# Patient Record
Sex: Male | Born: 1940
Health system: Southern US, Community
[De-identification: ages and names within clinical notes are randomized; demographics above are authoritative.]

## PROBLEM LIST (undated history)

## (undated) DIAGNOSIS — M5136 Other intervertebral disc degeneration, lumbar region: Secondary | ICD-10-CM

## (undated) DIAGNOSIS — F419 Anxiety disorder, unspecified: Secondary | ICD-10-CM

## (undated) DIAGNOSIS — E039 Hypothyroidism, unspecified: Secondary | ICD-10-CM

## (undated) DIAGNOSIS — I10 Essential (primary) hypertension: Secondary | ICD-10-CM

## (undated) DIAGNOSIS — K219 Gastro-esophageal reflux disease without esophagitis: Secondary | ICD-10-CM

## (undated) DIAGNOSIS — T4145XA Adverse effect of unspecified anesthetic, initial encounter: Secondary | ICD-10-CM

## (undated) DIAGNOSIS — F329 Major depressive disorder, single episode, unspecified: Secondary | ICD-10-CM

## (undated) DIAGNOSIS — R319 Hematuria, unspecified: Secondary | ICD-10-CM

## (undated) DIAGNOSIS — E785 Hyperlipidemia, unspecified: Secondary | ICD-10-CM

## (undated) DIAGNOSIS — R200 Anesthesia of skin: Secondary | ICD-10-CM

## (undated) DIAGNOSIS — F32A Depression, unspecified: Secondary | ICD-10-CM

## (undated) DIAGNOSIS — I8390 Asymptomatic varicose veins of unspecified lower extremity: Secondary | ICD-10-CM

## (undated) DIAGNOSIS — Z9109 Other allergy status, other than to drugs and biological substances: Secondary | ICD-10-CM

## (undated) DIAGNOSIS — Z8546 Personal history of malignant neoplasm of prostate: Secondary | ICD-10-CM

## (undated) DIAGNOSIS — M1990 Unspecified osteoarthritis, unspecified site: Secondary | ICD-10-CM

## (undated) DIAGNOSIS — Z85828 Personal history of other malignant neoplasm of skin: Secondary | ICD-10-CM

## (undated) DIAGNOSIS — H269 Unspecified cataract: Secondary | ICD-10-CM

## (undated) DIAGNOSIS — T8859XA Other complications of anesthesia, initial encounter: Secondary | ICD-10-CM

## (undated) DIAGNOSIS — R3915 Urgency of urination: Secondary | ICD-10-CM

## (undated) DIAGNOSIS — Z8551 Personal history of malignant neoplasm of bladder: Secondary | ICD-10-CM

## (undated) DIAGNOSIS — R351 Nocturia: Secondary | ICD-10-CM

## (undated) DIAGNOSIS — M419 Scoliosis, unspecified: Secondary | ICD-10-CM

## (undated) DIAGNOSIS — Z8719 Personal history of other diseases of the digestive system: Secondary | ICD-10-CM

## (undated) DIAGNOSIS — IMO0002 Reserved for concepts with insufficient information to code with codable children: Secondary | ICD-10-CM

## (undated) DIAGNOSIS — C61 Malignant neoplasm of prostate: Secondary | ICD-10-CM

## (undated) DIAGNOSIS — M51369 Other intervertebral disc degeneration, lumbar region without mention of lumbar back pain or lower extremity pain: Secondary | ICD-10-CM

## (undated) DIAGNOSIS — C4491 Basal cell carcinoma of skin, unspecified: Secondary | ICD-10-CM

## (undated) HISTORY — PX: EYE SURGERY: SHX253

## (undated) HISTORY — PX: HERNIA REPAIR: SHX51

## (undated) HISTORY — DX: Hypothyroidism, unspecified: E03.9

## (undated) HISTORY — PX: CATARACT EXTRACTION: SUR2

## (undated) HISTORY — PX: TONSILLECTOMY: SUR1361

## (undated) HISTORY — DX: Other allergy status, other than to drugs and biological substances: Z91.09

## (undated) HISTORY — PX: MASS BIOPSY: SHX5445

---
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
## 1979-06-16 HISTORY — PX: FOOT SURGERY: SHX648

## 1998-08-11 ENCOUNTER — Ambulatory Visit (HOSPITAL_COMMUNITY): Admission: RE | Admit: 1998-08-11 | Discharge: 1998-08-11 | Payer: Self-pay | Admitting: Internal Medicine

## 1998-08-11 ENCOUNTER — Encounter: Payer: Self-pay | Admitting: Internal Medicine

## 2002-10-15 DIAGNOSIS — C4491 Basal cell carcinoma of skin, unspecified: Secondary | ICD-10-CM

## 2002-10-15 HISTORY — PX: SKIN CANCER DESTRUCTION: SHX778

## 2002-10-15 HISTORY — DX: Basal cell carcinoma of skin, unspecified: C44.91

## 2002-10-15 HISTORY — PX: ESOPHAGOGASTRODUODENOSCOPY (EGD) WITH ESOPHAGEAL DILATION: SHX5812

## 2003-08-06 ENCOUNTER — Ambulatory Visit (HOSPITAL_COMMUNITY): Admission: RE | Admit: 2003-08-06 | Discharge: 2003-08-06 | Payer: Self-pay | Admitting: Internal Medicine

## 2003-08-06 ENCOUNTER — Encounter: Payer: Self-pay | Admitting: Internal Medicine

## 2005-03-05 ENCOUNTER — Ambulatory Visit: Payer: Self-pay | Admitting: Internal Medicine

## 2006-10-15 DIAGNOSIS — IMO0002 Reserved for concepts with insufficient information to code with codable children: Secondary | ICD-10-CM

## 2006-10-15 HISTORY — DX: Reserved for concepts with insufficient information to code with codable children: IMO0002

## 2007-02-25 ENCOUNTER — Ambulatory Visit: Payer: Self-pay | Admitting: Internal Medicine

## 2007-04-10 ENCOUNTER — Encounter: Payer: Self-pay | Admitting: Internal Medicine

## 2007-04-10 ENCOUNTER — Ambulatory Visit: Payer: Self-pay | Admitting: Internal Medicine

## 2008-08-31 ENCOUNTER — Ambulatory Visit: Admission: RE | Admit: 2008-08-31 | Discharge: 2008-10-12 | Payer: Self-pay | Admitting: Radiation Oncology

## 2008-10-15 HISTORY — PX: PROSTATECTOMY: SHX69

## 2008-10-25 ENCOUNTER — Inpatient Hospital Stay (HOSPITAL_COMMUNITY): Admission: RE | Admit: 2008-10-25 | Discharge: 2008-10-26 | Payer: Self-pay | Admitting: Urology

## 2008-10-25 ENCOUNTER — Encounter (INDEPENDENT_AMBULATORY_CARE_PROVIDER_SITE_OTHER): Payer: Self-pay | Admitting: Urology

## 2009-08-16 ENCOUNTER — Ambulatory Visit: Payer: Self-pay | Admitting: Vascular Surgery

## 2009-10-15 HISTORY — PX: VEIN SURGERY: SHX48

## 2009-10-19 ENCOUNTER — Ambulatory Visit: Payer: Self-pay | Admitting: Vascular Surgery

## 2009-12-16 ENCOUNTER — Ambulatory Visit: Payer: Self-pay | Admitting: Vascular Surgery

## 2010-01-04 ENCOUNTER — Ambulatory Visit: Payer: Self-pay | Admitting: Vascular Surgery

## 2010-01-11 ENCOUNTER — Ambulatory Visit: Payer: Self-pay | Admitting: Vascular Surgery

## 2010-07-24 ENCOUNTER — Ambulatory Visit: Payer: Self-pay | Admitting: Vascular Surgery

## 2011-01-29 LAB — BASIC METABOLIC PANEL
BUN: 10 mg/dL (ref 6–23)
CO2: 30 mEq/L (ref 19–32)
Calcium: 9.1 mg/dL (ref 8.4–10.5)
Chloride: 96 mEq/L (ref 96–112)
Creatinine, Ser: 0.98 mg/dL (ref 0.4–1.5)
GFR calc Af Amer: >60 mL/min (ref >60)
GFR calc non Af Amer: >60 mL/min (ref >60)
Glucose, Bld: 66 mg/dL — ABNORMAL LOW (ref 70–99)
Potassium: 4 mEq/L (ref 3.5–5.1)
Sodium: 134 mEq/L — ABNORMAL LOW (ref 135–145)

## 2011-01-29 LAB — HEMOGLOBIN AND HEMATOCRIT, BLOOD
HCT: 35.8 % — ABNORMAL LOW (ref 39.0–52.0)
HCT: 40.1 % (ref 39.0–52.0)
Hemoglobin: 12.2 g/dL — ABNORMAL LOW (ref 13.0–17.0)
Hemoglobin: 13.8 g/dL (ref 13.0–17.0)

## 2011-02-27 NOTE — Consult Note (Signed)
NEW PATIENT CONSULTATION   Ronald Lowery, Ronald Lowery  DOB:  March 16, 1941                                       10/19/2009  ZOXWR#:[id number]   The patient presents today for evaluation of venous hypertension.  He is  a very pleasant, active, healthy 70 year old gentleman with  progressively severe discomfort from his knees distally bilaterally.  He  reports this occurs towards the end of the day especially with prolonged  standing and prolonged sitting.  He has had a long history of extensive  varicose veins, more so on his left leg than his right leg and reports  that he does have some discomfort over this as well.  He had an episode  of superficial thrombophlebitis over the medial aspect of his knee  approximately 6 weeks ago and this is resolving.  He reports that the  pain is somewhat relieved with elevation and is worse with prolonged  standing.  It is knees both down.   PAST MEDICAL HISTORY:  Negative for any major medical difficulties,  specifically no diabetes, hypertension or cardiac disease.   FAMILY HISTORY:  Positive for varicosities in his mother who had prior  surgical treatment.  She is 68 years old.   SOCIAL HISTORY:  He is married with two children.  He is a retired  Airline pilot.  He does not smoke cigarettes and does not drink alcohol.   REVIEW OF SYSTEMS:  He does have a change in weight or loss of appetite.  His weight is 170 pounds.  He has 6 feet 3 inches tall.  No cardiac or  pulmonary dysfunction.  GI is positive for reflux, hiatal hernia.  GU  did have robotic prostate resection a year ago.  Vascular is no report  of pain from his legs distally.  Neurologic negative.  Ortho is positive  for arthritic joint pain.  Psychiatric is positive for depression.  HEENT is negative.  Heme is negative.   PHYSICAL EXAM:  A well-developed thin white male appearing stated age.  His blood pressure is 124/79, pulse 59, respirations 18. His radial and  dorsalis  pedis pulse are 2+ bilaterally.  HEENT is atraumatic,  normocephalic.  Extraocular movements are intact.  He does have no  evidence of skin changes over the lower extremities, specifically no  rash.  He does have marked varicose veins bilaterally, more so on the  left leg from the knee distally.  He does not have any evidence of  venous ulcers.   He underwent venous duplex in our office and this revealed reflux  throughout his saphenous vein bilaterally on images with handheld  Doppler.  He does have flow in his great saphenous vein into the large  varicosities at the level of his left knee.  Fortunately his deep system  has no evidence of reflux.  He has been wearing compression garments for  approximately 6 weeks.  He reports some degree of difficulty in wearing  these and placing these but he does report some improvement in his calf.  I discussed options with the patient.  I have recommended that he  continue with his compression garments.  We plan to see him again in 6  weeks for continued followup.  I did discuss the option of laser  ablation of his refluxing great saphenous vein and stab phlebectomy of  tributary varicosities for  relief of symptoms.  He understands.  We will  discuss this further with him in the future.  Plan to see him again in 6  weeks.     Larina Earthly, M.D.  Electronically Signed   TFE/MEDQ  D:  10/19/2009  T:  10/20/2009  Job:  [id number]   cc:   Geoffry Paradise, M.D.

## 2011-02-27 NOTE — Assessment & Plan Note (Signed)
OFFICE VISIT   Ronald Lowery, Ronald Lowery  DOB:  1940/10/31                                       01/11/2010  ZOXWR#:[id number]   The patient presents today for followup of his laser ablation of left  great saphenous vein with stab phlebectomy of multiple tributary  varicosities throughout his thigh, calf and ankle.  He did extremely  well with the procedure and has had minimal discomfort associated with  this.  He does have mild bruising as anticipated and all his phlebectomy  sites look quite good.  He underwent repeat venous duplex today and this  confirms closure of his saphenous vein from his knee to saphenofemoral  junction and no evidence of DVT.  I am quite pleased with his initial  result as is the patient and we plan to see him again on an as-needed  basis.     Larina Earthly, M.D.  Electronically Signed   TFE/MEDQ  D:  01/11/2010  T:  01/12/2010  Job:  [id number]   cc:   Geoffry Paradise, M.D.

## 2011-02-27 NOTE — Procedures (Signed)
DUPLEX DEEP VENOUS EXAM - LOWER EXTREMITY   INDICATION:  Follow up left greater saphenous vein ablation.   HISTORY:  Edema:  Yes.  Trauma/Surgery:  Left greater saphenous vein ablation.  Pain:  Yes.  PE:  No.  Previous DVT:  No.  Anticoagulants:  No.  Other:   DUPLEX EXAM:                CFV   SFV   PopV  PTV    GSV                R  L  R  L  R  L  R   L  R  L  Thrombosis    o  o     o     o      o     +  Spontaneous   +  +     +     +      +     o  Phasic        +  +     +     +      +     o  Augmentation  +  +     +     +      +     o  Compressible  +  +     +     +      +     o  Competent     +  +     +     +      +     o   Legend:  + - yes  o - no  p - partial  D - decreased   IMPRESSION:  There does not appear to be any deep vein thrombus noted in  the left leg.  The left greater saphenous vein appears ablated  proximally to the knee level.    _____________________________  Larina Earthly, M.D.   CB/MEDQ  D:  01/11/2010  T:  01/11/2010  Job:  [id number]

## 2011-02-27 NOTE — Assessment & Plan Note (Signed)
OFFICE VISIT   Ronald Lowery, Ronald Lowery  DOB:  1941/10/11                                       01/04/2010  XBMWU#:[id number]   The patient presents today for laser ablation of his left great  saphenous vein and stab phlebectomy of greater than 20 tributary  varicosities in his thigh, calf, and ankle.  He had no immediate  complication and will be seen again in 1 week with repeat venous duplex.     Larina Earthly, M.D.  Electronically Signed   TFE/MEDQ  D:  01/04/2010  T:  01/05/2010  Job:  [id number]

## 2011-02-27 NOTE — Assessment & Plan Note (Signed)
[facility]                         GASTROENTEROLOGY OFFICE NOTE   Ronald Lowery, Ronald Lowery                      MRN:          [id number]  DATE:02/25/2007                            DOB:          09/19/1941    REFERRING PHYSICIAN:  Geoffry Paradise, M.D.   REASON FOR CONSULTATION:  1. Epigastric pain.  2. Surveillance colonoscopy.  3. Dysphagia.  4. Symptomatic hemorrhoids.   HISTORY OF PRESENT ILLNESS:  This is a 70 year old white male with a  history of bipolar illness, hyperlipidemia, hypothyroidism,  osteoarthritis, anxiety/depression, gastroesophageal reflux disease.  He  also has a history of epigastric pain without organic basis.  He was  most recently seen in May 2006 for thrombosed hemorrhoid.  He presents  today regarding the above listed issues.  First, the patient reports a  several month history of problems with epigastric discomfort.  This is  similar to problems he has had in the past.  It seems to be exacerbated  by change in body position or bending.  Food does not affect the  discomfort.  No nausea or vomiting.  The patient takes Protonix b.i.d.  for reflux.  No classic symptoms on medications.  He does have a history  of distal esophageal stricture for which he has undergone dilation in  the past with relief of dysphagia.  Recently, he has noticed some  recurrent solid food dysphagia which he describes as mild.  He also has  a history of colon polyps and last underwent colonoscopy in May 2003.  One diminutive polyp was removed and found to be hyperplastic.  Follow  up in five years recommended.  Finally, the patient has had some  intermittent problems with hemorrhoids for which he takes Anusol.  Recently, he has increased his fiber intake and has found this to be  helpful.   PAST MEDICAL HISTORY:  As above.   ALLERGIES:  No known drug allergies.   CURRENT MEDICATIONS:  1. Synthroid 0.15 mg daily.  2. Rhinocort.  3. Protonix  40 mg b.i.d.  4. Ibuprofen 600 mg daily.  5. Gabapentin 300 mg at night.  6. Alprazolam 0.25 mg p.r.n.  7. Ambien 10 mg at night.  8. Levitra 10 mg p.r.n.  9. Apple vinegar.  10.Calcium.  11.Vitamin C.  12.Megavitamin.  13.Coenzyme Q-10.  14.Natural laxative.  15.Ocean saline spray.  16.Additional gabapentin p.r.n.  17.Sudafed p.r.n.  18.TheraSol.   FAMILY/SOCIAL HISTORY:  As outlined in prior consultation notes.   PHYSICAL EXAMINATION:  GENERAL:  Well-appearing male in no acute  distress.  He is alert and oriented.  VITAL SIGNS:  Blood pressure 118/76, heart rate 62, weight 180.2 pounds.  HEENT:  Sclerae are anicteric.  Conjunctivae is pink.  Oral mucosa is  intact.  No adenopathy.  LUNGS:  Clear.  HEART:  Regular.  ABDOMEN:  Soft with complaints of tenderness to palpation in the  epigastric region.  No mass felt.  Good bowel sounds heard.  No  organomegaly.  EXTREMITIES:  Without edema.  RECTAL:  Noninflammed external hemorrhoids.   IMPRESSION:  1. Gastroesophageal reflux disease.  Classic symptoms controlled with  Protonix.  Mild recurrent dysphagia, likely due to recurrent      stricture.  2. Recurrent epigastric pain, similar to pain in the past, suspect      functional.  3. History of colon polyps, due for follow up screening colonoscopy.  4. Intermittent symptomatic hemorrhoids.   RECOMMENDATIONS:  1. Schedule upper endoscopy with probable esophageal dilation.  Ashby Dawes      of the procedure as well as the risks, benefits and alternatives      have been reviewed.  He understood and agreed to proceed.  2. Continue proton pump inhibitor therapy.  Prescription with multiple      refills has been provided.  3. Continue fiber and Anusol for hemorrhoids.  4. Schedule colonoscopy with polypectomy as needed.  The nature of the      procedure as well as the risks, benefits and alternatives have been      reviewed.  He understood and agreed to proceed.  5. Ongoing  general medical care with Dr. Jacky Kindle.     Wilhemina Bonito. Marina Goodell, MD  Electronically Signed    JNP/MedQ  DD: 02/25/2007  DT: 02/26/2007  Job #: [id number]   cc:   Geoffry Paradise, M.D.

## 2011-02-27 NOTE — Discharge Summary (Signed)
NAMEBERGEN, MAGNER               ACCOUNT NO.:  [id number]   MEDICAL RECORD NO.:  [id number]          PATIENT TYPE:  INP   LOCATION:  1412                         [facility]ACILITY[facility]   PHYSICIAN:  Heloise Purpura, MD      DATE OF BIRTH:  08/26/1941   DATE OF ADMISSION:  10/25/2008  DATE OF DISCHARGE:  10/26/2008                               DISCHARGE SUMMARY   ADMISSION DIAGNOSIS:  Clinically localized adenocarcinoma of the  prostate, T1c-N0-M0.   DISCHARGE DIAGNOSIS:  Clinically localized adenocarcinoma of the  prostate, T1c-N0-M0.   PROCEDURES:  1. Robotic-assisted laparoscopic radical prostatectomy (bilateral      nerve sparing).  2. Bilateral pelvic lymphadenectomy.   HISTORY AND PHYSICAL:  For full details, please see admission history  and physical.  Briefly, [name] is a 70 year old gentleman who was  found to have clinically localized adenocarcinoma of the prostate.  After careful consideration regarding management options for treatment,  he elected to proceed with surgical therapy and  a robotic-assisted  laparoscopic radical prostatectomy.   HOSPITAL COURSE:  On October 25, 2008, he was taken to the operating  room and underwent a robotic-assisted laparoscopic radical prostatectomy  which he tolerated well and without complications.  Postoperatively, he  was able to be transferred to a regular hospital room, following  recovery from anesthesia.  He was able to begin ambulation that evening.  He remained hemodynamically stable.  His postoperative hematocrit was  35.8.  On the morning of postoperative day #1 his hematocrit was also  found to be stable at 36.1.  He maintained excellent urine output with a  minimal output from his pelvic drain; therefore, the pelvic drain was  removed.  He was placed on a clear liquid diet and continued to  ambulate.  He was re-evaluated on the afternoon of postoperative day #1.  His urine output, blood pressure, and pulse all have  remained stable.  He was also able to tolerate his clear liquid diet without nausea or  vomiting.  Therefore, he was felt to be stable for discharge and having  met all discharge criteria.   DISPOSITION:  Home.   DISCHARGE MEDICATIONS:  He was instructed to resume his regular home  medications.  In addition, he was provided a prescription for  Vicodin  to take as needed for pain and told to use Colace as a stool softener.  He was also given a prescription for Cipro to begin 1 day prior to  return visit for Foley catheter removal.  He was also instructed to hold  aspirin, herbal supplements and multivitamins for 10 days.   DISCHARGE INSTRUCTIONS:  He was instructed to be ambulatory but  specifically told to refrain from any heavy lifting, strenuous activity  or driving.  He was also instructed on routine Foley catheter care and  told to gradually advance his diet over the course of the next few days.   FOLLOWUP:  He will follow up in 1 week for Foley catheter removal and  skin staple removal.      Delia Chimes, NP      Konrad Dolores  Laverle Patter, MD  Electronically Signed    [name]  D:  10/26/2008  T:  10/26/2008  Job:  [id number]

## 2011-02-27 NOTE — Op Note (Signed)
Ronald Lowery, Ronald Lowery               ACCOUNT NO.:  [id number]   MEDICAL RECORD NO.:  [id number]          PATIENT TYPE:  INP   LOCATION:  1412                         FACILITY:  [facility]   PHYSICIAN:  Heloise Purpura, MD      DATE OF BIRTH:  June 25, 1941   DATE OF PROCEDURE:  10/25/2008  DATE OF DISCHARGE:                               OPERATIVE REPORT   PREOPERATIVE DIAGNOSIS:  Clinically localized adenocarcinoma of the  prostate.   POSTOPERATIVE DIAGNOSIS:  Clinically localized adenocarcinoma of the  prostate.   PROCEDURES:  1. Robotic assisted laparoscopic radical prostatectomy (bilateral      nerve sparing).  2. Bilateral laparoscopic pelvic lymphadenectomy.   SURGEON:  Dr. Heloise Purpura.   ASSISTANTS:  Delia Chimes, nurse practitioner (1st assistant) and Dr.  Duane Boston (2nd assistant).   ANESTHESIA:  General.   COMPLICATIONS:  None.   ESTIMATED BLOOD LOSS:  50 mL.   INTRAVENOUS FLUIDS:  1500 mL of Lactated Ringer's.   SPECIMENS:  1. Prostate and seminal vesicles.  2. Right pelvic lymph nodes.  3. Left pelvic lymph nodes.   DISPOSITION:  Specimens to pathology.   DRAINS:  1. 20-French coude catheter.  2. #19 Blake pelvic drain.   INDICATIONS:  [name] is a 70 year old gentleman with clinically  localized adenocarcinoma of the prostate.  After an in-depth discussion  regarding the management options for treatment, he elected to proceed  with surgical therapy and the above procedures.  The potential risks,  complications, and alternative options were discussed in detail and  informed consent was obtained.   DESCRIPTION OF PROCEDURE:  The patient was taken to the operating room  and a general anesthetic was administered.  He was given preoperative  antibiotics, placed in the dorsal lithotomy position, and prepped and  draped in the usual sterile fashion.  Next, a preoperative timeout was  performed.  A Foley catheter was then inserted into the bladder and a  site was selected just inferior to the umbilicus for placement of the  camera port.  This was placed using a standard open Hassan technique  which allowed entry into the peritoneal cavity under direct vision and  without difficulty.  A 12 mm port was then placed and a pneumoperitoneum  established.  The 0 degree lens was used to inspect the abdomen and  there was no evidence for any intra-abdominal injuries or other  abnormalities.  The remaining ports were then placed.  Bilateral 8 mm  robotic ports were placed 10 cm lateral to and just inferior to the  camera port site.  An additional 8 mm robotic port was placed in the far  left lateral abdominal wall.  A 5 mm port was placed between the camera  port and the right robotic port.  An additional 12 mm port was placed in  the far right lateral abdominal wall for laparoscopic assistance.  All  ports were placed under direct vision and without difficulty.  The  surgical cart was then docked.  With the aid of the cautery scissors,  the bladder was reflected posteriorly allowing entry into the  space of  Retzius and identification of the endopelvic fascia and prostate.  The  endopelvic fascia was then incised from the apex back to the base of the  prostate bilaterally and the underlying levator muscle fibers were swept  laterally off the prostate, thereby isolating the dorsal venous complex.  The dorsal vein was then stapled and divided with a 45 mm flex ETS  stapler.  The bladder neck was identified with the aid of Foley catheter  manipulation and was divided anteriorly, thereby exposing the Foley  catheter.  The catheter balloon was deflated and the catheter was  brought in into the operative field and used to retract the prostate  anteriorly.  The posterior bladder neck was then divided and dissection  proceeded between the bladder and prostate until the vasa deferentia and  seminal vesicles were identified.  The vasa deferentia were  isolated,  divided and lifted anteriorly.  The seminal vesicles were then dissected  down to their tips with care to control the seminal vesicle arterial  blood supply.  The seminal vesicles were then lifted anteriorly and the  space between Denonvilliers fascia and the anterior rectum was bluntly  developed, thereby isolating the vascular pedicles of the prostate.  The  lateral prostatic fascia was incised sharply bilaterally allowing the  neurovascular bundles to be released.  The vascular pedicles of the  prostate were then ligated with Hem-o-lok clips above the level of the  neurovascular bundles and the neurovascular bundles were separated off  the apex of the prostate and urethra.  The urethra was sharply divided  allowing the specimen to be disarticulated.  The pelvis was then  copiously irrigated and hemostasis was ensured.  Attention then turned  to the right pelvic sidewall.  The fibrofatty tissue between the  external iliac vein, confluence of the iliac vessels, hypogastric  artery, and Cooper's ligament was dissected free off the pelvic sidewall  with care to preserve the obturator nerve and obturator vessels.  Hem-o-  lok clips were used for lymphostasis and hemostasis.  The specimen was  then passed off for permanent pathologic analysis.  An identical  procedure was then performed on the contralateral side.  Attention then  turned to the urethral anastomosis.  A 2-0 Vicryl slip-knot was placed  between Denonvilliers fascia, the posterior bladder neck and the  posterior urethra to reapproximate these structures.  A double-armed 3-0  Monocryl suture was then used to perform a 360 degree running tension-  free anastomosis between the bladder neck and urethra.  A new 20-French  coude catheter was inserted into the bladder and irrigated.  The  anastomosis appeared to be watertight and there were no blood clots  within the bladder.  A #19 Blake drain was then brought through the  left  robotic port and appropriately positioned in the pelvis.  It was secured  to skin with a nylon suture.  The surgical cart was then undocked.  The  right lateral 12 mm port site was closed with a 0 Vicryl suture placed  with the aid of the suture passer device.  All other ports were removed  under direct vision.  The prostate specimen was removed intact within  the Endopouch retrieval bag via the periumbilical port site.  This  fascial opening was then closed with a running 0 Vicryl suture.  All  port sites were then injected with quarter-percent Marcaine and  reapproximated at the skin level with staples.  Sterile dressings were  applied.  He  appeared to tolerate the procedure well and without  complications.  He was able to be extubated and transferred to the  recovery unit in satisfactory condition.      Heloise Purpura, MD  Electronically Signed     LB/MEDQ  D:  10/25/2008  T:  10/25/2008  Job:  [id number]

## 2011-02-27 NOTE — Procedures (Signed)
LOWER EXTREMITY VENOUS REFLUX EXAM   INDICATION:  Hard varicose veins.   EXAM:  Using color-flow imaging and pulse Doppler spectral analysis, the  bilateral common femoral, superficial femoral, popliteal, posterior  tibial, greater and lesser saphenous veins are evaluated.  There is no  evidence suggesting deep venous insufficiency in the bilateral lower  extremities.   The left saphenofemoral junction is not competent with Reflux of  >517milliseconds. The bilateral GSV's are not competent with Reflux of  >520milliseconds with the caliber as described below.   The right proximal short saphenous vein demonstrates incompetency.   GSV Diameter (used if found to be incompetent only)                                            Right    Left  Proximal Greater Saphenous Vein           cm       cm  Proximal-to-mid-thigh                     0.39 cm  0.44 cm  Mid thigh                                 0.40 cm  0.46 cm  Mid-distal thigh                          cm       cm  Distal thigh                              0.25 cm  0.51 cm  Knee                                      cm       0.34 cm   LESSER SAPHENOUS VEIN DIAMETERS:   PROXIMAL RIGHT:  0.29   MID:  0.28     IMPRESSION:  1. Bilateral greater saphenous vein Reflux with >517milliseconds is      identified with the caliber ranging from 0.25 cm to 0.51 cm knee to      groin.  2. The bilateral greater saphenous veins are not aneurysmal.  3. The bilateral greater saphenous veins are not tortuous.  4. The deep venous system is competent.  5. The right lesser saphenous vein is incompetent with Reflux of      >588milliseconds.   ___________________________________________  Larina Earthly, M.D.   CB/MEDQ  D:  10/19/2009  T:  10/19/2009  Job:  [id number]

## 2011-02-27 NOTE — Assessment & Plan Note (Signed)
OFFICE VISIT   AUBURN, HERT  DOB:  [birth year]                                       12/16/2009  ZOXWR#:[id number]   The patient presents today for continued followup of his bilateral  venous hypertension.  He reports that he has continued to have  difficulty and pain despite the use of compression garments.  He has  worn these for over 3 months and does elevate his legs when possible and  also takes ibuprofen for discomfort.  He states that he is unable to  walk for exercise, had to stop playing tennis due to leg pain.  Prolonged sitting especially when driving a car is difficult due to leg  pain.  He travels frequently, caring for his elderly mother in  [place].  He also states he is unable to do yard work and house work  due to severe leg pain with prolonged standing.  He was initially fitted  for compression garments on August 16, 2009.   PHYSICAL EXAMINATION:  Unchanged.  He has marked surface varicosities  bilaterally from his knees distally.   He did undergo re-imaging with ultrasound of his saphenous veins, and  these do show severe reflux in his saphenous veins with enlargement  bilaterally, and these are leading into his varicosities.  I have  recommended staged bilateral laser ablation of his great saphenous veins  bilaterally and stab phlebectomy at the same setting bilaterally.  I  explained the procedure, including potential risks of nonclosure of the  vein and also possible rare complication of deep venous thrombosis.  He  understands and wishes to proceed with the procedure as soon as  possible.     Larina Earthly, M.D.  Electronically Signed   TFE/MEDQ  D:  12/16/2009  T:  12/19/2009  Job:  [id number]   cc:   Geoffry Paradise, M.D.

## 2011-10-25 ENCOUNTER — Other Ambulatory Visit: Payer: Self-pay | Admitting: Dermatology

## 2011-10-25 DIAGNOSIS — L570 Actinic keratosis: Secondary | ICD-10-CM | POA: Diagnosis not present

## 2011-10-25 DIAGNOSIS — C44320 Squamous cell carcinoma of skin of unspecified parts of face: Secondary | ICD-10-CM | POA: Diagnosis not present

## 2011-10-25 DIAGNOSIS — L259 Unspecified contact dermatitis, unspecified cause: Secondary | ICD-10-CM | POA: Diagnosis not present

## 2011-10-25 DIAGNOSIS — D0439 Carcinoma in situ of skin of other parts of face: Secondary | ICD-10-CM | POA: Diagnosis not present

## 2011-11-02 DIAGNOSIS — C61 Malignant neoplasm of prostate: Secondary | ICD-10-CM | POA: Diagnosis not present

## 2011-11-20 DIAGNOSIS — C61 Malignant neoplasm of prostate: Secondary | ICD-10-CM | POA: Diagnosis not present

## 2011-11-20 DIAGNOSIS — N529 Male erectile dysfunction, unspecified: Secondary | ICD-10-CM | POA: Diagnosis not present

## 2011-11-20 DIAGNOSIS — R350 Frequency of micturition: Secondary | ICD-10-CM | POA: Diagnosis not present

## 2012-01-29 DIAGNOSIS — F329 Major depressive disorder, single episode, unspecified: Secondary | ICD-10-CM | POA: Diagnosis not present

## 2012-01-29 DIAGNOSIS — J309 Allergic rhinitis, unspecified: Secondary | ICD-10-CM | POA: Diagnosis not present

## 2012-01-29 DIAGNOSIS — E039 Hypothyroidism, unspecified: Secondary | ICD-10-CM | POA: Diagnosis not present

## 2012-01-29 DIAGNOSIS — E785 Hyperlipidemia, unspecified: Secondary | ICD-10-CM | POA: Diagnosis not present

## 2012-01-30 DIAGNOSIS — S0500XA Injury of conjunctiva and corneal abrasion without foreign body, unspecified eye, initial encounter: Secondary | ICD-10-CM | POA: Diagnosis not present

## 2012-02-20 DIAGNOSIS — H2510 Age-related nuclear cataract, unspecified eye: Secondary | ICD-10-CM | POA: Diagnosis not present

## 2012-05-01 DIAGNOSIS — Z85828 Personal history of other malignant neoplasm of skin: Secondary | ICD-10-CM | POA: Diagnosis not present

## 2012-05-01 DIAGNOSIS — D239 Other benign neoplasm of skin, unspecified: Secondary | ICD-10-CM | POA: Diagnosis not present

## 2012-05-01 DIAGNOSIS — L821 Other seborrheic keratosis: Secondary | ICD-10-CM | POA: Diagnosis not present

## 2012-05-30 DIAGNOSIS — C61 Malignant neoplasm of prostate: Secondary | ICD-10-CM | POA: Diagnosis not present

## 2012-06-06 DIAGNOSIS — C61 Malignant neoplasm of prostate: Secondary | ICD-10-CM | POA: Diagnosis not present

## 2012-06-06 DIAGNOSIS — N529 Male erectile dysfunction, unspecified: Secondary | ICD-10-CM | POA: Diagnosis not present

## 2012-07-14 DIAGNOSIS — E785 Hyperlipidemia, unspecified: Secondary | ICD-10-CM | POA: Diagnosis not present

## 2012-07-14 DIAGNOSIS — Z79899 Other long term (current) drug therapy: Secondary | ICD-10-CM | POA: Diagnosis not present

## 2012-07-14 DIAGNOSIS — E039 Hypothyroidism, unspecified: Secondary | ICD-10-CM | POA: Diagnosis not present

## 2012-07-14 DIAGNOSIS — Z125 Encounter for screening for malignant neoplasm of prostate: Secondary | ICD-10-CM | POA: Diagnosis not present

## 2012-07-21 ENCOUNTER — Encounter: Payer: Self-pay | Admitting: Internal Medicine

## 2012-07-21 DIAGNOSIS — Z0000 Encounter for general adult medical examination without abnormal findings: Secondary | ICD-10-CM | POA: Diagnosis not present

## 2012-07-21 DIAGNOSIS — E039 Hypothyroidism, unspecified: Secondary | ICD-10-CM | POA: Diagnosis not present

## 2012-07-21 DIAGNOSIS — Z23 Encounter for immunization: Secondary | ICD-10-CM | POA: Diagnosis not present

## 2012-07-21 DIAGNOSIS — Z125 Encounter for screening for malignant neoplasm of prostate: Secondary | ICD-10-CM | POA: Diagnosis not present

## 2012-07-21 DIAGNOSIS — E785 Hyperlipidemia, unspecified: Secondary | ICD-10-CM | POA: Diagnosis not present

## 2012-07-21 DIAGNOSIS — F329 Major depressive disorder, single episode, unspecified: Secondary | ICD-10-CM | POA: Diagnosis not present

## 2012-07-22 DIAGNOSIS — Z1212 Encounter for screening for malignant neoplasm of rectum: Secondary | ICD-10-CM | POA: Diagnosis not present

## 2012-09-01 ENCOUNTER — Ambulatory Visit (AMBULATORY_SURGERY_CENTER): Payer: Medicare Other

## 2012-09-01 VITALS — Ht 75.5 in | Wt 168.0 lb

## 2012-09-01 DIAGNOSIS — Z1211 Encounter for screening for malignant neoplasm of colon: Secondary | ICD-10-CM

## 2012-09-01 MED ORDER — MOVIPREP 100 G PO SOLR: 1.0000 | Freq: Once | ORAL | Status: DC

## 2012-09-09 ENCOUNTER — Telehealth: Payer: Self-pay | Admitting: Internal Medicine

## 2012-09-09 DIAGNOSIS — IMO0002 Reserved for concepts with insufficient information to code with codable children: Secondary | ICD-10-CM | POA: Diagnosis not present

## 2012-09-09 NOTE — Telephone Encounter (Signed)
Talked with pt:  Pt will call PCP ( Dr Jacky Kindle) today to see if he can be seen in office today.  If he cannot be seen today; pt will cancel colonoscopy until after office visit with Dr. Jacky Kindle.

## 2012-09-10 NOTE — Telephone Encounter (Signed)
Talked with pt today.  He saw Dr. Wylene Simmer in office yesterday and was cleared to have colnoscopy.  Pt says that he"was having muscle spasm and did not have a hernia."

## 2012-09-15 ENCOUNTER — Encounter: Payer: Self-pay | Admitting: Internal Medicine

## 2012-09-15 ENCOUNTER — Ambulatory Visit (AMBULATORY_SURGERY_CENTER): Payer: Medicare Other | Admitting: Internal Medicine

## 2012-09-15 VITALS — BP 125/65 | HR 48 | Temp 98.0°F | Resp 18 | Ht 75.5 in | Wt 168.0 lb

## 2012-09-15 DIAGNOSIS — D126 Benign neoplasm of colon, unspecified: Secondary | ICD-10-CM

## 2012-09-15 DIAGNOSIS — Z1211 Encounter for screening for malignant neoplasm of colon: Secondary | ICD-10-CM | POA: Diagnosis not present

## 2012-09-15 DIAGNOSIS — Z86010 Personal history of colonic polyps: Secondary | ICD-10-CM

## 2012-09-15 DIAGNOSIS — E079 Disorder of thyroid, unspecified: Secondary | ICD-10-CM | POA: Diagnosis not present

## 2012-09-15 MED ORDER — SODIUM CHLORIDE 0.9 % IV SOLN: 500.0000 mL | INTRAVENOUS | Status: DC

## 2012-09-15 NOTE — Progress Notes (Signed)
Abdominal pressure applied by tech to help advance scope to cecum per Md request. ewm

## 2012-09-15 NOTE — Patient Instructions (Addendum)

## 2012-09-15 NOTE — Progress Notes (Signed)
Patient did not have preoperative order for IV antibiotic SSI prophylaxis. (G8918)  Patient did not experience any of the following events: a burn prior to discharge; a fall within the facility; wrong site/side/patient/procedure/implant event; or a hospital transfer or hospital admission upon discharge from the facility. (G8907)  

## 2012-09-15 NOTE — Progress Notes (Deleted)
YOU HAD AN ENDOSCOPIC PROCEDURE TODAY AT [facility]: Refer to the procedure report that was given to you for any specific questions about what was found during the examination.  If the procedure report does not answer your questions, please call your gastroenterologist to clarify.  If you requested that your care partner not be given the details of your procedure findings, then the procedure report has been included in a sealed envelope for you to review at your convenience later.  YOU SHOULD EXPECT: Some feelings of bloating in the abdomen. Passage of more gas than usual.  Walking can help get rid of the air that was put into your GI tract during the procedure and reduce the bloating. If you had a lower endoscopy (such as a colonoscopy or flexible sigmoidoscopy) you may notice spotting of blood in your stool or on the toilet paper. If you underwent a bowel prep for your procedure, then you may not have a normal bowel movement for a few days.  DIET: Your first meal following the procedure should be a light meal and then it is ok to progress to your normal diet.  A half-sandwich or bowl of soup is an example of a good first meal.  Heavy or fried foods are harder to digest and may make you feel nauseous or bloated.  Likewise meals heavy in dairy and vegetables can cause extra gas to form and this can also increase the bloating.  Drink plenty of fluids but you should avoid alcoholic beverages for 24 hours.  ACTIVITY: Your care partner should take you home directly after the procedure.  You should plan to take it easy, moving slowly for the rest of the day.  You can resume normal activity the day after the procedure however you should NOT DRIVE or use heavy machinery for 24 hours (because of the sedation medicines used during the test).    SYMPTOMS TO REPORT IMMEDIATELY: A gastroenterologist can be reached at any hour.  During normal business hours, 8:30 AM to 5:00 PM Monday through Friday,  call [phone].  After hours and on weekends, please call the GI answering service at [phone] who will take a message and have the physician on call contact you.   Following lower endoscopy (colonoscopy or flexible sigmoidoscopy):  Excessive amounts of blood in the stool  Significant tenderness or worsening of abdominal pains  Swelling of the abdomen that is new, acute  Fever of 100F or higher   FOLLOW UP: If any biopsies were taken you will be contacted by phone or by letter within the next 1-3 weeks.  Call your gastroenterologist if you have not heard about the biopsies in 3 weeks.  Our staff will call the home number listed on your records the next business day following your procedure to check on you and address any questions or concerns that you may have at that time regarding the information given to you following your procedure. This is a courtesy call and so if there is no answer at the home number and we have not heard from you through the emergency physician on call, we will assume that you have returned to your regular daily activities without incident.  SIGNATURES/CONFIDENTIALITY: You and/or your care partner have signed paperwork which will be entered into your electronic medical record.  These signatures attest to the fact that that the information above on your After Visit Summary has been reviewed and is understood.  Full responsibility of the confidentiality of  this discharge information lies with you and/or your care-partner.   Resume your normal medications  Read handout on polyps  Follow up colonoscopy in 3 or 5 years

## 2012-09-15 NOTE — Op Note (Signed)
[facility] [address] [place], [address]   COLONOSCOPY PROCEDURE REPORT  PATIENT: Ronald Lowery, Ronald Lowery  MR#: [id number] BIRTHDATE: 01-Mar-1941 , 71  yrs. old GENDER: Male ENDOSCOPIST: Roxy Cedar, MD REFERRED XB:[id number] Program Recall PROCEDURE DATE:  09/15/2012 PROCEDURE:   Colonoscopy with snare polypectomy x 5 ASA CLASS:   Class II INDICATIONS:Patient's personal history of adenomatous colon polyps. Right sided serrated polyps 2003, 2008 MEDICATIONS: MAC sedation, administered by CRNA and propofol (Diprivan) 400mg  IV  DESCRIPTION OF PROCEDURE:   After the risks benefits and alternatives of the procedure were thoroughly explained, informed consent was obtained.  A digital rectal exam revealed no abnormalities of the rectum.   The LB PCF-[id number]  endoscope was introduced through the anus and advanced to the cecum, which was identified by both the appendix and ileocecal valve. No adverse events experienced.   The quality of the prep was good, using MoviPrep  The instrument was then slowly withdrawn as the colon was fully examined.      COLON FINDINGS: Five sessile polyps ranging between 5-93mm in size were found at the cecum, in the ascending colon (3), and transverse colon.  A polypectomy was performed with a cold snare.  The resection was complete and the polyp tissue was completely retrieved.   The colon mucosa was otherwise normal.  Retroflexed views revealed internal hemorrhoids. The time to cecum=5 minutes 55 seconds.  Withdrawal time=18 minutes 40 seconds.  The scope was withdrawn and the procedure completed. COMPLICATIONS: There were no complications.  ENDOSCOPIC IMPRESSION: 1.   Five sessile polyps ranging between 5-39mm in size were found at the cecum, in the ascending colon, and transverse colon; polypectomy was performed with a cold snare 2.   The colon mucosa was otherwise normal  RECOMMENDATIONS: 1. Follow up colonoscopy in  3 or 5 years, pending path results   eSigned:  Roxy Cedar, MD 09/15/2012 11:45 AM   cc: Geoffry Paradise, MD and The Patient   PATIENT NAME:  Ronald Lowery, Ronald Lowery MR#: [id number]

## 2012-09-16 ENCOUNTER — Telehealth: Payer: Self-pay | Admitting: *Deleted

## 2012-09-16 NOTE — Telephone Encounter (Signed)
  Follow up Call-  Call back number 09/15/2012  Post procedure Call Back phone  # [phone]  Permission to leave phone message Yes     Patient questions:  Do you have a fever, pain , or abdominal swelling? no Pain Score  0 *  Have you tolerated food without any problems? yes  Have you been able to return to your normal activities? yes  Do you have any questions about your discharge instructions: Diet   no Medications  no Follow up visit  no  Do you have questions or concerns about your Care? no  Actions: * If pain score is 4 or above: No action needed, pain <4.

## 2012-09-18 ENCOUNTER — Encounter: Payer: Self-pay | Admitting: Internal Medicine

## 2012-12-04 DIAGNOSIS — B351 Tinea unguium: Secondary | ICD-10-CM | POA: Diagnosis not present

## 2012-12-22 DIAGNOSIS — C61 Malignant neoplasm of prostate: Secondary | ICD-10-CM | POA: Diagnosis not present

## 2012-12-26 DIAGNOSIS — N529 Male erectile dysfunction, unspecified: Secondary | ICD-10-CM | POA: Diagnosis not present

## 2012-12-26 DIAGNOSIS — C61 Malignant neoplasm of prostate: Secondary | ICD-10-CM | POA: Diagnosis not present

## 2013-04-30 ENCOUNTER — Other Ambulatory Visit: Payer: Self-pay | Admitting: Dermatology

## 2013-04-30 DIAGNOSIS — Z85828 Personal history of other malignant neoplasm of skin: Secondary | ICD-10-CM | POA: Diagnosis not present

## 2013-04-30 DIAGNOSIS — L570 Actinic keratosis: Secondary | ICD-10-CM | POA: Diagnosis not present

## 2013-04-30 DIAGNOSIS — L821 Other seborrheic keratosis: Secondary | ICD-10-CM | POA: Diagnosis not present

## 2013-04-30 DIAGNOSIS — D485 Neoplasm of uncertain behavior of skin: Secondary | ICD-10-CM | POA: Diagnosis not present

## 2013-05-12 DIAGNOSIS — Z85828 Personal history of other malignant neoplasm of skin: Secondary | ICD-10-CM | POA: Diagnosis not present

## 2013-05-12 DIAGNOSIS — L608 Other nail disorders: Secondary | ICD-10-CM | POA: Diagnosis not present

## 2013-07-23 DIAGNOSIS — E785 Hyperlipidemia, unspecified: Secondary | ICD-10-CM | POA: Diagnosis not present

## 2013-07-23 DIAGNOSIS — E039 Hypothyroidism, unspecified: Secondary | ICD-10-CM | POA: Diagnosis not present

## 2013-07-23 DIAGNOSIS — Z125 Encounter for screening for malignant neoplasm of prostate: Secondary | ICD-10-CM | POA: Diagnosis not present

## 2013-07-23 DIAGNOSIS — Z0000 Encounter for general adult medical examination without abnormal findings: Secondary | ICD-10-CM | POA: Diagnosis not present

## 2013-07-31 DIAGNOSIS — Z23 Encounter for immunization: Secondary | ICD-10-CM | POA: Diagnosis not present

## 2013-07-31 DIAGNOSIS — Z1331 Encounter for screening for depression: Secondary | ICD-10-CM | POA: Diagnosis not present

## 2013-07-31 DIAGNOSIS — C61 Malignant neoplasm of prostate: Secondary | ICD-10-CM | POA: Diagnosis not present

## 2013-07-31 DIAGNOSIS — Z125 Encounter for screening for malignant neoplasm of prostate: Secondary | ICD-10-CM | POA: Diagnosis not present

## 2013-07-31 DIAGNOSIS — K219 Gastro-esophageal reflux disease without esophagitis: Secondary | ICD-10-CM | POA: Diagnosis not present

## 2013-07-31 DIAGNOSIS — Z0000 Encounter for general adult medical examination without abnormal findings: Secondary | ICD-10-CM | POA: Diagnosis not present

## 2013-07-31 DIAGNOSIS — E039 Hypothyroidism, unspecified: Secondary | ICD-10-CM | POA: Diagnosis not present

## 2013-07-31 DIAGNOSIS — J309 Allergic rhinitis, unspecified: Secondary | ICD-10-CM | POA: Diagnosis not present

## 2013-07-31 DIAGNOSIS — I872 Venous insufficiency (chronic) (peripheral): Secondary | ICD-10-CM | POA: Diagnosis not present

## 2013-07-31 DIAGNOSIS — E785 Hyperlipidemia, unspecified: Secondary | ICD-10-CM | POA: Diagnosis not present

## 2013-08-03 DIAGNOSIS — Z1212 Encounter for screening for malignant neoplasm of rectum: Secondary | ICD-10-CM | POA: Diagnosis not present

## 2013-12-07 DIAGNOSIS — B351 Tinea unguium: Secondary | ICD-10-CM | POA: Diagnosis not present

## 2013-12-07 DIAGNOSIS — Z85828 Personal history of other malignant neoplasm of skin: Secondary | ICD-10-CM | POA: Diagnosis not present

## 2013-12-25 DIAGNOSIS — C61 Malignant neoplasm of prostate: Secondary | ICD-10-CM | POA: Diagnosis not present

## 2014-01-29 DIAGNOSIS — C61 Malignant neoplasm of prostate: Secondary | ICD-10-CM | POA: Diagnosis not present

## 2014-01-29 DIAGNOSIS — N529 Male erectile dysfunction, unspecified: Secondary | ICD-10-CM | POA: Diagnosis not present

## 2014-03-03 DIAGNOSIS — N509 Disorder of male genital organs, unspecified: Secondary | ICD-10-CM | POA: Diagnosis not present

## 2014-04-29 DIAGNOSIS — B351 Tinea unguium: Secondary | ICD-10-CM | POA: Diagnosis not present

## 2014-04-29 DIAGNOSIS — W57XXXA Bitten or stung by nonvenomous insect and other nonvenomous arthropods, initial encounter: Secondary | ICD-10-CM | POA: Diagnosis not present

## 2014-04-29 DIAGNOSIS — T148 Other injury of unspecified body region: Secondary | ICD-10-CM | POA: Diagnosis not present

## 2014-04-29 DIAGNOSIS — D239 Other benign neoplasm of skin, unspecified: Secondary | ICD-10-CM | POA: Diagnosis not present

## 2014-04-29 DIAGNOSIS — L821 Other seborrheic keratosis: Secondary | ICD-10-CM | POA: Diagnosis not present

## 2014-04-29 DIAGNOSIS — Z85828 Personal history of other malignant neoplasm of skin: Secondary | ICD-10-CM | POA: Diagnosis not present

## 2014-05-19 ENCOUNTER — Encounter: Payer: Self-pay | Admitting: Internal Medicine

## 2014-07-26 DIAGNOSIS — Z79899 Other long term (current) drug therapy: Secondary | ICD-10-CM | POA: Diagnosis not present

## 2014-07-26 DIAGNOSIS — E039 Hypothyroidism, unspecified: Secondary | ICD-10-CM | POA: Diagnosis not present

## 2014-07-26 DIAGNOSIS — Z125 Encounter for screening for malignant neoplasm of prostate: Secondary | ICD-10-CM | POA: Diagnosis not present

## 2014-07-26 DIAGNOSIS — E785 Hyperlipidemia, unspecified: Secondary | ICD-10-CM | POA: Diagnosis not present

## 2014-08-02 DIAGNOSIS — Z1212 Encounter for screening for malignant neoplasm of rectum: Secondary | ICD-10-CM | POA: Diagnosis not present

## 2014-08-02 DIAGNOSIS — Z23 Encounter for immunization: Secondary | ICD-10-CM | POA: Diagnosis not present

## 2014-08-02 DIAGNOSIS — Z0000 Encounter for general adult medical examination without abnormal findings: Secondary | ICD-10-CM | POA: Diagnosis not present

## 2014-08-02 DIAGNOSIS — F329 Major depressive disorder, single episode, unspecified: Secondary | ICD-10-CM | POA: Diagnosis not present

## 2014-08-02 DIAGNOSIS — I872 Venous insufficiency (chronic) (peripheral): Secondary | ICD-10-CM | POA: Diagnosis not present

## 2014-08-02 DIAGNOSIS — K219 Gastro-esophageal reflux disease without esophagitis: Secondary | ICD-10-CM | POA: Diagnosis not present

## 2014-08-02 DIAGNOSIS — Z6822 Body mass index (BMI) 22.0-22.9, adult: Secondary | ICD-10-CM | POA: Diagnosis not present

## 2014-08-02 DIAGNOSIS — C61 Malignant neoplasm of prostate: Secondary | ICD-10-CM | POA: Diagnosis not present

## 2014-08-02 DIAGNOSIS — Z008 Encounter for other general examination: Secondary | ICD-10-CM | POA: Diagnosis not present

## 2014-08-02 DIAGNOSIS — M545 Low back pain: Secondary | ICD-10-CM | POA: Diagnosis not present

## 2014-08-02 DIAGNOSIS — Z1389 Encounter for screening for other disorder: Secondary | ICD-10-CM | POA: Diagnosis not present

## 2014-08-11 DIAGNOSIS — M545 Low back pain: Secondary | ICD-10-CM | POA: Diagnosis not present

## 2014-08-11 DIAGNOSIS — M79606 Pain in leg, unspecified: Secondary | ICD-10-CM | POA: Diagnosis not present

## 2014-08-11 DIAGNOSIS — M4134 Thoracogenic scoliosis, thoracic region: Secondary | ICD-10-CM | POA: Diagnosis not present

## 2014-08-13 DIAGNOSIS — H52203 Unspecified astigmatism, bilateral: Secondary | ICD-10-CM | POA: Diagnosis not present

## 2014-08-13 DIAGNOSIS — H43813 Vitreous degeneration, bilateral: Secondary | ICD-10-CM | POA: Diagnosis not present

## 2014-08-13 DIAGNOSIS — H2513 Age-related nuclear cataract, bilateral: Secondary | ICD-10-CM | POA: Diagnosis not present

## 2014-08-13 DIAGNOSIS — D2311 Other benign neoplasm of skin of right eyelid, including canthus: Secondary | ICD-10-CM | POA: Diagnosis not present

## 2014-08-18 DIAGNOSIS — M545 Low back pain: Secondary | ICD-10-CM | POA: Diagnosis not present

## 2014-08-18 DIAGNOSIS — M79606 Pain in leg, unspecified: Secondary | ICD-10-CM | POA: Diagnosis not present

## 2014-08-18 DIAGNOSIS — M4134 Thoracogenic scoliosis, thoracic region: Secondary | ICD-10-CM | POA: Diagnosis not present

## 2014-08-20 DIAGNOSIS — M4134 Thoracogenic scoliosis, thoracic region: Secondary | ICD-10-CM | POA: Diagnosis not present

## 2014-08-20 DIAGNOSIS — M545 Low back pain: Secondary | ICD-10-CM | POA: Diagnosis not present

## 2014-08-20 DIAGNOSIS — M79606 Pain in leg, unspecified: Secondary | ICD-10-CM | POA: Diagnosis not present

## 2014-08-25 DIAGNOSIS — M79606 Pain in leg, unspecified: Secondary | ICD-10-CM | POA: Diagnosis not present

## 2014-08-25 DIAGNOSIS — M4134 Thoracogenic scoliosis, thoracic region: Secondary | ICD-10-CM | POA: Diagnosis not present

## 2014-08-25 DIAGNOSIS — M545 Low back pain: Secondary | ICD-10-CM | POA: Diagnosis not present

## 2014-09-02 DIAGNOSIS — M79606 Pain in leg, unspecified: Secondary | ICD-10-CM | POA: Diagnosis not present

## 2014-09-02 DIAGNOSIS — M4134 Thoracogenic scoliosis, thoracic region: Secondary | ICD-10-CM | POA: Diagnosis not present

## 2014-09-02 DIAGNOSIS — M545 Low back pain: Secondary | ICD-10-CM | POA: Diagnosis not present

## 2014-10-06 ENCOUNTER — Other Ambulatory Visit (INDEPENDENT_AMBULATORY_CARE_PROVIDER_SITE_OTHER): Payer: Self-pay | Admitting: Surgery

## 2014-10-06 DIAGNOSIS — K4020 Bilateral inguinal hernia, without obstruction or gangrene, not specified as recurrent: Secondary | ICD-10-CM | POA: Diagnosis not present

## 2014-10-06 NOTE — H&P (Signed)
Ronald Lowery 10/06/2014 3:25 PM Location: [facility] Patient #: [id number] DOB: [birth year] Married / Language: Cleophus Molt / Race: White Male  History of Present Illness Ronald Hector MD; 10/06/2014 5:56 PM) Patient words: RIH.  The patient is a 73 year old male who presents with an inguinal hernia. Patient sent to me by his urologist, Dr. Dutch Lowery, for concerns of symptomatic inguinal hernias. Active prostate cancer survivor. Underwent robotic prostatectomy about 5 years ago. No radiation. Rarely has urinary incontinence. No major urinary problems. Had appendectomy as a teenager. He comes today by himself. He has a legal pad with a very detailed history he relates to me. He's had some intermittent episodes of sharp right groin pain. On time while doing physical therapy. Another time after swallowing. Twice last month. Concerned him. Felt a right groin bump one time that he had a massage back in. He thinks he's been told that he has hernias. He seems to have a right groin bulge now. He's had occasional pains on the left side as well. His groins and testicles always been sensitive since the prostate surgery, but never severe. However because of more frequent symptoms this year, wish to see if surgery needed for probable hernias. He tells me Dr. Alinda Lowery was concerned for that. Patient swims 30 minutes a day. Does struggle with back problems so cannot walk as far as he used to. Usually has a few bowel movements a day. No history urinating tract infections.   Other Problems Ronald Lowery, CMA; 10/06/2014 3:25 PM) Anxiety Disorder Arthritis Back Pain Cancer Depression Gastroesophageal Reflux Disease Hemorrhoids Hypercholesterolemia Inguinal Hernia Thyroid Disease Vascular Disease  Past Surgical History Ronald Lowery, [place]; 10/06/2014 3:25 PM) Appendectomy Colon Polyp Removal - Colonoscopy Foot Surgery Bilateral. Prostate Surgery -  Removal Tonsillectomy  Diagnostic Studies History Ronald Lowery, [place]; 10/06/2014 3:25 PM) Colonoscopy 5-10 years ago  Allergies Ronald Lowery, [place]; 10/06/2014 3:26 PM) No Known Drug Allergies12/23/2015  Medication History Ronald Lowery, CMA; 10/06/2014 3:35 PM) Synthroid (137MCG Tablet, Oral) Active. Omeprazole Magnesium (20MG  Tablet DR, Oral) Active. Gabapentin (100MG  Capsule, Oral) Active. Zolpidem Tartrate (10MG  Tablet, Oral) Active. Meloxicam (15MG  Tablet, Oral) Active. Acetaminophen (650MG  Tablet, Oral) Active. Loratadine (10MG  Tablet, Oral) Active. Suphedrin 12 Hour (120MG  Tablet ER 12HR, Oral) Active. Vitamin C ER (500MG  Tablet ER, Oral) Active. Ocean Nasal Mist (0.65% Solution, Nasal) Active. [place] Nasal Mist Allergy/Sinus (2.65% Solution, Nasal) Active. Probiotic Product (Oral) Active.  Social History Ronald Lowery, [place]; 10/06/2014 3:25 PM) Caffeine use Carbonated beverages, Coffee, Tea. No alcohol use No drug use Tobacco use Never smoker.  Family History Ronald Lowery, [place]; 10/06/2014 3:25 PM) Arthritis Mother. Breast Cancer Mother, Sister. Depression Mother. Melanoma Father. Migraine Headache Mother.  Review of Systems Ronald Lowery CMA; 10/06/2014 3:25 PM) General Not Present- Appetite Loss, Chills, Fatigue, Fever, Night Sweats, Weight Gain and Weight Loss. Skin Not Present- Change in Wart/Mole, Dryness, Hives, Jaundice, New Lesions, Non-Healing Wounds, Rash and Ulcer. HEENT Present- Hearing Loss and Ringing in the Ears. Not Present- Earache, Hoarseness, Nose Bleed, Oral Ulcers, Seasonal Allergies, Sinus Pain, Sore Throat, Visual Disturbances, Wears glasses/contact lenses and Yellow Eyes. Respiratory Not Present- Bloody sputum, Chronic Cough, Difficulty Breathing, Snoring and Wheezing. Breast Not Present- Breast Mass, Breast Pain, Nipple Discharge and Skin Changes. Cardiovascular Present- Leg Cramps and Swelling of  Extremities. Not Present- Chest Pain, Difficulty Breathing Lying Down, Palpitations, Rapid Heart Rate and Shortness of Breath. Gastrointestinal Not Present- Abdominal Pain, Bloating, Bloody Stool, Change in Bowel Habits, Chronic diarrhea, Constipation, Difficulty  Swallowing, Excessive gas, Gets full quickly at meals, Hemorrhoids, Indigestion, Nausea, Rectal Pain and Vomiting. Male Genitourinary Present- Frequency, Impotence and Urgency. Not Present- Blood in Urine, Change in Urinary Stream, Nocturia, Painful Urination and Urine Leakage. Musculoskeletal Present- Back Pain, Joint Pain, Muscle Pain and Muscle Weakness. Not Present- Joint Stiffness and Swelling of Extremities. Neurological Present- Decreased Memory, Tingling, Tremor, Trouble walking and Weakness. Not Present- Fainting, Headaches, Numbness and Seizures. Psychiatric Present- Anxiety, Bipolar and Depression. Not Present- Change in Sleep Pattern, Fearful and Frequent crying. Endocrine Present- Cold Intolerance, Heat Intolerance and Hot flashes. Not Present- Excessive Hunger, Hair Changes and New Diabetes. Hematology Not Present- Easy Bruising, Excessive bleeding, Gland problems, HIV and Persistent Infections.   Vitals Ronald Lowery CMA; 10/06/2014 3:35 PM) 10/06/2014 3:35 PM Weight: 176.5 lb Height: 75in Body Surface Area: 2.06 m Body Mass Index: 22.06 kg/m Temp.: 97.20F  Pulse: 62 (Regular)  BP: 140/82 (Sitting, Left Arm, Standard)    Physical Exam Ronald Hector MD; 10/06/2014 4:08 PM) General Mental Status-Alert. General Appearance-Not in acute distress, Not Sickly. Orientation-Oriented X3. Hydration-Well hydrated. Voice-Normal.  Integumentary Global Assessment Upon inspection and palpation of skin surfaces of the - Axillae: non-tender, no inflammation or ulceration, no drainage. and Distribution of scalp and body hair is normal. General Characteristics Temperature - normal warmth is  noted. Note: Sweats rather easily especially on head and trunk   Head and Neck Head-normocephalic, atraumatic with no lesions or palpable masses. Face Global Assessment - atraumatic, no absence of expression. Neck Global Assessment - no abnormal movements, no bruit auscultated on the right, no bruit auscultated on the left, no decreased range of motion, non-tender. Trachea-midline. Thyroid Gland Characteristics - non-tender.  Eye Eyeball - Left-Extraocular movements intact, No Nystagmus. Eyeball - Right-Extraocular movements intact, No Nystagmus. Cornea - Left-No Hazy. Cornea - Right-No Hazy. Sclera/Conjunctiva - Left-No scleral icterus, No Discharge. Sclera/Conjunctiva - Right-No scleral icterus, No Discharge. Pupil - Left-Direct reaction to light normal. Pupil - Right-Direct reaction to light normal.  ENMT Ears Pinna - Left - no drainage observed, no generalized tenderness observed. Right - no drainage observed, no generalized tenderness observed. Nose and Sinuses External Inspection of the Nose - no destructive lesion observed. Inspection of the nares - Left - quiet respiration. Right - quiet respiration. Mouth and Throat Lips - Upper Lip - no fissures observed, no pallor noted. Lower Lip - no fissures observed, no pallor noted. Nasopharynx - no discharge present. Oral Cavity/Oropharynx - Tongue - no dryness observed. Oral Mucosa - no cyanosis observed. Hypopharynx - no evidence of airway distress observed.  Chest and Lung Exam Inspection Movements - Normal and Symmetrical. Accessory muscles - No use of accessory muscles in breathing. Palpation Palpation of the chest reveals - Non-tender. Auscultation Breath sounds - Normal and Clear.  Cardiovascular Auscultation Rhythm - Regular. Murmurs & Other Heart Sounds - Auscultation of the heart reveals - No Murmurs and No Systolic Clicks.  Abdomen Inspection Inspection of the abdomen reveals - No Visible  peristalsis and No Abnormal pulsations. Umbilicus - No Bleeding, No Urine drainage. Palpation/Percussion Palpation and Percussion of the abdomen reveal - Soft, Non Tender, No Rebound tenderness, No Rigidity (guarding) and No Cutaneous hyperesthesia.  Male Genitourinary Sexual Maturity Tanner 5 - Adult hair pattern and Adult penile size and shape. Note: Normal external male genitalia. Circumcised. Very sensitive external rings with mild right groin bulging suspicious for right inguinal hernia. Sensitive on left side suspicious for small left internal hernias well. Femoral canal sensitive but no definite  hernias there.   Peripheral Vascular Upper Extremity Inspection - Left - No Cyanotic nailbeds, Not Ischemic. Right - No Cyanotic nailbeds, Not Ischemic.  Neurologic Neurologic evaluation reveals -normal attention span and ability to concentrate, able to name objects and repeat phrases. Appropriate fund of knowledge , normal sensation and normal coordination. Mental Status Affect - not angry, not paranoid. Cranial Nerves-Normal Bilaterally. Gait-Normal.  Neuropsychiatric Mental status exam performed with findings of-able to articulate well with normal speech/language, rate, volume and coherence, thought content normal with ability to perform basic computations and apply abstract reasoning and no evidence of hallucinations, delusions, obsessions or homicidal/suicidal ideation.  Musculoskeletal Global Assessment Spine, Ribs and Pelvis - no instability, subluxation or laxity. Right Upper Extremity - no instability, subluxation or laxity. Note: Some kyphosis and scoliosis of thoracic spine.   Lymphatic Head & Neck  General Head & Neck Lymphatics: Bilateral - Description - No Localized lymphadenopathy. Axillary  General Axillary Region: Bilateral - Description - No Localized lymphadenopathy. Femoral & Inguinal  Generalized Femoral & Inguinal Lymphatics: Left - Description -  No Localized lymphadenopathy. Right - Description - No Localized lymphadenopathy.    Assessment & Plan Ronald Hector MD; 10/06/2014 5:57 PM) BILATERAL INGUINAL HERNIA WITHOUT OBSTRUCTION OR GANGRENE, RECURRENCE NOT SPECIFIED (550.92  K40.20) Impression: I suspect he has right greater than left inguinal hernias. Very sensitive. 1 obvious episode of near incarceration that he was able to reduce. He is interested in proceeding with surgery. He wishes to avoid an emergency situation. Good exercise tolerance.  Reasonable to try laparoscopically but given history of prior robotic prostatectomy higher conversion rate to open possible. He did not have radiation he has had no recurrence in the past 5 years since his robotic resection. I gave him information on surgery. He wishes to hopefully do it in February after his wife recovers from her shoulder surgery in January. Current Plans  Schedule for Surgery  The anatomy & physiology of the abdominal wall and pelvic floor was discussed. The pathophysiology of hernias in the inguinal and pelvic region was discussed. Natural history risks such as progressive enlargement, pain, incarceration & strangulation was discussed. Contributors to complications such as smoking, obesity, diabetes, prior surgery, etc were discussed.  I feel the risks of no intervention will lead to serious problems that outweigh the operative risks; therefore, I recommended surgery to reduce and repair the hernia. I explained laparoscopic techniques with possible need for an open approach. I noted usual use of mesh to patch and/or buttress hernia repair  Risks such as bleeding, infection, abscess, need for further treatment, heart attack, death, and other risks were discussed. I noted a good likelihood this will help address the problem. Goals of post-operative recovery were discussed as well. Possibility that this will not correct all symptoms was explained. I stressed the importance of  low-impact activity, aggressive pain control, avoiding constipation, & not pushing through pain to minimize risk of post-operative chronic pain or injury. Possibility of reherniation was discussed. We will work to minimize complications.  An educational handout further explaining the pathology & treatment options was given as well. Questions were answered. The patient expresses understanding & wishes to proceed with surgery. Pt Education - [facility] Pain Control (Tamyah Cutbirth) Pt Education - [facility] Good Bowel Health (Amanada Philbrick) Pt Education - [facility] Laparosopic Post Op HCI (Mccall Will) Pt Education - [facility] Pelvic Floor Exercises (Kegels) and Dysfunction HCI (Xiana Carns)   Signed by Ronald Hector, MD (10/06/2014 5:58 PM)

## 2014-12-06 ENCOUNTER — Inpatient Hospital Stay (HOSPITAL_COMMUNITY): Admission: RE | Admit: 2014-12-06 | Payer: Medicare Other | Source: Ambulatory Visit

## 2014-12-14 ENCOUNTER — Ambulatory Visit (HOSPITAL_COMMUNITY): Admission: RE | Admit: 2014-12-14 | Payer: Medicare Other | Source: Ambulatory Visit | Admitting: Surgery

## 2014-12-14 ENCOUNTER — Encounter (HOSPITAL_COMMUNITY): Admission: RE | Payer: Self-pay | Source: Ambulatory Visit

## 2014-12-14 SURGERY — REPAIR, HERNIA, INGUINAL, BILATERAL, LAPAROSCOPIC
Anesthesia: General | Laterality: Bilateral

## 2015-01-19 DIAGNOSIS — I872 Venous insufficiency (chronic) (peripheral): Secondary | ICD-10-CM | POA: Diagnosis not present

## 2015-01-19 DIAGNOSIS — Z1389 Encounter for screening for other disorder: Secondary | ICD-10-CM | POA: Diagnosis not present

## 2015-01-19 DIAGNOSIS — K219 Gastro-esophageal reflux disease without esophagitis: Secondary | ICD-10-CM | POA: Diagnosis not present

## 2015-01-19 DIAGNOSIS — Z6822 Body mass index (BMI) 22.0-22.9, adult: Secondary | ICD-10-CM | POA: Diagnosis not present

## 2015-01-19 DIAGNOSIS — M545 Low back pain: Secondary | ICD-10-CM | POA: Diagnosis not present

## 2015-01-19 DIAGNOSIS — F329 Major depressive disorder, single episode, unspecified: Secondary | ICD-10-CM | POA: Diagnosis not present

## 2015-01-20 ENCOUNTER — Other Ambulatory Visit: Payer: Self-pay | Admitting: Internal Medicine

## 2015-01-20 DIAGNOSIS — M545 Low back pain: Secondary | ICD-10-CM

## 2015-01-25 DIAGNOSIS — H93292 Other abnormal auditory perceptions, left ear: Secondary | ICD-10-CM | POA: Diagnosis not present

## 2015-01-25 DIAGNOSIS — H6122 Impacted cerumen, left ear: Secondary | ICD-10-CM | POA: Diagnosis not present

## 2015-01-26 DIAGNOSIS — C61 Malignant neoplasm of prostate: Secondary | ICD-10-CM | POA: Diagnosis not present

## 2015-01-31 ENCOUNTER — Ambulatory Visit
Admission: RE | Admit: 2015-01-31 | Discharge: 2015-01-31 | Disposition: A | Discharge status: Home / Self Care | Payer: Medicare Other | Source: Ambulatory Visit | Attending: Internal Medicine | Admitting: Internal Medicine

## 2015-01-31 DIAGNOSIS — Z87828 Personal history of other (healed) physical injury and trauma: Secondary | ICD-10-CM | POA: Diagnosis not present

## 2015-01-31 DIAGNOSIS — M4806 Spinal stenosis, lumbar region: Secondary | ICD-10-CM | POA: Diagnosis not present

## 2015-01-31 DIAGNOSIS — M545 Low back pain: Secondary | ICD-10-CM

## 2015-01-31 DIAGNOSIS — M47817 Spondylosis without myelopathy or radiculopathy, lumbosacral region: Secondary | ICD-10-CM | POA: Diagnosis not present

## 2015-01-31 DIAGNOSIS — M4186 Other forms of scoliosis, lumbar region: Secondary | ICD-10-CM | POA: Diagnosis not present

## 2015-02-02 DIAGNOSIS — C61 Malignant neoplasm of prostate: Secondary | ICD-10-CM | POA: Diagnosis not present

## 2015-02-02 DIAGNOSIS — N529 Male erectile dysfunction, unspecified: Secondary | ICD-10-CM | POA: Diagnosis not present

## 2015-02-04 ENCOUNTER — Other Ambulatory Visit: Payer: Self-pay | Admitting: Internal Medicine

## 2015-02-04 DIAGNOSIS — M545 Low back pain, unspecified: Secondary | ICD-10-CM

## 2015-02-10 ENCOUNTER — Ambulatory Visit
Admission: RE | Admit: 2015-02-10 | Discharge: 2015-02-10 | Disposition: A | Discharge status: Home / Self Care | Payer: Medicare Other | Source: Ambulatory Visit | Attending: Internal Medicine | Admitting: Internal Medicine

## 2015-02-10 DIAGNOSIS — M545 Low back pain, unspecified: Secondary | ICD-10-CM

## 2015-02-10 DIAGNOSIS — M5126 Other intervertebral disc displacement, lumbar region: Secondary | ICD-10-CM | POA: Diagnosis not present

## 2015-02-10 MED ORDER — IOHEXOL 180 MG/ML  SOLN
1.0000 mL | Freq: Once | INTRAMUSCULAR | Status: AC | PRN
  Administered 2015-02-10: 1 mL via EPIDURAL

## 2015-02-10 MED ORDER — METHYLPREDNISOLONE ACETATE 40 MG/ML INJ SUSP (RADIOLOG
120.0000 mg | Freq: Once | INTRAMUSCULAR | Status: AC
  Administered 2015-02-10: 120 mg via EPIDURAL

## 2015-02-10 NOTE — Discharge Instructions (Signed)

## 2015-02-15 DIAGNOSIS — K4120 Bilateral femoral hernia, without obstruction or gangrene, not specified as recurrent: Secondary | ICD-10-CM | POA: Diagnosis not present

## 2015-02-15 DIAGNOSIS — K4090 Unilateral inguinal hernia, without obstruction or gangrene, not specified as recurrent: Secondary | ICD-10-CM | POA: Diagnosis not present

## 2015-02-15 DIAGNOSIS — K4020 Bilateral inguinal hernia, without obstruction or gangrene, not specified as recurrent: Secondary | ICD-10-CM | POA: Diagnosis not present

## 2015-02-15 DIAGNOSIS — K4190 Unilateral femoral hernia, without obstruction or gangrene, not specified as recurrent: Secondary | ICD-10-CM | POA: Diagnosis not present

## 2015-03-01 DIAGNOSIS — H43813 Vitreous degeneration, bilateral: Secondary | ICD-10-CM | POA: Diagnosis not present

## 2015-03-01 DIAGNOSIS — H31002 Unspecified chorioretinal scars, left eye: Secondary | ICD-10-CM | POA: Diagnosis not present

## 2015-03-01 DIAGNOSIS — D2311 Other benign neoplasm of skin of right eyelid, including canthus: Secondary | ICD-10-CM | POA: Diagnosis not present

## 2015-03-01 DIAGNOSIS — H2513 Age-related nuclear cataract, bilateral: Secondary | ICD-10-CM | POA: Diagnosis not present

## 2015-04-01 DIAGNOSIS — R319 Hematuria, unspecified: Secondary | ICD-10-CM | POA: Diagnosis not present

## 2015-04-04 DIAGNOSIS — R310 Gross hematuria: Secondary | ICD-10-CM | POA: Diagnosis not present

## 2015-04-04 DIAGNOSIS — R312 Other microscopic hematuria: Secondary | ICD-10-CM | POA: Diagnosis not present

## 2015-04-04 DIAGNOSIS — Z8546 Personal history of malignant neoplasm of prostate: Secondary | ICD-10-CM | POA: Diagnosis not present

## 2015-04-08 ENCOUNTER — Other Ambulatory Visit: Payer: Self-pay | Admitting: Urology

## 2015-04-08 DIAGNOSIS — C678 Malignant neoplasm of overlapping sites of bladder: Secondary | ICD-10-CM | POA: Diagnosis not present

## 2015-04-08 DIAGNOSIS — R310 Gross hematuria: Secondary | ICD-10-CM | POA: Diagnosis not present

## 2015-04-08 DIAGNOSIS — C675 Malignant neoplasm of bladder neck: Secondary | ICD-10-CM | POA: Diagnosis not present

## 2015-04-29 DIAGNOSIS — Z6823 Body mass index (BMI) 23.0-23.9, adult: Secondary | ICD-10-CM | POA: Diagnosis not present

## 2015-04-29 DIAGNOSIS — I10 Essential (primary) hypertension: Secondary | ICD-10-CM | POA: Diagnosis not present

## 2015-04-29 DIAGNOSIS — I872 Venous insufficiency (chronic) (peripheral): Secondary | ICD-10-CM | POA: Diagnosis not present

## 2015-04-29 DIAGNOSIS — M7989 Other specified soft tissue disorders: Secondary | ICD-10-CM | POA: Diagnosis not present

## 2015-04-29 DIAGNOSIS — D494 Neoplasm of unspecified behavior of bladder: Secondary | ICD-10-CM | POA: Diagnosis not present

## 2015-05-02 DIAGNOSIS — Z85828 Personal history of other malignant neoplasm of skin: Secondary | ICD-10-CM | POA: Diagnosis not present

## 2015-05-02 DIAGNOSIS — D1801 Hemangioma of skin and subcutaneous tissue: Secondary | ICD-10-CM | POA: Diagnosis not present

## 2015-05-02 DIAGNOSIS — D225 Melanocytic nevi of trunk: Secondary | ICD-10-CM | POA: Diagnosis not present

## 2015-05-02 DIAGNOSIS — D220 Melanocytic nevi of lip: Secondary | ICD-10-CM | POA: Diagnosis not present

## 2015-05-02 DIAGNOSIS — L821 Other seborrheic keratosis: Secondary | ICD-10-CM | POA: Diagnosis not present

## 2015-05-02 NOTE — Patient Instructions (Addendum)
BREWER HITCHMAN  05/02/2015   Your procedure is scheduled on: Monday 05/09/15  Report to [facility]  Entrance take [facility]  elevators to 3rd floor to  [place] at 2:15 PM.  Call this number if you have problems the morning of surgery [phone]   Remember: ONLY 1 PERSON MAY GO WITH YOU TO SHORT STAY TO GET  READY MORNING OF [place].  Do not eat food :After Midnight. Clear liquids from midnight until 10:15am on 05/09/15 then nothing.     Take these medicines the morning of surgery with A SIP OF WATER: levothyroxine, claritin, prilosec, nasal spray if needed, eye drops if needed.                                You may not have any metal on your body including hair pins and              piercings  Do not wear jewelry, make-up, lotions, powders or perfumes, deodorant             Do not wear nail polish.  Do not shave  48 hours prior to surgery.              Men may shave face and neck.  Do not bring valuables to the hospital. [place].  Contacts, dentures or bridgework may not be worn into surgery.   Patients discharged the day of surgery will not be allowed to drive home.  Name and phone number of your driver: wife Steward Drone [phone]   _____________________________________________________________________           [facility] - Preparing for Surgery Before surgery, you can play an important role.  Because skin is not sterile, your skin needs to be as free of germs as possible.  You can reduce the number of germs on your skin by washing with CHG (chlorahexidine gluconate) soap before surgery.  CHG is an antiseptic cleaner which kills germs and bonds with the skin to continue killing germs even after washing. Please DO NOT use if you have an allergy to CHG or antibacterial soaps.  If your skin becomes reddened/irritated stop using the CHG and inform your nurse when you arrive at Short Stay. Do not shave  (including legs and underarms) for at least 48 hours prior to the first CHG shower.  You may shave your face/neck. Please follow these instructions carefully:  1.  Shower with CHG Soap the night before surgery and the  morning of Surgery.  2.  If you choose to wash your hair, wash your hair first as usual with your  normal  shampoo.  3.  After you shampoo, rinse your hair and body thoroughly to remove the  shampoo.                            4.  Use CHG as you would any other liquid soap.  You can apply chg directly  to the skin and wash                       Gently with a scrungie or clean washcloth.  5.  Apply the CHG Soap  to your body ONLY FROM THE NECK DOWN.   Do not use on face/ open                           Wound or open sores. Avoid contact with eyes, ears mouth and genitals (private parts).                       Wash face,  Genitals (private parts) with your normal soap.             6.  Wash thoroughly, paying special attention to the area where your surgery  will be performed.  7.  Thoroughly rinse your body with warm water from the neck down.  8.  DO NOT shower/wash with your normal soap after using and rinsing off  the CHG Soap.                9.  Pat yourself dry with a clean towel.            10.  Wear clean pajamas.            11.  Place clean sheets on your bed the night of your first shower and do not  sleep with pets. Day of Surgery : Do not apply any lotions/deodorants the morning of surgery.  Please wear clean clothes to the hospital/surgery center.  FAILURE TO FOLLOW THESE INSTRUCTIONS MAY RESULT IN THE CANCELLATION OF YOUR SURGERY PATIENT SIGNATURE_________________________________  NURSE SIGNATURE__________________________________  ________________________________________________________________________   CLEAR LIQUID DIET   Foods Allowed                                                                     Foods Excluded  Coffee and tea, regular and decaf                              liquids that you cannot  Plain Jell-O in any flavor                                             see through such as: Fruit ices (not with fruit pulp)                                     milk, soups, orange juice  Iced Popsicles                                    All solid food Carbonated beverages, regular and diet                                    Cranberry, grape and apple juices Sports drinks like Gatorade Lightly seasoned clear broth or consume(fat free) Sugar, honey syrup  Sample Menu Breakfast  Lunch                                     Supper Cranberry juice                    Beef broth                            Chicken broth Jell-O                                     Grape juice                           Apple juice Coffee or tea                        Jell-O                                      Popsicle                                                Coffee or tea                        Coffee or tea  _____________________________________________________________________

## 2015-05-02 NOTE — Progress Notes (Signed)
EKG 02/15/15 on chart, BUN and creatinine results 04/04/15 on chart

## 2015-05-03 ENCOUNTER — Ambulatory Visit (HOSPITAL_COMMUNITY)
Admission: RE | Admit: 2015-05-03 | Discharge: 2015-05-03 | Disposition: A | Discharge status: Home / Self Care | Payer: Medicare Other | Source: Ambulatory Visit | Attending: Anesthesiology | Admitting: Anesthesiology

## 2015-05-03 ENCOUNTER — Encounter (HOSPITAL_COMMUNITY)
Admission: RE | Admit: 2015-05-03 | Discharge: 2015-05-03 | Disposition: A | Discharge status: Home / Self Care | Payer: Medicare Other | Source: Ambulatory Visit | Attending: Urology | Admitting: Urology

## 2015-05-03 ENCOUNTER — Encounter (HOSPITAL_COMMUNITY): Payer: Self-pay

## 2015-05-03 DIAGNOSIS — D494 Neoplasm of unspecified behavior of bladder: Secondary | ICD-10-CM | POA: Diagnosis not present

## 2015-05-03 DIAGNOSIS — Z01818 Encounter for other preprocedural examination: Secondary | ICD-10-CM

## 2015-05-03 DIAGNOSIS — Z01812 Encounter for preprocedural laboratory examination: Secondary | ICD-10-CM | POA: Insufficient documentation

## 2015-05-03 DIAGNOSIS — J984 Other disorders of lung: Secondary | ICD-10-CM | POA: Diagnosis not present

## 2015-05-03 HISTORY — DX: Scoliosis, unspecified: M41.9

## 2015-05-03 HISTORY — DX: Basal cell carcinoma of skin, unspecified: C44.91

## 2015-05-03 HISTORY — DX: Urgency of urination: R39.15

## 2015-05-03 HISTORY — DX: Adverse effect of unspecified anesthetic, initial encounter: T41.45XA

## 2015-05-03 HISTORY — DX: Unspecified cataract: H26.9

## 2015-05-03 HISTORY — DX: Other intervertebral disc degeneration, lumbar region: M51.36

## 2015-05-03 HISTORY — DX: Other intervertebral disc degeneration, lumbar region without mention of lumbar back pain or lower extremity pain: M51.369

## 2015-05-03 HISTORY — DX: Unspecified osteoarthritis, unspecified site: M19.90

## 2015-05-03 HISTORY — DX: Gastro-esophageal reflux disease without esophagitis: K21.9

## 2015-05-03 HISTORY — DX: Essential (primary) hypertension: I10

## 2015-05-03 HISTORY — DX: Reserved for concepts with insufficient information to code with codable children: IMO0002

## 2015-05-03 HISTORY — DX: Other complications of anesthesia, initial encounter: T88.59XA

## 2015-05-03 LAB — BASIC METABOLIC PANEL
Anion gap: 8 (ref 5–15)
BUN: 11 mg/dL (ref 6–20)
CALCIUM: 9.2 mg/dL (ref 8.9–10.3)
CO2: 29 mmol/L (ref 22–32)
Chloride: 97 mmol/L — ABNORMAL LOW (ref 101–111)
Creatinine, Ser: 0.87 mg/dL (ref 0.61–1.24)
GFR calc non Af Amer: >60 mL/min (ref >60)
GLUCOSE: 92 mg/dL (ref 65–99)
Potassium: 4.6 mmol/L (ref 3.5–5.1)
Sodium: 134 mmol/L — ABNORMAL LOW (ref 135–145)

## 2015-05-03 LAB — CBC
HCT: 38.6 % — ABNORMAL LOW (ref 39.0–52.0)
HEMOGLOBIN: 12.8 g/dL — AB (ref 13.0–17.0)
MCH: 30.9 pg (ref 26.0–34.0)
MCHC: 33.2 g/dL (ref 30.0–36.0)
MCV: 93.2 fL (ref 78.0–100.0)
PLATELETS: 298 10*3/uL (ref 150–400)
RBC: 4.14 MIL/uL — ABNORMAL LOW (ref 4.22–5.81)
RDW: 12.1 % (ref 11.5–15.5)
WBC: 6.5 10*3/uL (ref 4.0–10.5)

## 2015-05-06 NOTE — H&P (Signed)
  History of Present Illness [name]' Ronald Lowery is a 74 year old with prostate cancer s/p a BNS RAL radical prostatectomy on October 25, 2008. His PSA has been undetectable since treatment.    TNM stage: pT2c N0 Mx   Gleason score: 3+4=7   Surgical margins: Negative  Pretreatment PSA: 8.3  Pretreatment SHIM: 22 (He did use Levitra 20 mg preoperatively)  He recently presented with hematuria and was found to have a bladder tumor.   Past Medical History Problems  1. History of Anxiety (F41.9) 2. History of Arthritis 3. History of Bipolar Disorder 4. History of depression (Z86.59) 5. History of heartburn (Z87.898) 6. History of hypercholesterolemia (Z86.39) 7. History of hypothyroidism (Z86.39) 8. History of Intermetatarsal Neuroma 9. History of Left inguinal hernia (K40.90) 10. Prostate cancer (C61)  Surgical History Problems  1. History of Appendectomy 2. History of Foot Surgery 3. History of Inguinal Hernia Repair 4. History of Prostatect Retropubic Radical W/ Nerve Sparing Laparoscopic 5. History of Tonsillectomy  Current Meds 1. Acetaminophen 500 MG Oral Capsule;  Therapy: (Recorded:20Jun2016) to Recorded 2. Cephalexin 500 MG Oral Capsule;  Therapy: (Recorded:20Jun2016) to Recorded 3. Gabapentin 100 MG Oral Capsule;  Therapy: [id number] to Recorded 4. Gabapentin 300 MG TABS;  Therapy: (Recorded:20Jun2016) to Recorded 5. Jublia 10 % External Solution;  Therapy: (Recorded:17Apr2015) to Recorded 6. Loratadine 10 MG Oral Tablet;  Therapy: (Recorded:10Sep2009) to Recorded 7. Meloxicam 15 MG Oral Tablet;  Therapy: [id number] to Recorded 8. Natural Fiber Laxative POWD;  Therapy: (Recorded:10Sep2009) to Recorded 9. Omeprazole 20 MG Oral Capsule Delayed Release;  Therapy: [id number] to Recorded 10. Saline Nasal Spray SOLN;   Therapy: (Recorded:10Sep2009) to Recorded 11. Synthroid 137 MCG Oral Tablet;   Therapy: 27Feb2013 to Recorded 12. TheraSol LIQD;   Therapy:  (Recorded:10Sep2009) to Recorded 13. Zolpidem Tartrate 10 MG Oral Tablet;   Therapy: (Recorded:10Sep2009) to Recorded  Allergies Medication  1. Cefdinir CAPS Non-Medication  2. Dust 3. Mold  Family History Problems  1. Family history of Malignant Melanoma Of The Skin : Father 2. Denied: Family history of Prostate Cancer  Social History Problems  1. Denied: History of Alcohol Use 2. Never A Smoker 3. Occupation: Retired 71. Denied: History of Tobacco Use  Review of Systems  Genitourinary: hematuria.  Constitutional: no fever.  Cardiovascular: no leg swelling.  Respiratory: no shortness of breath.   Physical Exam Constitutional: Well nourished and well developed . No acute distress.  ENT:. The ears and nose are normal in appearance.  Neck: The appearance of the neck is normal and no neck mass is present.  Pulmonary: No respiratory distress, normal respiratory rhythm and effort and clear bilateral breath sounds.  Cardiovascular: Heart rate and rhythm are normal . No peripheral edema.  Lymphatics: The femoral and inguinal nodes are not enlarged or tender.  Skin: Normal skin turgor, no visible rash and no visible skin lesions.  Neuro/Psych:. Mood and affect are appropriate.     Assessment Assessed  1. Gross hematuria (R31.0)    Discussion/Summary 1. Bladder tumor likely indicative of urothelial malignancy: He will undergo cystoscopy, transurethral resection of his bladder tumor, possible retrograde pyelography, possible need for ureteral stent on the left, and possible postoperative mitomycin C instillation. We reviewed the potential risks and complications associated with this procedure and he gives his informed consent to proceed.

## 2015-05-09 ENCOUNTER — Encounter (HOSPITAL_COMMUNITY)
Admission: RE | Disposition: A | Discharge status: Home / Self Care | Payer: Self-pay | Source: Ambulatory Visit | Attending: Urology

## 2015-05-09 ENCOUNTER — Ambulatory Visit (HOSPITAL_COMMUNITY)
Admission: RE | Admit: 2015-05-09 | Discharge: 2015-05-09 | Disposition: A | Discharge status: Home / Self Care | Payer: Medicare Other | Source: Ambulatory Visit | Attending: Urology | Admitting: Urology

## 2015-05-09 ENCOUNTER — Ambulatory Visit (HOSPITAL_COMMUNITY): Payer: Medicare Other | Admitting: Anesthesiology

## 2015-05-09 ENCOUNTER — Encounter (HOSPITAL_COMMUNITY): Payer: Self-pay | Admitting: Certified Registered"

## 2015-05-09 DIAGNOSIS — N3289 Other specified disorders of bladder: Secondary | ICD-10-CM | POA: Diagnosis not present

## 2015-05-09 DIAGNOSIS — Z791 Long term (current) use of non-steroidal anti-inflammatories (NSAID): Secondary | ICD-10-CM | POA: Diagnosis not present

## 2015-05-09 DIAGNOSIS — M1990 Unspecified osteoarthritis, unspecified site: Secondary | ICD-10-CM | POA: Insufficient documentation

## 2015-05-09 DIAGNOSIS — E039 Hypothyroidism, unspecified: Secondary | ICD-10-CM | POA: Insufficient documentation

## 2015-05-09 DIAGNOSIS — K219 Gastro-esophageal reflux disease without esophagitis: Secondary | ICD-10-CM | POA: Insufficient documentation

## 2015-05-09 DIAGNOSIS — I10 Essential (primary) hypertension: Secondary | ICD-10-CM | POA: Insufficient documentation

## 2015-05-09 DIAGNOSIS — R310 Gross hematuria: Secondary | ICD-10-CM | POA: Diagnosis present

## 2015-05-09 DIAGNOSIS — N3081 Other cystitis with hematuria: Secondary | ICD-10-CM | POA: Diagnosis not present

## 2015-05-09 DIAGNOSIS — D090 Carcinoma in situ of bladder: Secondary | ICD-10-CM | POA: Diagnosis not present

## 2015-05-09 DIAGNOSIS — N3080 Other cystitis without hematuria: Secondary | ICD-10-CM | POA: Diagnosis not present

## 2015-05-09 DIAGNOSIS — C678 Malignant neoplasm of overlapping sites of bladder: Secondary | ICD-10-CM | POA: Diagnosis not present

## 2015-05-09 DIAGNOSIS — E780 Pure hypercholesterolemia: Secondary | ICD-10-CM | POA: Insufficient documentation

## 2015-05-09 DIAGNOSIS — Z8546 Personal history of malignant neoplasm of prostate: Secondary | ICD-10-CM | POA: Insufficient documentation

## 2015-05-09 DIAGNOSIS — Z79899 Other long term (current) drug therapy: Secondary | ICD-10-CM | POA: Diagnosis not present

## 2015-05-09 HISTORY — PX: CYSTOSCOPY W/ RETROGRADES: SHX1426

## 2015-05-09 HISTORY — PX: TRANSURETHRAL RESECTION OF BLADDER TUMOR: SHX2575

## 2015-05-09 SURGERY — CYSTOSCOPY, WITH RETROGRADE PYELOGRAM
Anesthesia: General

## 2015-05-09 MED ORDER — FENTANYL CITRATE (PF) 100 MCG/2ML IJ SOLN
INTRAMUSCULAR | Status: AC
  Filled 2015-05-09: qty 2

## 2015-05-09 MED ORDER — NEOSTIGMINE METHYLSULFATE 10 MG/10ML IV SOLN
INTRAVENOUS | Status: AC
  Filled 2015-05-09: qty 1

## 2015-05-09 MED ORDER — LACTATED RINGERS IV SOLN: INTRAVENOUS | Status: DC

## 2015-05-09 MED ORDER — ONDANSETRON HCL 4 MG/2ML IJ SOLN
INTRAMUSCULAR | Status: DC | PRN
  Administered 2015-05-09: 4 mg via INTRAVENOUS

## 2015-05-09 MED ORDER — PROMETHAZINE HCL 25 MG/ML IJ SOLN: 6.2500 mg | INTRAMUSCULAR | Status: DC | PRN

## 2015-05-09 MED ORDER — CIPROFLOXACIN IN D5W 400 MG/200ML IV SOLN
INTRAVENOUS | Status: AC
  Filled 2015-05-09: qty 200

## 2015-05-09 MED ORDER — EPHEDRINE SULFATE 50 MG/ML IJ SOLN
INTRAMUSCULAR | Status: DC | PRN
  Administered 2015-05-09 (×2): 10 mg via INTRAVENOUS

## 2015-05-09 MED ORDER — OXYCODONE-ACETAMINOPHEN 5-325 MG PO TABS: 1.0000 | ORAL_TABLET | ORAL | Status: DC | PRN

## 2015-05-09 MED ORDER — ONDANSETRON HCL 4 MG/2ML IJ SOLN
INTRAMUSCULAR | Status: AC
  Filled 2015-05-09: qty 2

## 2015-05-09 MED ORDER — LACTATED RINGERS IV SOLN
INTRAVENOUS | Status: DC | PRN
  Administered 2015-05-09: 15:00:00 via INTRAVENOUS

## 2015-05-09 MED ORDER — CIPROFLOXACIN IN D5W 400 MG/200ML IV SOLN
400.0000 mg | INTRAVENOUS | Status: AC
  Administered 2015-05-09: 400 mg via INTRAVENOUS

## 2015-05-09 MED ORDER — GLYCOPYRROLATE 0.2 MG/ML IJ SOLN
INTRAMUSCULAR | Status: AC
  Filled 2015-05-09: qty 3

## 2015-05-09 MED ORDER — LIDOCAINE HCL (PF) 2 % IJ SOLN
INTRAMUSCULAR | Status: DC | PRN
  Administered 2015-05-09: 20 mg via INTRADERMAL

## 2015-05-09 MED ORDER — ROCURONIUM BROMIDE 100 MG/10ML IV SOLN
INTRAVENOUS | Status: DC | PRN
  Administered 2015-05-09: 10 mg via INTRAVENOUS
  Administered 2015-05-09: 40 mg via INTRAVENOUS

## 2015-05-09 MED ORDER — FENTANYL CITRATE (PF) 100 MCG/2ML IJ SOLN
INTRAMUSCULAR | Status: DC | PRN
  Administered 2015-05-09 (×2): 50 ug via INTRAVENOUS

## 2015-05-09 MED ORDER — PHENAZOPYRIDINE HCL 100 MG PO TABS: 100.0000 mg | ORAL_TABLET | Freq: Three times a day (TID) | ORAL | Status: DC | PRN

## 2015-05-09 MED ORDER — FENTANYL CITRATE (PF) 100 MCG/2ML IJ SOLN
25.0000 ug | INTRAMUSCULAR | Status: DC | PRN
  Administered 2015-05-09: 50 ug via INTRAVENOUS

## 2015-05-09 MED ORDER — NEOSTIGMINE METHYLSULFATE 10 MG/10ML IV SOLN
INTRAVENOUS | Status: DC | PRN
  Administered 2015-05-09: 4 mg via INTRAVENOUS

## 2015-05-09 MED ORDER — GLYCOPYRROLATE 0.2 MG/ML IJ SOLN
INTRAMUSCULAR | Status: DC | PRN
  Administered 2015-05-09: 0.6 mg via INTRAVENOUS

## 2015-05-09 MED ORDER — PROPOFOL 10 MG/ML IV BOLUS
INTRAVENOUS | Status: AC
  Filled 2015-05-09: qty 20

## 2015-05-09 MED ORDER — LIDOCAINE HCL 2 % EX GEL
CUTANEOUS | Status: DC | PRN
  Administered 2015-05-09: 1

## 2015-05-09 MED ORDER — LIDOCAINE HCL 2 % EX GEL
CUTANEOUS | Status: AC
  Filled 2015-05-09: qty 10

## 2015-05-09 MED ORDER — OXYBUTYNIN CHLORIDE 5 MG PO TABS: 5.0000 mg | ORAL_TABLET | Freq: Three times a day (TID) | ORAL | Status: DC | PRN

## 2015-05-09 MED ORDER — SODIUM CHLORIDE 0.9 % IR SOLN
Status: DC | PRN
  Administered 2015-05-09 (×3): 3000 mL
  Administered 2015-05-09: 6000 mL

## 2015-05-09 MED ORDER — DEXAMETHASONE SODIUM PHOSPHATE 10 MG/ML IJ SOLN
INTRAMUSCULAR | Status: DC | PRN
  Administered 2015-05-09: 10 mg via INTRAVENOUS

## 2015-05-09 MED ORDER — DEXAMETHASONE SODIUM PHOSPHATE 10 MG/ML IJ SOLN
INTRAMUSCULAR | Status: AC
  Filled 2015-05-09: qty 1

## 2015-05-09 MED ORDER — PROPOFOL 10 MG/ML IV BOLUS
INTRAVENOUS | Status: DC | PRN
  Administered 2015-05-09: 150 mg via INTRAVENOUS

## 2015-05-09 SURGICAL SUPPLY — 17 items
BAG URINE DRAINAGE (UROLOGICAL SUPPLIES) IMPLANT
BAG URO CATCHER STRL LF (DRAPE) ×4 IMPLANT
CATH INTERMIT  6FR 70CM (CATHETERS) ×4 IMPLANT
CLOTH BEACON ORANGE TIMEOUT ST (SAFETY) ×4 IMPLANT
ELECT REM PT RETURN 9FT ADLT (ELECTROSURGICAL)
ELECTRODE REM PT RTRN 9FT ADLT (ELECTROSURGICAL) IMPLANT
EVACUATOR MICROVAS BLADDER (UROLOGICAL SUPPLIES) IMPLANT
GLOVE BIOGEL M STRL SZ7.5 (GLOVE) ×4 IMPLANT
GOWN STRL REUS W/TWL LRG LVL3 (GOWN DISPOSABLE) ×8 IMPLANT
GUIDEWIRE STR DUAL SENSOR (WIRE) ×4 IMPLANT
KIT ASPIRATION TUBING (SET/KITS/TRAYS/PACK) IMPLANT
LOOP CUT BIPOLAR 24F LRG (ELECTROSURGICAL) ×4 IMPLANT
MANIFOLD NEPTUNE II (INSTRUMENTS) ×4 IMPLANT
PACK CYSTO (CUSTOM PROCEDURE TRAY) ×4 IMPLANT
SYRINGE IRR TOOMEY STRL 70CC (SYRINGE) ×4 IMPLANT
TUBING CONNECTING 10 (TUBING) ×3 IMPLANT
TUBING CONNECTING 10' (TUBING) ×1

## 2015-05-09 NOTE — Op Note (Signed)
Post-operative Diagnosis:  1. Gross hematuria 2. Bladder Tumor 3. Prostate Cancer  Procedure and Anesthesia:  Procedure(s) and Anesthesia Type:    * CYSTOSCOPY WITH POSSIBLE BILATERAL RETROGRADE PYELOGRAM - General    * TRANSURETHRAL RESECTION OF BLADDER TUMOR (TURBT 5.5 cm tumor) - General  1. Cystourethroscopy with Transurethral Resection of Bladder Tumor-- Large 2. Ureteral catheterization without instillation of contrast  3. Pelvic exam under anesthesia  Surgeon: Surgeon(s) and Role:    * Raynelle Bring, MD - Primary   Resident:  Star Age, MD  EBL: 10 mL  IVF: See anesthesia record  UOP: See anesthesia record  Drains: None  Specimens: 1. Bladder tumor biopsy 2. Deep biopsies from the blader trigone  Complications: None  Indications for Surgery: 74 y.o. male with history of prostate cancer status post radical prostatectomy who presented with gross hematuria and was noted to have a bladder tumor on local cystoscopy. He is being taken to the operating room for cystourethroscopy and transurethral resection of bladder tumor. Please see prior H&P for full details. Risks, benefits, and alternatives of the above procedure were discussed previously in detail and informed consents was signed and verified.  Findings:  1. Diffuse erythematous velvety raised urothelium over the trigone, circumferentially around the bladder neck and overlying the LEFT intramural ureter at the LEFT posterolateral wall. High Grade appearing. 2. Successful cannulation of the LEFT ureteral orifice without evidence of perforation or obstruction 3. Excellent hemostasis post-procedurally 4. All visualized areas of abnormal urothelium were resected or fulgurated  Procedure Details: The patient and consent was verified in the pre-op holding area and brought to the operating room where they were placed on the operating table. Pre-induction time out was called and general anesthesia was induced. SCDs were  placed and IV antibiotics were started.   We began by placing a 22.5 Fr cystoscope per urethra with ample lubrication and normal saline running. Pan cystoscopy was performed with the findings as documented above.  We then switched to our 26 [place] resectoscope. Initial placement was tight at the fossa navicularis so we performed urethra dilation with Leander Rams sounds up to 30 Fr. We then inserted the resectoscope with Beatrix Fetters working element and electrocautery loop with which we performed transurethral resection of the bladder tumor. Paying careful attention to avoid the ureteral orifices, we carefully resected down to a depth of muscularis propria. We achieved satisfactory hemostasis along the way using bipolar electrocautery. Once complete, we irrigated and evacuated the patient's bladder several times using bladder filling and evacuationand all resection chips were sent for Pathology labeled "bladder tumor for pathology". We then focused on the peri-trigonal areas and performed deep biopsies with the [place] rigid biopsy forceps.We then fulgurated theperiphery of our resection site circumferentially.Given the high grade appearance of the tumor we elected that the risks of Mitomycin C instillation outweighed the benefits. We then ensuredsatisfactory hemostasis with the bladder relatively empty. At this point, bladder was emptied and all instrumentation was removed.Draining urine was clear and without clot. A pelvic exam under anesthesia was performed.  There was firmness over the right lower abdomen consistent with his known abdominal wall seroma s/p recent hernia repair which rendered his exam somewhat inaccurate.  No left sided pelvic mass or rectal mass was appreciated.  His prostate was not palpable consistent with his surgical history. Viscous lidocaine was instilled per urethra. The patient was then awoken from anesthesia and taken to recovery area.  Post-operative Plan: 1. Discharge home per  PACU protocol once voids x1  2. Follow-up with Dr. Alinda Money pending pathology   Teaching Physician Attestation: Dr. Alinda Money was present and scrubbed for the entirety of the procedure  Pietro Cassis. Ottis Stain, MD Resident [facility] Department of Urologic Surgery/[facility]

## 2015-05-09 NOTE — Discharge Instructions (Signed)
1. You may see some blood in the urine and may have some burning with urination for 48-72 hours. You also may notice that you have to urinate more frequently or urgently after your procedure which is normal.  °2. You should call should you develop an inability urinate, fever > 101, persistent nausea and vomiting that prevents you from eating or drinking to stay hydrated.  °

## 2015-05-09 NOTE — Transfer of Care (Signed)
Immediate Anesthesia Transfer of Care Note  Patient: Ronald Lowery  Procedure(s) Performed: Procedure(s): CYSTOSCOPY WITH LEFT URETERAL CANNULATION (Bilateral) TRANSURETHRAL RESECTION OF BLADDER TUMOR (TURBT) (N/A)  Patient Location: PACU  Anesthesia Type:General  Level of Consciousness:  sedated, patient cooperative and responds to stimulation  Airway & Oxygen Therapy:Patient Spontanous Breathing and Patient connected to face mask oxgen  Post-op Assessment:  Report given to PACU RN and Post -op Vital signs reviewed and stable  Post vital signs:  Reviewed and stable  Last Vitals:  Filed Vitals:   05/09/15 1417  BP: 143/79  Pulse: 73  Temp: 36.6 C  Resp: 18    Complications: No apparent anesthesia complications

## 2015-05-09 NOTE — Anesthesia Procedure Notes (Signed)
Procedure Name: Intubation Date/Time: 05/09/2015 3:45 PM Performed by: Lajuana Carry E Pre-anesthesia Checklist: Patient identified, Emergency Drugs available, Suction available and Patient being monitored Patient Re-evaluated:Patient Re-evaluated prior to inductionOxygen Delivery Method: Circle System Utilized Preoxygenation: Pre-oxygenation with 100% oxygen Intubation Type: IV induction Ventilation: Mask ventilation without difficulty Laryngoscope Size: Mac and 3 Grade View: Grade II Tube type: Oral Tube size: 7.5 mm Number of attempts: 1 Airway Equipment and Method: Stylet Placement Confirmation: ETT inserted through vocal cords under direct vision,  positive ETCO2 and breath sounds checked- equal and bilateral Secured at: 21 cm Tube secured with: Tape Dental Injury: Teeth and Oropharynx as per pre-operative assessment

## 2015-05-09 NOTE — Anesthesia Postprocedure Evaluation (Signed)
  Anesthesia Post-op Note  Patient: Ronald Lowery  Procedure(s) Performed: Procedure(s) (LRB): CYSTOSCOPY WITH LEFT URETERAL CANNULATION (Bilateral) TRANSURETHRAL RESECTION OF BLADDER TUMOR (TURBT) (N/A)  Patient Location: PACU  Anesthesia Type: General  Level of Consciousness: awake and alert   Airway and Oxygen Therapy: Patient Spontanous Breathing  Post-op Pain: mild  Post-op Assessment: Post-op Vital signs reviewed, Patient's Cardiovascular Status Stable, Respiratory Function Stable, Patent Airway and No signs of Nausea or vomiting  Last Vitals:  Filed Vitals:   05/09/15 1739  BP: 140/69  Pulse: 64  Temp: 36.4 C  Resp: 15    Post-op Vital Signs: stable   Complications: No apparent anesthesia complications

## 2015-05-09 NOTE — Anesthesia Preprocedure Evaluation (Signed)
Anesthesia Evaluation  Patient identified by MRN, date of birth, ID band Patient awake    Reviewed: Allergy & Precautions, NPO status , Patient's Chart, lab work & pertinent test results  History of Anesthesia Complications (+) PROLONGED EMERGENCE and history of anesthetic complications  Airway Mallampati: II  TM Distance: >3 FB Neck ROM: Full    Dental no notable dental hx.    Pulmonary neg pulmonary ROS,  breath sounds clear to auscultation  Pulmonary exam normal       Cardiovascular Exercise Tolerance: Good hypertension, Pt. on medications Normal cardiovascular examRhythm:Regular Rate:Normal     Neuro/Psych negative neurological ROS  negative psych ROS   GI/Hepatic Neg liver ROS, GERD-  Medicated,  Endo/Other  Hypothyroidism   Renal/GU negative Renal ROS  negative genitourinary   Musculoskeletal  (+) Arthritis -,   Abdominal   Peds negative pediatric ROS (+)  Hematology negative hematology ROS (+)   Anesthesia Other Findings   Reproductive/Obstetrics negative OB ROS                             Anesthesia Physical Anesthesia Plan  ASA: II  Anesthesia Plan: General   Post-op Pain Management:    Induction: Intravenous  Airway Management Planned: Oral ETT  Additional Equipment:   Intra-op Plan:   Post-operative Plan: Extubation in OR  Informed Consent: I have reviewed the patients History and Physical, chart, labs and discussed the procedure including the risks, benefits and alternatives for the proposed anesthesia with the patient or authorized representative who has indicated his/her understanding and acceptance.   Dental advisory given  Plan Discussed with: CRNA  Anesthesia Plan Comments: (Dr. Alinda Money requests muscle relaxation for procedure. Plan OETT.)        Anesthesia Quick Evaluation

## 2015-05-10 ENCOUNTER — Encounter (HOSPITAL_COMMUNITY): Payer: Self-pay | Admitting: Urology

## 2015-05-16 ENCOUNTER — Telehealth: Payer: Self-pay | Admitting: Radiology

## 2015-05-16 NOTE — Telephone Encounter (Signed)
Pt wanted to schedule an injection. Called and explained he would need a referral from his physician and we would call him as soon as we got the referral and schedule him with Dr. Jobe Igo

## 2015-05-18 DIAGNOSIS — M7989 Other specified soft tissue disorders: Secondary | ICD-10-CM | POA: Diagnosis not present

## 2015-05-18 DIAGNOSIS — E039 Hypothyroidism, unspecified: Secondary | ICD-10-CM | POA: Diagnosis not present

## 2015-05-18 DIAGNOSIS — I872 Venous insufficiency (chronic) (peripheral): Secondary | ICD-10-CM | POA: Diagnosis not present

## 2015-05-18 DIAGNOSIS — Z6823 Body mass index (BMI) 23.0-23.9, adult: Secondary | ICD-10-CM | POA: Diagnosis not present

## 2015-05-18 DIAGNOSIS — D494 Neoplasm of unspecified behavior of bladder: Secondary | ICD-10-CM | POA: Diagnosis not present

## 2015-05-18 DIAGNOSIS — F329 Major depressive disorder, single episode, unspecified: Secondary | ICD-10-CM | POA: Diagnosis not present

## 2015-05-18 DIAGNOSIS — M545 Low back pain: Secondary | ICD-10-CM | POA: Diagnosis not present

## 2015-05-18 DIAGNOSIS — I10 Essential (primary) hypertension: Secondary | ICD-10-CM | POA: Diagnosis not present

## 2015-05-18 DIAGNOSIS — K219 Gastro-esophageal reflux disease without esophagitis: Secondary | ICD-10-CM | POA: Diagnosis not present

## 2015-05-18 DIAGNOSIS — C61 Malignant neoplasm of prostate: Secondary | ICD-10-CM | POA: Diagnosis not present

## 2015-05-18 DIAGNOSIS — E785 Hyperlipidemia, unspecified: Secondary | ICD-10-CM | POA: Diagnosis not present

## 2015-05-19 DIAGNOSIS — M419 Scoliosis, unspecified: Secondary | ICD-10-CM | POA: Diagnosis not present

## 2015-05-19 DIAGNOSIS — I872 Venous insufficiency (chronic) (peripheral): Secondary | ICD-10-CM | POA: Diagnosis not present

## 2015-05-19 DIAGNOSIS — M7989 Other specified soft tissue disorders: Secondary | ICD-10-CM | POA: Diagnosis not present

## 2015-05-19 DIAGNOSIS — M5126 Other intervertebral disc displacement, lumbar region: Secondary | ICD-10-CM | POA: Diagnosis not present

## 2015-05-19 DIAGNOSIS — M5137 Other intervertebral disc degeneration, lumbosacral region: Secondary | ICD-10-CM | POA: Diagnosis not present

## 2015-05-19 DIAGNOSIS — M47817 Spondylosis without myelopathy or radiculopathy, lumbosacral region: Secondary | ICD-10-CM | POA: Diagnosis not present

## 2015-05-19 DIAGNOSIS — M4806 Spinal stenosis, lumbar region: Secondary | ICD-10-CM | POA: Diagnosis not present

## 2015-05-19 DIAGNOSIS — M5136 Other intervertebral disc degeneration, lumbar region: Secondary | ICD-10-CM | POA: Diagnosis not present

## 2015-05-19 DIAGNOSIS — M5127 Other intervertebral disc displacement, lumbosacral region: Secondary | ICD-10-CM | POA: Diagnosis not present

## 2015-05-19 DIAGNOSIS — M47816 Spondylosis without myelopathy or radiculopathy, lumbar region: Secondary | ICD-10-CM | POA: Diagnosis not present

## 2015-05-31 DIAGNOSIS — R310 Gross hematuria: Secondary | ICD-10-CM | POA: Diagnosis not present

## 2015-05-31 DIAGNOSIS — D090 Carcinoma in situ of bladder: Secondary | ICD-10-CM | POA: Diagnosis not present

## 2015-06-01 DIAGNOSIS — M7989 Other specified soft tissue disorders: Secondary | ICD-10-CM | POA: Diagnosis not present

## 2015-06-01 DIAGNOSIS — Z6822 Body mass index (BMI) 22.0-22.9, adult: Secondary | ICD-10-CM | POA: Diagnosis not present

## 2015-06-01 DIAGNOSIS — I10 Essential (primary) hypertension: Secondary | ICD-10-CM | POA: Diagnosis not present

## 2015-06-10 DIAGNOSIS — M4125 Other idiopathic scoliosis, thoracolumbar region: Secondary | ICD-10-CM | POA: Diagnosis not present

## 2015-06-10 DIAGNOSIS — M4806 Spinal stenosis, lumbar region: Secondary | ICD-10-CM | POA: Diagnosis not present

## 2015-06-10 DIAGNOSIS — M1612 Unilateral primary osteoarthritis, left hip: Secondary | ICD-10-CM | POA: Diagnosis not present

## 2015-06-10 DIAGNOSIS — I10 Essential (primary) hypertension: Secondary | ICD-10-CM | POA: Diagnosis not present

## 2015-06-16 DIAGNOSIS — Z5111 Encounter for antineoplastic chemotherapy: Secondary | ICD-10-CM | POA: Diagnosis not present

## 2015-06-16 DIAGNOSIS — D090 Carcinoma in situ of bladder: Secondary | ICD-10-CM | POA: Diagnosis not present

## 2015-06-23 DIAGNOSIS — C675 Malignant neoplasm of bladder neck: Secondary | ICD-10-CM | POA: Diagnosis not present

## 2015-06-23 DIAGNOSIS — Z5111 Encounter for antineoplastic chemotherapy: Secondary | ICD-10-CM | POA: Diagnosis not present

## 2015-06-24 DIAGNOSIS — M25552 Pain in left hip: Secondary | ICD-10-CM | POA: Diagnosis not present

## 2015-06-24 DIAGNOSIS — M1612 Unilateral primary osteoarthritis, left hip: Secondary | ICD-10-CM | POA: Diagnosis not present

## 2015-06-30 DIAGNOSIS — Z5111 Encounter for antineoplastic chemotherapy: Secondary | ICD-10-CM | POA: Diagnosis not present

## 2015-06-30 DIAGNOSIS — C675 Malignant neoplasm of bladder neck: Secondary | ICD-10-CM | POA: Diagnosis not present

## 2015-07-07 DIAGNOSIS — Z5111 Encounter for antineoplastic chemotherapy: Secondary | ICD-10-CM | POA: Diagnosis not present

## 2015-07-07 DIAGNOSIS — D090 Carcinoma in situ of bladder: Secondary | ICD-10-CM | POA: Diagnosis not present

## 2015-07-14 DIAGNOSIS — C675 Malignant neoplasm of bladder neck: Secondary | ICD-10-CM | POA: Diagnosis not present

## 2015-07-14 DIAGNOSIS — Z5111 Encounter for antineoplastic chemotherapy: Secondary | ICD-10-CM | POA: Diagnosis not present

## 2015-07-19 DIAGNOSIS — M1990 Unspecified osteoarthritis, unspecified site: Secondary | ICD-10-CM | POA: Diagnosis not present

## 2015-07-21 DIAGNOSIS — D090 Carcinoma in situ of bladder: Secondary | ICD-10-CM | POA: Diagnosis not present

## 2015-07-21 DIAGNOSIS — C675 Malignant neoplasm of bladder neck: Secondary | ICD-10-CM | POA: Diagnosis not present

## 2015-07-26 DIAGNOSIS — M1612 Unilateral primary osteoarthritis, left hip: Secondary | ICD-10-CM | POA: Diagnosis not present

## 2015-07-28 DIAGNOSIS — M1612 Unilateral primary osteoarthritis, left hip: Secondary | ICD-10-CM | POA: Diagnosis not present

## 2015-08-02 DIAGNOSIS — M1612 Unilateral primary osteoarthritis, left hip: Secondary | ICD-10-CM | POA: Diagnosis not present

## 2015-08-03 DIAGNOSIS — Z23 Encounter for immunization: Secondary | ICD-10-CM | POA: Diagnosis not present

## 2015-08-04 NOTE — Patient Instructions (Addendum)
YOUR PROCEDURE IS SCHEDULED ON :  08/16/15  REPORT TO [facility] MAIN ENTRANCE FOLLOW SIGNS TO EAST ELEVATOR - GO TO 3rd FLOOR CHECK IN AT 3 EAST NURSES STATION (SHORT STAY) AT:  5:30 AM  CALL THIS NUMBER IF YOU HAVE PROBLEMS THE MORNING OF SURGERY [phone]  REMEMBER:ONLY 1 PER PERSON MAY GO TO SHORT STAY WITH YOU TO GET READY THE MORNING OF YOUR SURGERY  DO NOT EAT FOOD OR DRINK LIQUIDS AFTER MIDNIGHT  TAKE THESE MEDICINES THE MORNING OF SURGERY: OMEPRAZOLE / SYNTHROID / MAY TAKE OXYCODONE / MAY TAKE LORATADINE IF NEEDED  YOU MAY NOT HAVE ANY METAL ON YOUR BODY INCLUDING HAIR PINS AND PIERCING'S. DO NOT WEAR JEWELRY, MAKEUP, LOTIONS, POWDERS OR PERFUMES. DO NOT WEAR NAIL POLISH. DO NOT SHAVE 48 HRS PRIOR TO SURGERY. MEN MAY SHAVE FACE AND NECK.  DO NOT [place]. [facility] IS NOT RESPONSIBLE FOR VALUABLES.  CONTACTS, DENTURES OR PARTIALS MAY NOT BE WORN TO SURGERY. LEAVE SUITCASE IN CAR. CAN BE BROUGHT TO ROOM AFTER SURGERY.  PATIENTS DISCHARGED THE DAY OF SURGERY WILL NOT BE ALLOWED TO DRIVE HOME.  PLEASE READ OVER THE FOLLOWING INSTRUCTION SHEETS _________________________________________________________________________________                                          Ronald Lowery - PREPARING FOR SURGERY  Before surgery, you can play an important role.  Because skin is not sterile, your skin needs to be as free of germs as possible.  You can reduce the number of germs on your skin by washing with CHG (chlorahexidine gluconate) soap before surgery.  CHG is an antiseptic cleaner which kills germs and bonds with the skin to continue killing germs even after washing. Please DO NOT use if you have an allergy to CHG or antibacterial soaps.  If your skin becomes reddened/irritated stop using the CHG and inform your nurse when you arrive at Short Stay. Do not shave (including legs and underarms) for at least 48 hours prior to the first CHG shower.   You may shave your face. Please follow these instructions carefully:   1.  Shower with CHG Soap the night before surgery and the  morning of Surgery.   2.  If you choose to wash your hair, wash your hair first as usual with your  normal  Shampoo.   3.  After you shampoo, rinse your hair and body thoroughly to remove the  shampoo.                                         4.  Use CHG as you would any other liquid soap.  You can apply chg directly  to the skin and wash . Gently wash with scrungie or clean wascloth    5.  Apply the CHG Soap to your body ONLY FROM THE NECK DOWN.   Do not use on open                           Wound or open sores. Avoid contact with eyes, ears mouth and genitals (private parts).  Genitals (private parts) with your normal soap.              6.  Wash thoroughly, paying special attention to the area where your surgery  will be performed.   7.  Thoroughly rinse your body with warm water from the neck down.   8.  DO NOT shower/wash with your normal soap after using and rinsing off  the CHG Soap .                9.  Pat yourself dry with a clean towel.             10.  Wear clean night clothes to bed after shower             11.  Place clean sheets on your bed the night of your first shower and do not  sleep with pets.  Day of Surgery : Do not apply any lotions/deodorants the morning of surgery.  Please wear clean clothes to the hospital/surgery center.  FAILURE TO FOLLOW THESE INSTRUCTIONS MAY RESULT IN THE CANCELLATION OF YOUR SURGERY    PATIENT SIGNATURE_________________________________  ______________________________________________________________________     Ronald Lowery  An incentive spirometer is a tool that can help keep your lungs clear and active. This tool measures how well you are filling your lungs with each breath. Taking long deep breaths may help reverse or decrease the chance of developing breathing  (pulmonary) problems (especially infection) following:  A long period of time when you are unable to move or be active. BEFORE THE PROCEDURE   If the spirometer includes an indicator to show your best effort, your nurse or respiratory therapist will set it to a desired goal.  If possible, sit up straight or lean slightly forward. Try not to slouch.  Hold the incentive spirometer in an upright position. INSTRUCTIONS FOR USE   Sit on the edge of your bed if possible, or sit up as far as you can in bed or on a chair.  Hold the incentive spirometer in an upright position.  Breathe out normally.  Place the mouthpiece in your mouth and seal your lips tightly around it.  Breathe in slowly and as deeply as possible, raising the piston or the ball toward the top of the column.  Hold your breath for 3-5 seconds or for as long as possible. Allow the piston or ball to fall to the bottom of the column.  Remove the mouthpiece from your mouth and breathe out normally.  Rest for a few seconds and repeat Steps 1 through 7 at least 10 times every 1-2 hours when you are awake. Take your time and take a few normal breaths between deep breaths.  The spirometer may include an indicator to show your best effort. Use the indicator as a goal to work toward during each repetition.  After each set of 10 deep breaths, practice coughing to be sure your lungs are clear. If you have an incision (the cut made at the time of surgery), support your incision when coughing by placing a pillow or rolled up towels firmly against it. Once you are able to get out of bed, walk around indoors and cough well. You may stop using the incentive spirometer when instructed by your caregiver.  RISKS AND COMPLICATIONS  Take your time so you do not get dizzy or light-headed.  If you are in pain, you may need to take or ask for pain medication before doing incentive spirometry. It is  harder to take a deep breath if you are having  pain. AFTER USE  Rest and breathe slowly and easily.  It can be helpful to keep track of a log of your progress. Your caregiver can provide you with a simple table to help with this. If you are using the spirometer at home, follow these instructions: [place] IF:   You are having difficultly using the spirometer.  You have trouble using the spirometer as often as instructed.  Your pain medication is not giving enough relief while using the spirometer.  You develop fever of 100.5 F (38.1 C) or higher. SEEK IMMEDIATE MEDICAL CARE IF:   You cough up bloody sputum that had not been present before.  You develop fever of 102 F (38.9 C) or greater.  You develop worsening pain at or near the incision site. MAKE SURE YOU:   Understand these instructions.  Will watch your condition.  Will get help right away if you are not doing well or get worse. Document Released: 02/11/2007 Document Revised: 12/24/2011 Document Reviewed: 04/14/2007 ExitCare Patient Information 2014 ExitCare, [place].   ________________________________________________________________________  WHAT IS A BLOOD TRANSFUSION? Blood Transfusion Information  A transfusion is the replacement of blood or some of its parts. Blood is made up of multiple cells which provide different functions.  Red blood cells carry oxygen and are used for blood loss replacement.  White blood cells fight against infection.  Platelets control bleeding.  Plasma helps clot blood.  Other blood products are available for specialized needs, such as hemophilia or other clotting disorders. BEFORE THE TRANSFUSION  Who gives blood for transfusions?   Healthy volunteers who are fully evaluated to make sure their blood is safe. This is blood bank blood. Transfusion therapy is the safest it has ever been in the practice of medicine. Before blood is taken from a donor, a complete history is taken to make sure that person has no history  of diseases nor engages in risky social behavior (examples are intravenous drug use or sexual activity with multiple partners). The donor's travel history is screened to minimize risk of transmitting infections, such as malaria. The donated blood is tested for signs of infectious diseases, such as HIV and hepatitis. The blood is then tested to be sure it is compatible with you in order to minimize the chance of a transfusion reaction. If you or a relative donates blood, this is often done in anticipation of surgery and is not appropriate for emergency situations. It takes many days to process the donated blood. RISKS AND COMPLICATIONS Although transfusion therapy is very safe and saves many lives, the main dangers of transfusion include:   Getting an infectious disease.  Developing a transfusion reaction. This is an allergic reaction to something in the blood you were given. Every precaution is taken to prevent this. The decision to have a blood transfusion has been considered carefully by your caregiver before blood is given. Blood is not given unless the benefits outweigh the risks. AFTER THE TRANSFUSION  Right after receiving a blood transfusion, you will usually feel much better and more energetic. This is especially true if your red blood cells have gotten low (anemic). The transfusion raises the level of the red blood cells which carry oxygen, and this usually causes an energy increase.  The nurse administering the transfusion will monitor you carefully for complications. HOME CARE INSTRUCTIONS  No special instructions are needed after a transfusion. You may find your energy is better. Speak  with your caregiver about any limitations on activity for underlying diseases you may have. SEEK MEDICAL CARE IF:   Your condition is not improving after your transfusion.  You develop redness or irritation at the intravenous (IV) site. SEEK IMMEDIATE MEDICAL CARE IF:  Any of the following symptoms  occur over the next 12 hours:  Shaking chills.  You have a temperature by mouth above 102 F (38.9 C), not controlled by medicine.  Chest, back, or muscle pain.  People around you feel you are not acting correctly or are confused.  Shortness of breath or difficulty breathing.  Dizziness and fainting.  You get a rash or develop hives.  You have a decrease in urine output.  Your urine turns a dark color or changes to pink, red, or brown. Any of the following symptoms occur over the next 10 days:  You have a temperature by mouth above 102 F (38.9 C), not controlled by medicine.  Shortness of breath.  Weakness after normal activity.  The white part of the eye turns yellow (jaundice).  You have a decrease in the amount of urine or are urinating less often.  Your urine turns a dark color or changes to pink, red, or brown. Document Released: 09/28/2000 Document Revised: 12/24/2011 Document Reviewed: 05/17/2008 [facility] Patient Information 2014 [place], [place].  _______________________________________________________________________

## 2015-08-05 ENCOUNTER — Encounter (HOSPITAL_COMMUNITY): Payer: Self-pay

## 2015-08-05 ENCOUNTER — Encounter (HOSPITAL_COMMUNITY)
Admission: RE | Admit: 2015-08-05 | Discharge: 2015-08-05 | Disposition: A | Discharge status: Home / Self Care | Payer: Medicare Other | Source: Ambulatory Visit | Attending: Orthopedic Surgery | Admitting: Orthopedic Surgery

## 2015-08-05 DIAGNOSIS — Z7901 Long term (current) use of anticoagulants: Secondary | ICD-10-CM | POA: Diagnosis not present

## 2015-08-05 DIAGNOSIS — Z01818 Encounter for other preprocedural examination: Secondary | ICD-10-CM | POA: Insufficient documentation

## 2015-08-05 DIAGNOSIS — M1612 Unilateral primary osteoarthritis, left hip: Secondary | ICD-10-CM | POA: Insufficient documentation

## 2015-08-05 HISTORY — DX: Anxiety disorder, unspecified: F41.9

## 2015-08-05 HISTORY — DX: Nocturia: R35.1

## 2015-08-05 HISTORY — DX: Personal history of malignant neoplasm of prostate: Z85.46

## 2015-08-05 HISTORY — DX: Major depressive disorder, single episode, unspecified: F32.9

## 2015-08-05 HISTORY — DX: Personal history of malignant neoplasm of bladder: Z85.51

## 2015-08-05 HISTORY — DX: Depression, unspecified: F32.A

## 2015-08-05 HISTORY — DX: Personal history of other malignant neoplasm of skin: Z85.828

## 2015-08-05 LAB — BASIC METABOLIC PANEL
Anion gap: 4 — ABNORMAL LOW (ref 5–15)
BUN: 8 mg/dL (ref 6–20)
CHLORIDE: 98 mmol/L — AB (ref 101–111)
CO2: 31 mmol/L (ref 22–32)
CREATININE: 0.81 mg/dL (ref 0.61–1.24)
Calcium: 9.2 mg/dL (ref 8.9–10.3)
GFR calc non Af Amer: >60 mL/min (ref >60)
Glucose, Bld: 65 mg/dL (ref 65–99)
POTASSIUM: 4.5 mmol/L (ref 3.5–5.1)
SODIUM: 133 mmol/L — AB (ref 135–145)

## 2015-08-05 LAB — URINALYSIS, ROUTINE W REFLEX MICROSCOPIC
BILIRUBIN URINE: NEGATIVE
Glucose, UA: NEGATIVE mg/dL
KETONES UR: NEGATIVE mg/dL
NITRITE: NEGATIVE
PROTEIN: NEGATIVE mg/dL
Specific Gravity, Urine: 1.006 (ref 1.005–1.030)
UROBILINOGEN UA: 0.2 mg/dL (ref 0.0–1.0)
pH: 6.5 (ref 5.0–8.0)

## 2015-08-05 LAB — CBC
HEMATOCRIT: 38.1 % — AB (ref 39.0–52.0)
Hemoglobin: 12.6 g/dL — ABNORMAL LOW (ref 13.0–17.0)
MCH: 30.7 pg (ref 26.0–34.0)
MCHC: 33.1 g/dL (ref 30.0–36.0)
MCV: 92.9 fL (ref 78.0–100.0)
Platelets: 275 10*3/uL (ref 150–400)
RBC: 4.1 MIL/uL — AB (ref 4.22–5.81)
RDW: 12.8 % (ref 11.5–15.5)
WBC: 5.9 10*3/uL (ref 4.0–10.5)

## 2015-08-05 LAB — URINE MICROSCOPIC-ADD ON

## 2015-08-05 LAB — TYPE AND SCREEN
ABO/RH(D): O POS
ANTIBODY SCREEN: NEGATIVE

## 2015-08-05 LAB — SURGICAL PCR SCREEN
MRSA, PCR: NEGATIVE
Staphylococcus aureus: NEGATIVE

## 2015-08-05 LAB — PROTIME-INR
INR: 1.02 (ref 0.00–1.49)
Prothrombin Time: 13.6 seconds (ref 11.6–15.2)

## 2015-08-05 LAB — ABO/RH: ABO/RH(D): O POS

## 2015-08-05 LAB — APTT: aPTT: 31 seconds (ref 24–37)

## 2015-08-05 NOTE — Progress Notes (Signed)
Abnormal UA faxed to Dr.Olin 

## 2015-08-09 NOTE — Progress Notes (Signed)
Received fax from office - Rx Cipro 500 mg BID x 4 days  called to [facility]

## 2015-08-10 NOTE — H&P (Signed)
TOTAL HIP ADMISSION H&P  Patient is admitted for left total hip arthroplasty, anterior approach.  Subjective:  Chief Complaint:    Left hip primary OA / pain  HPI: Ronald Lowery, 74 y.o. male, has a history of pain and functional disability in the left hip(s) due to arthritis and patient has failed non-surgical conservative treatments for greater than 12 weeks to include NSAID's and/or analgesics, use of assistive devices and activity modification.  Onset of symptoms was gradual starting 1+ years ago with gradually worsening course since that time.The patient noted no past surgery on the left hip(s).  Patient currently rates pain in the left hip at 7 out of 10 with activity. Patient has night pain, worsening of pain with activity and weight bearing, trendelenberg gait, pain that interfers with activities of daily living and pain with passive range of motion. Patient has evidence of periarticular osteophytes and joint space narrowing by imaging studies. This condition presents safety issues increasing the risk of falls.  There is no current active infection.  Risks, benefits and expectations were discussed with the patient.  Risks including but not limited to the risk of anesthesia, blood clots, nerve damage, blood vessel damage, failure of the prosthesis, infection and up to and including death.  Patient understand the risks, benefits and expectations and wishes to proceed with surgery.   PCP: Geoffery Lyons, MD  D/C Plans:      Home with HHPT  Post-op Meds:       No Rx given   Tranexamic Acid:      To be given - IV   Decadron:      Is to be given  FYI:     Xarelto post-op  ( unable to take ASA)  Oxycodone post-op   No 3-n-1   Past Medical History  Diagnosis Date  . Environmental allergies   . Hypothyroidism   . Hypertension   . Scoliosis   . Urgency of urination   . Cataract     bilateral-may be removed 05/2015  . GERD (gastroesophageal reflux disease)   . DDD (degenerative  disc disease), lumbar     L5-steroid injection 01/2015  . Arthritis   . Complication of anesthesia     "took a while to wake up with triple hernia repair"  . History of skin cancer   . Nocturia   . History of bladder cancer   . History of prostate cancer   . Basal cell carcinoma 2004    x1-face  . Squamous carcinoma ([place]) 2008    x2- neck, face  . Anxiety   . Depression     Past Surgical History  Procedure Laterality Date  . Appendectomy  1955  . Foot surgery  1980's    Morton's neuroma x3  . Skin cancer destruction  2004    basal cell carcinoma  . Prostatectomy  2010  . Esophagogastroduodenoscopy (egd) with esophageal dilation  2004  . Vein surgery Left 2011    leg  . Hernia repair      triple laproscopic  . Tonsillectomy  ~1949  . Mass biopsy      bladder tumor biopsy  . Cystoscopy w/ retrogrades Bilateral 05/09/2015    Procedure: CYSTOSCOPY WITH LEFT URETERAL CANNULATION;  Surgeon: Raynelle Bring, MD;  Location: [facility];  Service: Urology;  Laterality: Bilateral;  . Transurethral resection of bladder tumor N/A 05/09/2015    Procedure: TRANSURETHRAL RESECTION OF BLADDER TUMOR (TURBT);  Surgeon: Raynelle Bring, MD;  Location: [facility];  Service: Urology;  Laterality: N/A;    No prescriptions prior to admission   Allergies  Allergen Reactions  . Cefdinir     Muscle pain    Social History  Substance Use Topics  . Smoking status: Never Smoker   . Smokeless tobacco: Never Used  . Alcohol Use: No    Family History  Problem Relation Age of Onset  . Colon cancer Maternal Aunt   . Esophageal cancer Neg Hx   . Stomach cancer Neg Hx   . Rectal cancer Neg Hx      Review of Systems  Constitutional: Negative.   HENT: Negative.   Eyes: Negative.   Respiratory: Negative.   Cardiovascular: Negative.   Gastrointestinal: Positive for heartburn.  Genitourinary: Positive for frequency.  Musculoskeletal: Positive for back pain and joint pain.  Skin: Negative.   Neurological:  Negative.   Endo/Heme/Allergies: Positive for environmental allergies.  Psychiatric/Behavioral: Positive for depression. The patient is nervous/anxious.     Objective:  Physical Exam  Constitutional: He is oriented to person, place, and time. He appears well-developed.  HENT:  Head: Normocephalic.  Eyes: Pupils are equal, round, and reactive to light.  Neck: Neck supple. No JVD present. No tracheal deviation present. No thyromegaly present.  Cardiovascular: Normal rate, regular rhythm, normal heart sounds and intact distal pulses.   Respiratory: Effort normal and breath sounds normal. No stridor. No respiratory distress. He has no wheezes.  GI: Soft. There is no tenderness. There is no guarding.  Musculoskeletal:       Left hip: He exhibits decreased range of motion, decreased strength, tenderness and bony tenderness. He exhibits no swelling, no deformity and no laceration.  Lymphadenopathy:    He has no cervical adenopathy.  Neurological: He is alert and oriented to person, place, and time.  Skin: Skin is warm and dry.  Psychiatric: He has a normal mood and affect.      Labs:  Estimated body mass index is 22.81 kg/(m^2) as calculated from the following:   Height as of 05/03/15: 6' 3.5" (1.918 m).   Weight as of 05/03/15: 83.915 kg (185 lb).   Imaging Review Plain radiographs demonstrate severe degenerative joint disease of the left hip(s). The bone quality appears to be good for age and reported activity level.  Assessment/Plan:  End stage arthritis, left hip(s)  The patient history, physical examination, clinical judgement of the provider and imaging studies are consistent with end stage degenerative joint disease of the left hip(s) and total hip arthroplasty is deemed medically necessary. The treatment options including medical management, injection therapy, arthroscopy and arthroplasty were discussed at length. The risks and benefits of total hip arthroplasty were presented  and reviewed. The risks due to aseptic loosening, infection, stiffness, dislocation/subluxation,  thromboembolic complications and other imponderables were discussed.  The patient acknowledged the explanation, agreed to proceed with the plan and consent was signed. Patient is being admitted for inpatient treatment for surgery, pain control, PT, OT, prophylactic antibiotics, VTE prophylaxis, progressive ambulation and ADL's and discharge planning.The patient is planning to be discharged home with home health services.     Ronald Pugh Laithan Conchas   PA-C  08/10/2015, 1:37 PM

## 2015-08-16 ENCOUNTER — Inpatient Hospital Stay (HOSPITAL_COMMUNITY): Payer: Medicare Other

## 2015-08-16 ENCOUNTER — Encounter (HOSPITAL_COMMUNITY)
Admission: RE | Disposition: A | Discharge status: Home Health | Payer: Self-pay | Source: Ambulatory Visit | Attending: Orthopedic Surgery

## 2015-08-16 ENCOUNTER — Encounter (HOSPITAL_COMMUNITY): Payer: Self-pay | Admitting: Anesthesiology

## 2015-08-16 ENCOUNTER — Inpatient Hospital Stay (HOSPITAL_COMMUNITY): Payer: Medicare Other | Admitting: Anesthesiology

## 2015-08-16 ENCOUNTER — Inpatient Hospital Stay (HOSPITAL_COMMUNITY)
Admission: RE | Admit: 2015-08-16 | Discharge: 2015-08-17 | DRG: 470 | Disposition: A | Discharge status: Home Health | Payer: Medicare Other | Source: Ambulatory Visit | Attending: Orthopedic Surgery | Admitting: Orthopedic Surgery

## 2015-08-16 DIAGNOSIS — I10 Essential (primary) hypertension: Secondary | ICD-10-CM | POA: Diagnosis present

## 2015-08-16 DIAGNOSIS — M25552 Pain in left hip: Secondary | ICD-10-CM | POA: Diagnosis not present

## 2015-08-16 DIAGNOSIS — Z8546 Personal history of malignant neoplasm of prostate: Secondary | ICD-10-CM

## 2015-08-16 DIAGNOSIS — Z8551 Personal history of malignant neoplasm of bladder: Secondary | ICD-10-CM

## 2015-08-16 DIAGNOSIS — M169 Osteoarthritis of hip, unspecified: Secondary | ICD-10-CM | POA: Diagnosis not present

## 2015-08-16 DIAGNOSIS — E039 Hypothyroidism, unspecified: Secondary | ICD-10-CM | POA: Diagnosis present

## 2015-08-16 DIAGNOSIS — Z96649 Presence of unspecified artificial hip joint: Secondary | ICD-10-CM

## 2015-08-16 DIAGNOSIS — Z85828 Personal history of other malignant neoplasm of skin: Secondary | ICD-10-CM

## 2015-08-16 DIAGNOSIS — M1612 Unilateral primary osteoarthritis, left hip: Secondary | ICD-10-CM | POA: Diagnosis present

## 2015-08-16 DIAGNOSIS — Z01812 Encounter for preprocedural laboratory examination: Secondary | ICD-10-CM | POA: Diagnosis not present

## 2015-08-16 DIAGNOSIS — F418 Other specified anxiety disorders: Secondary | ICD-10-CM | POA: Diagnosis present

## 2015-08-16 DIAGNOSIS — K219 Gastro-esophageal reflux disease without esophagitis: Secondary | ICD-10-CM | POA: Diagnosis present

## 2015-08-16 HISTORY — PX: TOTAL HIP ARTHROPLASTY: SHX124

## 2015-08-16 SURGERY — ARTHROPLASTY, HIP, TOTAL, ANTERIOR APPROACH
Anesthesia: Spinal | Site: Hip | Laterality: Left

## 2015-08-16 MED ORDER — DEXAMETHASONE SODIUM PHOSPHATE 10 MG/ML IJ SOLN
INTRAMUSCULAR | Status: AC
Start: 2015-08-16 — End: 2015-08-16
  Filled 2015-08-16: qty 1

## 2015-08-16 MED ORDER — LACTATED RINGERS IV SOLN
INTRAVENOUS | Status: DC
  Administered 2015-08-16 (×3): via INTRAVENOUS

## 2015-08-16 MED ORDER — FERROUS SULFATE 325 (65 FE) MG PO TABS
325.0000 mg | ORAL_TABLET | Freq: Three times a day (TID) | ORAL | Status: DC
  Administered 2015-08-16 – 2015-08-17 (×2): 325 mg via ORAL
  Filled 2015-08-16 (×5): qty 1

## 2015-08-16 MED ORDER — FENTANYL CITRATE (PF) 100 MCG/2ML IJ SOLN
INTRAMUSCULAR | Status: DC | PRN
  Administered 2015-08-16: 50 ug via INTRAVENOUS

## 2015-08-16 MED ORDER — CHLORHEXIDINE GLUCONATE 4 % EX LIQD: 60.0000 mL | Freq: Once | CUTANEOUS | Status: DC

## 2015-08-16 MED ORDER — STERILE WATER FOR IRRIGATION IR SOLN
Status: DC | PRN
  Administered 2015-08-16: 1000 mL

## 2015-08-16 MED ORDER — DEXAMETHASONE SODIUM PHOSPHATE 10 MG/ML IJ SOLN
10.0000 mg | Freq: Once | INTRAMUSCULAR | Status: AC
  Administered 2015-08-16: 10 mg via INTRAVENOUS

## 2015-08-16 MED ORDER — ONDANSETRON HCL 4 MG/2ML IJ SOLN
INTRAMUSCULAR | Status: DC | PRN
Start: 2015-08-16 — End: 2015-08-16
  Administered 2015-08-16: 4 mg via INTRAVENOUS

## 2015-08-16 MED ORDER — MIDAZOLAM HCL 5 MG/5ML IJ SOLN
INTRAMUSCULAR | Status: DC | PRN
  Administered 2015-08-16: 2 mg via INTRAVENOUS

## 2015-08-16 MED ORDER — ONDANSETRON HCL 4 MG/2ML IJ SOLN
INTRAMUSCULAR | Status: AC
  Filled 2015-08-16: qty 2

## 2015-08-16 MED ORDER — MIDAZOLAM HCL 2 MG/2ML IJ SOLN
INTRAMUSCULAR | Status: AC
  Filled 2015-08-16: qty 4

## 2015-08-16 MED ORDER — SALINE SPRAY 0.65 % NA SOLN: 1.0000 | Freq: Every day | NASAL | Status: DC

## 2015-08-16 MED ORDER — LOSARTAN POTASSIUM 50 MG PO TABS
50.0000 mg | ORAL_TABLET | Freq: Every evening | ORAL | Status: DC
  Administered 2015-08-16: 50 mg via ORAL
  Filled 2015-08-16 (×2): qty 1

## 2015-08-16 MED ORDER — SODIUM CHLORIDE 0.9 % IR SOLN
Status: DC | PRN
  Administered 2015-08-16: 1000 mL

## 2015-08-16 MED ORDER — MENTHOL 3 MG MT LOZG: 1.0000 | LOZENGE | OROMUCOSAL | Status: DC | PRN

## 2015-08-16 MED ORDER — ZOLPIDEM TARTRATE 5 MG PO TABS
5.0000 mg | ORAL_TABLET | Freq: Every day | ORAL | Status: DC
  Administered 2015-08-16: 5 mg via ORAL
  Filled 2015-08-16: qty 1

## 2015-08-16 MED ORDER — GABAPENTIN 400 MG PO CAPS
400.0000 mg | ORAL_CAPSULE | Freq: Every day | ORAL | Status: DC
  Administered 2015-08-16: 400 mg via ORAL
  Filled 2015-08-16 (×2): qty 1

## 2015-08-16 MED ORDER — HYDROMORPHONE HCL 1 MG/ML IJ SOLN
0.5000 mg | INTRAMUSCULAR | Status: DC | PRN
  Administered 2015-08-16 (×3): 1 mg via INTRAVENOUS
  Filled 2015-08-16 (×3): qty 1

## 2015-08-16 MED ORDER — RIVAROXABAN 10 MG PO TABS
10.0000 mg | ORAL_TABLET | ORAL | Status: DC
Start: 2015-08-17 — End: 2015-08-17
  Administered 2015-08-17: 10 mg via ORAL
  Filled 2015-08-16 (×2): qty 1

## 2015-08-16 MED ORDER — DIPHENHYDRAMINE HCL 25 MG PO CAPS: 25.0000 mg | ORAL_CAPSULE | Freq: Four times a day (QID) | ORAL | Status: DC | PRN

## 2015-08-16 MED ORDER — HYDROMORPHONE HCL 1 MG/ML IJ SOLN: 0.2500 mg | INTRAMUSCULAR | Status: DC | PRN

## 2015-08-16 MED ORDER — POTASSIUM CHLORIDE CRYS ER 10 MEQ PO TBCR
10.0000 meq | EXTENDED_RELEASE_TABLET | Freq: Every morning | ORAL | Status: DC
  Administered 2015-08-17: 10 meq via ORAL
  Filled 2015-08-16: qty 1

## 2015-08-16 MED ORDER — PHENOL 1.4 % MT LIQD: 1.0000 | OROMUCOSAL | Status: DC | PRN

## 2015-08-16 MED ORDER — TRANEXAMIC ACID 1000 MG/10ML IV SOLN
1000.0000 mg | Freq: Once | INTRAVENOUS | Status: AC
  Administered 2015-08-16: 1000 mg via INTRAVENOUS
  Filled 2015-08-16: qty 10

## 2015-08-16 MED ORDER — VANCOMYCIN HCL IN DEXTROSE 1-5 GM/200ML-% IV SOLN
1000.0000 mg | Freq: Two times a day (BID) | INTRAVENOUS | Status: AC
  Administered 2015-08-16: 1000 mg via INTRAVENOUS
  Filled 2015-08-16: qty 200

## 2015-08-16 MED ORDER — PHENYLEPHRINE 40 MCG/ML (10ML) SYRINGE FOR IV PUSH (FOR BLOOD PRESSURE SUPPORT)
PREFILLED_SYRINGE | INTRAVENOUS | Status: AC
  Filled 2015-08-16: qty 10

## 2015-08-16 MED ORDER — POLYETHYLENE GLYCOL 3350 17 G PO PACK
17.0000 g | PACK | Freq: Two times a day (BID) | ORAL | Status: DC
  Administered 2015-08-16: 17 g via ORAL

## 2015-08-16 MED ORDER — BISACODYL 10 MG RE SUPP: 10.0000 mg | Freq: Every day | RECTAL | Status: DC | PRN

## 2015-08-16 MED ORDER — DOCOSANOL 10 % EX CREA: 1.0000 "application " | TOPICAL_CREAM | Freq: Every day | CUTANEOUS | Status: DC | PRN

## 2015-08-16 MED ORDER — SODIUM CHLORIDE 0.9 % IJ SOLN
INTRAMUSCULAR | Status: AC
  Filled 2015-08-16: qty 10

## 2015-08-16 MED ORDER — METHOCARBAMOL 1000 MG/10ML IJ SOLN
500.0000 mg | Freq: Four times a day (QID) | INTRAVENOUS | Status: DC | PRN
  Filled 2015-08-16: qty 5

## 2015-08-16 MED ORDER — FENTANYL CITRATE (PF) 100 MCG/2ML IJ SOLN
INTRAMUSCULAR | Status: AC
  Filled 2015-08-16: qty 4

## 2015-08-16 MED ORDER — SODIUM CHLORIDE 0.9 % IV SOLN
100.0000 mL/h | INTRAVENOUS | Status: DC
  Administered 2015-08-17: 100 mL/h via INTRAVENOUS
  Filled 2015-08-16 (×3): qty 1000

## 2015-08-16 MED ORDER — BUPIVACAINE IN DEXTROSE 0.75-8.25 % IT SOLN
INTRATHECAL | Status: DC | PRN
  Administered 2015-08-16: 2 mL via INTRATHECAL

## 2015-08-16 MED ORDER — LORATADINE 10 MG PO TABS
10.0000 mg | ORAL_TABLET | Freq: Every day | ORAL | Status: DC | PRN
  Filled 2015-08-16: qty 1

## 2015-08-16 MED ORDER — NON FORMULARY: 20.0000 mg | Freq: Every day | Status: DC

## 2015-08-16 MED ORDER — DEXAMETHASONE SODIUM PHOSPHATE 10 MG/ML IJ SOLN
10.0000 mg | Freq: Once | INTRAMUSCULAR | Status: AC
  Administered 2015-08-17: 10 mg via INTRAVENOUS
  Filled 2015-08-16: qty 1

## 2015-08-16 MED ORDER — OXYBUTYNIN CHLORIDE 5 MG PO TABS
5.0000 mg | ORAL_TABLET | Freq: Three times a day (TID) | ORAL | Status: DC | PRN
  Filled 2015-08-16: qty 1

## 2015-08-16 MED ORDER — EPHEDRINE SULFATE 50 MG/ML IJ SOLN
INTRAMUSCULAR | Status: AC
  Filled 2015-08-16: qty 1

## 2015-08-16 MED ORDER — PROPOFOL 10 MG/ML IV BOLUS
INTRAVENOUS | Status: AC
  Filled 2015-08-16: qty 20

## 2015-08-16 MED ORDER — FUROSEMIDE 40 MG PO TABS
40.0000 mg | ORAL_TABLET | Freq: Every morning | ORAL | Status: DC
  Administered 2015-08-16 – 2015-08-17 (×2): 40 mg via ORAL
  Filled 2015-08-16 (×2): qty 1

## 2015-08-16 MED ORDER — MAGNESIUM CITRATE PO SOLN: 1.0000 | Freq: Once | ORAL | Status: DC | PRN

## 2015-08-16 MED ORDER — LEVOTHYROXINE SODIUM 137 MCG PO TABS
137.0000 ug | ORAL_TABLET | Freq: Every day | ORAL | Status: DC
  Administered 2015-08-17: 137 ug via ORAL
  Filled 2015-08-16 (×2): qty 1

## 2015-08-16 MED ORDER — CELECOXIB 200 MG PO CAPS
200.0000 mg | ORAL_CAPSULE | Freq: Two times a day (BID) | ORAL | Status: DC
  Administered 2015-08-16 – 2015-08-17 (×2): 200 mg via ORAL
  Filled 2015-08-16 (×3): qty 1

## 2015-08-16 MED ORDER — VANCOMYCIN HCL IN DEXTROSE 1-5 GM/200ML-% IV SOLN
1000.0000 mg | INTRAVENOUS | Status: AC
  Administered 2015-08-16: 1000 mg via INTRAVENOUS
  Filled 2015-08-16: qty 200

## 2015-08-16 MED ORDER — METOCLOPRAMIDE HCL 10 MG PO TABS: 5.0000 mg | ORAL_TABLET | Freq: Three times a day (TID) | ORAL | Status: DC | PRN

## 2015-08-16 MED ORDER — ALUM & MAG HYDROXIDE-SIMETH 200-200-20 MG/5ML PO SUSP: 30.0000 mL | ORAL | Status: DC | PRN

## 2015-08-16 MED ORDER — DOCUSATE SODIUM 100 MG PO CAPS
100.0000 mg | ORAL_CAPSULE | Freq: Two times a day (BID) | ORAL | Status: DC
  Administered 2015-08-16 – 2015-08-17 (×2): 100 mg via ORAL

## 2015-08-16 MED ORDER — PROPOFOL 500 MG/50ML IV EMUL
INTRAVENOUS | Status: DC | PRN
  Administered 2015-08-16: 140 ug/kg/min via INTRAVENOUS

## 2015-08-16 MED ORDER — METOCLOPRAMIDE HCL 5 MG/ML IJ SOLN: 5.0000 mg | Freq: Three times a day (TID) | INTRAMUSCULAR | Status: DC | PRN

## 2015-08-16 MED ORDER — PROMETHAZINE HCL 25 MG/ML IJ SOLN: 6.2500 mg | INTRAMUSCULAR | Status: DC | PRN

## 2015-08-16 MED ORDER — ONDANSETRON HCL 4 MG/2ML IJ SOLN: 4.0000 mg | Freq: Four times a day (QID) | INTRAMUSCULAR | Status: DC | PRN

## 2015-08-16 MED ORDER — DEXAMETHASONE SODIUM PHOSPHATE 10 MG/ML IJ SOLN
INTRAMUSCULAR | Status: AC
  Filled 2015-08-16: qty 1

## 2015-08-16 MED ORDER — OMEPRAZOLE 20 MG PO CPDR
20.0000 mg | DELAYED_RELEASE_CAPSULE | Freq: Every day | ORAL | Status: DC
Start: 2015-08-17 — End: 2015-08-17
  Administered 2015-08-17: 20 mg via ORAL
  Filled 2015-08-16: qty 1

## 2015-08-16 MED ORDER — ONDANSETRON HCL 4 MG PO TABS: 4.0000 mg | ORAL_TABLET | Freq: Four times a day (QID) | ORAL | Status: DC | PRN

## 2015-08-16 MED ORDER — METHOCARBAMOL 500 MG PO TABS
500.0000 mg | ORAL_TABLET | Freq: Four times a day (QID) | ORAL | Status: DC | PRN
  Administered 2015-08-17: 500 mg via ORAL
  Filled 2015-08-16 (×3): qty 1

## 2015-08-16 MED ORDER — OXYCODONE HCL 5 MG PO TABS
10.0000 mg | ORAL_TABLET | ORAL | Status: DC
  Administered 2015-08-16 – 2015-08-17 (×4): 10 mg via ORAL
  Filled 2015-08-16 (×4): qty 4
  Filled 2015-08-16: qty 3

## 2015-08-16 SURGICAL SUPPLY — 31 items
BAG DECANTER FOR FLEXI CONT (MISCELLANEOUS) IMPLANT
BAG ZIPLOCK 12X15 (MISCELLANEOUS) IMPLANT
CAPT HIP TOTAL 2 ×3 IMPLANT
CLOTH BEACON ORANGE TIMEOUT ST (SAFETY) ×3 IMPLANT
COVER PERINEAL POST (MISCELLANEOUS) ×3 IMPLANT
DRAPE STERI IOBAN 125X83 (DRAPES) ×3 IMPLANT
DRAPE U-SHAPE 47X51 STRL (DRAPES) ×6 IMPLANT
DRSG AQUACEL AG ADV 3.5X10 (GAUZE/BANDAGES/DRESSINGS) ×3 IMPLANT
DURAPREP 26ML APPLICATOR (WOUND CARE) ×3 IMPLANT
ELECT REM PT RETURN 15FT ADLT (MISCELLANEOUS) IMPLANT
ELECT REM PT RETURN 9FT ADLT (ELECTROSURGICAL) ×3
ELECTRODE REM PT RTRN 9FT ADLT (ELECTROSURGICAL) ×1 IMPLANT
GLOVE BIOGEL PI IND STRL 7.5 (GLOVE) ×1 IMPLANT
GLOVE BIOGEL PI IND STRL 8.5 (GLOVE) ×1 IMPLANT
GLOVE BIOGEL PI INDICATOR 7.5 (GLOVE) ×2
GLOVE BIOGEL PI INDICATOR 8.5 (GLOVE) ×2
GLOVE ECLIPSE 8.0 STRL XLNG CF (GLOVE) ×6 IMPLANT
GLOVE ORTHO TXT STRL SZ7.5 (GLOVE) ×3 IMPLANT
GOWN SPEC L3 XXLG W/TWL (GOWN DISPOSABLE) ×3 IMPLANT
GOWN STRL REUS W/TWL LRG LVL3 (GOWN DISPOSABLE) ×3 IMPLANT
HOLDER FOLEY CATH W/STRAP (MISCELLANEOUS) ×3 IMPLANT
LIQUID BAND (GAUZE/BANDAGES/DRESSINGS) ×3 IMPLANT
SAW OSC TIP CART 19.5X105X1.3 (SAW) ×3 IMPLANT
SUT MNCRL AB 4-0 PS2 18 (SUTURE) ×3 IMPLANT
SUT VIC AB 1 CT1 36 (SUTURE) ×9 IMPLANT
SUT VIC AB 2-0 CT1 27 (SUTURE) ×4
SUT VIC AB 2-0 CT1 TAPERPNT 27 (SUTURE) ×2 IMPLANT
SUT VLOC 180 0 24IN GS25 (SUTURE) ×3 IMPLANT
SYR 50ML LL SCALE MARK (SYRINGE) IMPLANT
TRAY FOLEY W/METER SILVER 16FR (SET/KITS/TRAYS/PACK) ×3 IMPLANT
WATER STERILE IRR 1500ML POUR (IV SOLUTION) ×3 IMPLANT

## 2015-08-16 NOTE — Anesthesia Procedure Notes (Signed)
Spinal Patient location during procedure: OR Staffing Anesthesiologist: Franne Grip Performed by: anesthesiologist  Preanesthetic Checklist Completed: patient identified, site marked, surgical consent, pre-op evaluation, timeout performed, IV checked, risks and benefits discussed and monitors and equipment checked Spinal Block Patient position: sitting Prep: Betadine Patient monitoring: heart rate, continuous pulse ox and blood pressure Approach: midline Location: L3-4 Injection technique: single-shot Needle Needle type: Spinocan  Needle gauge: 22 G Needle length: 9 cm Additional Notes Expiration date of kit checked and confirmed. Patient tolerated procedure well, without complications. No paresthesia. CSF clear. Meaningful verbal contact maintained throughout spinal placement.

## 2015-08-16 NOTE — Progress Notes (Signed)
Utilization review completed.  

## 2015-08-16 NOTE — Anesthesia Preprocedure Evaluation (Addendum)
Anesthesia Evaluation  Patient identified by MRN, date of birth, ID band Patient awake    Reviewed: Allergy & Precautions, NPO status , Patient's Chart, lab work & pertinent test results  History of Anesthesia Complications (+) history of anesthetic complications  Airway Mallampati: II  TM Distance: >3 FB Neck ROM: Full    Dental no notable dental hx.    Pulmonary neg pulmonary ROS,    Pulmonary exam normal breath sounds clear to auscultation       Cardiovascular hypertension, Pt. on medications Normal cardiovascular exam Rhythm:Regular Rate:Normal     Neuro/Psych PSYCHIATRIC DISORDERS Anxiety Depression negative neurological ROS     GI/Hepatic Neg liver ROS, GERD  ,  Endo/Other  Hypothyroidism   Renal/GU negative Renal ROS  negative genitourinary   Musculoskeletal  (+) Arthritis ,   Abdominal   Peds negative pediatric ROS (+)  Hematology negative hematology ROS (+)   Anesthesia Other Findings   Reproductive/Obstetrics negative OB ROS                             Anesthesia Physical Anesthesia Plan  ASA: II  Anesthesia Plan: Spinal   Post-op Pain Management:    Induction: Intravenous  Airway Management Planned:   Additional Equipment:   Intra-op Plan:   Post-operative Plan: Extubation in OR  Informed Consent: I have reviewed the patients History and Physical, chart, labs and discussed the procedure including the risks, benefits and alternatives for the proposed anesthesia with the patient or authorized representative who has indicated his/her understanding and acceptance.   Dental advisory given  Plan Discussed with: CRNA  Anesthesia Plan Comments: (Discussed risks and benefits of and differences between spinal and general. Discussed risks of spinal including headache, backache, failure, bleeding and hematoma, infection, and nerve damage. Patient consents to spinal.  Questions answered. Coagulation studies and platelet count acceptable.  He does have significant scoliosis and we discussed that this may make spinal placement difficult.  Lumbar MRI 01-31-15 reviewed. He does have some spinal stenosis.  I believe the benefits of spinal in this patient exceed the risks.)      Anesthesia Quick Evaluation

## 2015-08-16 NOTE — Discharge Instructions (Addendum)
INSTRUCTIONS AFTER JOINT REPLACEMENT  ° °o Remove items at home which could result in a fall. This includes throw rugs or furniture in walking pathways °o ICE to the affected joint every three hours while awake for 30 minutes at a time, for at least the first 3-5 days, and then as needed for pain and swelling.  Continue to use ice for pain and swelling. You may notice swelling that will progress down to the foot and ankle.  This is normal after surgery.  Elevate your leg when you are not up walking on it.   °o Continue to use the breathing machine you got in the hospital (incentive spirometer) which will help keep your temperature down.  It is common for your temperature to cycle up and down following surgery, especially at night when you are not up moving around and exerting yourself.  The breathing machine keeps your lungs expanded and your temperature down. ° ° °DIET:  As you were doing prior to hospitalization, we recommend a well-balanced diet. ° °DRESSING / WOUND CARE / SHOWERING ° °Keep the surgical dressing until follow up.  The dressing is water proof, so you can shower without any extra covering.  IF THE DRESSING FALLS OFF or the wound gets wet inside, change the dressing with sterile gauze.  Please use good hand washing techniques before changing the dressing.  Do not use any lotions or creams on the incision until instructed by your surgeon.   ° °ACTIVITY ° °o Increase activity slowly as tolerated, but follow the weight bearing instructions below.   °o No driving for 6 weeks or until further direction given by your physician.  You cannot drive while taking narcotics.  °o No lifting or carrying greater than 10 lbs. until further directed by your surgeon. °o Avoid periods of inactivity such as sitting longer than an hour when not asleep. This helps prevent blood clots.  °o You may return to work once you are authorized by your doctor.  ° ° ° °WEIGHT BEARING  ° °Weight bearing as tolerated with assist  device (walker, cane, etc) as directed, use it as long as suggested by your surgeon or therapist, typically at least 4-6 weeks. ° ° °EXERCISES ° °Results after joint replacement surgery are often greatly improved when you follow the exercise, range of motion and muscle strengthening exercises prescribed by your doctor. Safety measures are also important to protect the joint from further injury. Any time any of these exercises cause you to have increased pain or swelling, decrease what you are doing until you are comfortable again and then slowly increase them. If you have problems or questions, call your caregiver or physical therapist for advice.  ° °Rehabilitation is important following a joint replacement. After just a few days of immobilization, the muscles of the leg can become weakened and shrink (atrophy).  These exercises are designed to build up the tone and strength of the thigh and leg muscles and to improve motion. Often times heat used for twenty to thirty minutes before working out will loosen up your tissues and help with improving the range of motion but do not use heat for the first two weeks following surgery (sometimes heat can increase post-operative swelling).  ° °These exercises can be done on a training (exercise) mat, on the floor, on a table or on a bed. Use whatever works the best and is most comfortable for you.    Use music or television while you are exercising so that   the exercises are a pleasant break in your day. This will make your life better with the exercises acting as a break in your routine that you can look forward to.   Perform all exercises about fifteen times, three times per day or as directed.  You should exercise both the operative leg and the other leg as well. ° °Exercises include: °  °• Quad Sets - Tighten up the muscle on the front of the thigh (Quad) and hold for 5-10 seconds.   °• Straight Leg Raises - With your knee straight (if you were given a brace, keep it on),  lift the leg to 60 degrees, hold for 3 seconds, and slowly lower the leg.  Perform this exercise against resistance later as your leg gets stronger.  °• Leg Slides: Lying on your back, slowly slide your foot toward your buttocks, bending your knee up off the floor (only go as far as is comfortable). Then slowly slide your foot back down until your leg is flat on the floor again.  °• Angel Wings: Lying on your back spread your legs to the side as far apart as you can without causing discomfort.  °• Hamstring Strength:  Lying on your back, push your heel against the floor with your leg straight by tightening up the muscles of your buttocks.  Repeat, but this time bend your knee to a comfortable angle, and push your heel against the floor.  You may put a pillow under the heel to make it more comfortable if necessary.  ° °A rehabilitation program following joint replacement surgery can speed recovery and prevent re-injury in the future due to weakened muscles. Contact your doctor or a physical therapist for more information on knee rehabilitation.  ° ° °CONSTIPATION ° °Constipation is defined medically as fewer than three stools per week and severe constipation as less than one stool per week.  Even if you have a regular bowel pattern at home, your normal regimen is likely to be disrupted due to multiple reasons following surgery.  Combination of anesthesia, postoperative narcotics, change in appetite and fluid intake all can affect your bowels.  ° °YOU MUST use at least one of the following options; they are listed in order of increasing strength to get the job done.  They are all available over the counter, and you may need to use some, POSSIBLY even all of these options:   ° °Drink plenty of fluids (prune juice may be helpful) and high fiber foods °Colace 100 mg by mouth twice a day  °Senokot for constipation as directed and as needed Dulcolax (bisacodyl), take with full glass of water  °Miralax (polyethylene glycol)  once or twice a day as needed. ° °If you have tried all these things and are unable to have a bowel movement in the first 3-4 days after surgery call either your surgeon or your primary doctor.   ° °If you experience loose stools or diarrhea, hold the medications until you stool forms back up.  If your symptoms do not get better within 1 week or if they get worse, check with your doctor.  If you experience "the worst abdominal pain ever" or develop nausea or vomiting, please contact the office immediately for further recommendations for treatment. ° ° °ITCHING:  If you experience itching with your medications, try taking only a single pain pill, or even half a pain pill at a time.  You can also use Benadryl over the counter for itching or also to   help with sleep.   TED HOSE STOCKINGS:  Use stockings on both legs until for at least 2 weeks or as directed by physician office. They may be removed at night for sleeping.  MEDICATIONS:  See your medication summary on the After Visit Summary that nursing will review with you.  You may have some home medications which will be placed on hold until you complete the course of blood thinner medication.  It is important for you to complete the blood thinner medication as prescribed.  PRECAUTIONS:  If you experience chest pain or shortness of breath - call 911 immediately for transfer to the hospital emergency department.   If you develop a fever greater that 101 F, purulent drainage from wound, increased redness or drainage from wound, foul odor from the wound/dressing, or calf pain - CONTACT YOUR SURGEON.                                                   FOLLOW-UP APPOINTMENTS:  If you do not already have a post-op appointment, please call the office for an appointment to be seen by your surgeon.  Guidelines for how soon to be seen are listed in your After Visit Summary, but are typically between 1-4 weeks after surgery.  OTHER INSTRUCTIONS:   Knee  Replacement:  Do not place pillow under knee, focus on keeping the knee straight while resting.   MAKE SURE YOU:   Understand these instructions.   Get help right away if you are not doing well or get worse.    Thank you for letting us be a part of your medical care team.  It is a privilege we respect greatly.  We hope these instructions will help you stay on track for a fast and full recovery!     Information on my medicine - XARELTO (Rivaroxaban) This medication education was reviewed with me or my healthcare representative as part of my discharge preparation.  The pharmacist that spoke with me during my hospital stay was:  Altha Harm  Why was Xarelto prescribed for you? Xarelto was prescribed for you to reduce the risk of blood clots forming after orthopedic surgery. The medical term for these abnormal blood clots is venous thromboembolism (VTE).  What do you need to know about xarelto ? Take your Xarelto ONCE DAILY at the same time every day. You may take it either with or without food.  If you have difficulty swallowing the tablet whole, you may crush it and mix in applesauce just prior to taking your dose.  Take Xarelto exactly as prescribed by your doctor and DO NOT stop taking Xarelto without talking to the doctor who prescribed the medication.  Stopping without other VTE prevention medication to take the place of Xarelto may increase your risk of developing a clot.  After discharge, you should have regular check-up appointments with your healthcare provider that is prescribing your Xarelto.    What do you do if you miss a dose? If you miss a dose, take it as soon as you remember on the same day then continue your regularly scheduled once daily regimen the next day. Do not take two doses of Xarelto on the same day.   Important Safety Information A possible side effect of Xarelto is bleeding. You should call your healthcare provider right away if you experience any of  the following: ? Bleeding from an injury or your nose that does not stop. ? Unusual colored urine (red or dark brown) or unusual colored stools (red or black). ? Unusual bruising for unknown reasons. ? A serious fall or if you hit your head (even if there is no bleeding).  Some medicines may interact with Xarelto and might increase your risk of bleeding while on Xarelto. To help avoid this, consult your healthcare provider or pharmacist prior to using any new prescription or non-prescription medications, including herbals, vitamins, non-steroidal anti-inflammatory drugs (NSAIDs) and supplements.  This website has more information on Xarelto: [email or web address]

## 2015-08-16 NOTE — Interval H&P Note (Signed)
History and Physical Interval Note:  08/16/2015 6:51 AM  Ronald Lowery  has presented today for surgery, with the diagnosis of left hip osteoarthritis  The various methods of treatment have been discussed with the patient and family. After consideration of risks, benefits and other options for treatment, the patient has consented to  Procedure(s): LEFT TOTAL HIP ARTHROPLASTY ANTERIOR APPROACH (Left) as a surgical intervention .  The patient's history has been reviewed, patient examined, no change in status, stable for surgery.  I have reviewed the patient's chart and labs.  Questions were answered to the patient's satisfaction.     Mauri Pole

## 2015-08-16 NOTE — Evaluation (Signed)
Physical Therapy Evaluation Patient Details Name: Ronald Lowery MRN: [id number] DOB: 1941/07/14 Today's Date: 08/16/2015   History of Present Illness  74 yo male s/p L THA 08/16/15.   Clinical Impression  On eval, POD 0, pt required Min assist for mobility-walked ~40 feet with RW. Pt tolerated activity well.     Follow Up Recommendations Home health PT;Supervision/Assistance - 24 hour    Equipment Recommendations  Rolling walker with 5" wheels    Recommendations for Other Services       Precautions / Restrictions Precautions Precautions: Fall Restrictions Weight Bearing Restrictions: No LLE Weight Bearing: Weight bearing as tolerated      Mobility  Bed Mobility Overal bed mobility: Needs Assistance Bed Mobility: Supine to Sit     Supine to sit: Min assist     General bed mobility comments: Assist for L LE  Transfers Overall transfer level: Needs assistance Equipment used: Rolling walker (2 wheeled) Transfers: Sit to/from Stand Sit to Stand: Min assist;From elevated surface         General transfer comment: Assist to rise, stabilize, control descent. VCs safety, technique, hand/LE placement  Ambulation/Gait Ambulation/Gait assistance: Min assist Ambulation Distance (Feet): 40 Feet Assistive device: Rolling walker (2 wheeled) Gait Pattern/deviations: Trunk flexed;Step-to pattern     General Gait Details: Assist to stabilize. VCs safety, sequence.   Stairs            Wheelchair Mobility    Modified Rankin (Stroke Patients Only)       Balance                                             Pertinent Vitals/Pain Pain Assessment: 0-10 Pain Score: 3  Pain Location: L hip/thigh Pain Descriptors / Indicators: Sore Pain Intervention(s): Monitored during session;Repositioned;Ice applied    Home Living Family/patient expects to be discharged to:: Private residence Living Arrangements: Spouse/significant other Available Help at  Discharge: Family   Home Access: Stairs to enter Entrance Stairs-Rails: Right Entrance Stairs-Number of Steps: 6   Home Equipment: Bedside commode      Prior Function                 Hand Dominance        Extremity/Trunk Assessment   Upper Extremity Assessment: Defer to OT evaluation           Lower Extremity Assessment: Generalized weakness      Cervical / Trunk Assessment: Kyphotic (scoliosis)  Communication   Communication: No difficulties  Cognition Arousal/Alertness: Awake/alert Behavior During Therapy: WFL for tasks assessed/performed Overall Cognitive Status: Within Functional Limits for tasks assessed                      General Comments      Exercises        Assessment/Plan    PT Assessment Patient needs continued PT services  PT Diagnosis Difficulty walking;Acute pain   PT Problem List Decreased strength;Decreased range of motion;Decreased activity tolerance;Decreased balance;Decreased mobility;Decreased knowledge of use of DME;Pain  PT Treatment Interventions DME instruction;Gait training;Stair training;Functional mobility training;Therapeutic activities;Patient/family education;Balance training;Therapeutic exercise   PT Goals (Current goals can be found in the Care Plan section) Acute Rehab PT Goals Patient Stated Goal: home. walk with less pain. PT Goal Formulation: With patient/family Time For Goal Achievement: 08/23/15 Potential to Achieve Goals: Good    Frequency 7X/week  Barriers to discharge        Co-evaluation               End of Session Equipment Utilized During Treatment: Gait belt Activity Tolerance: Patient tolerated treatment well Patient left: in chair;with call bell/phone within reach;with chair alarm set;with family/visitor present           Time: 4562-5638 PT Time Calculation (min) (ACUTE ONLY): 19 min   Charges:   PT Evaluation $Initial PT Evaluation Tier I: 1 Procedure     PT G  Codes:        Weston Anna, MPT Pager: [phone]

## 2015-08-16 NOTE — Transfer of Care (Signed)
Immediate Anesthesia Transfer of Care Note  Patient: Ronald Lowery  Procedure(s) Performed: Procedure(s): LEFT TOTAL HIP ARTHROPLASTY ANTERIOR APPROACH (Left)  Patient Location: PACU  Anesthesia Type:MAC and Spinal  Level of Consciousness: awake, alert , oriented and patient cooperative  Airway & Oxygen Therapy: Patient Spontanous Breathing and Patient connected to face mask oxygen  Post-op Assessment: Report given to RN and Post -op Vital signs reviewed and stable  Post vital signs: Reviewed and stable  Last Vitals:  Filed Vitals:   08/16/15 0531  BP: 122/64  Pulse: 61  Temp: 36.9 C  Resp: 16    Complications: No apparent anesthesia complications

## 2015-08-16 NOTE — Op Note (Signed)
NAME:  Ronald Lowery                ACCOUNT NO.: [id number]      MEDICAL RECORD NO.: [id number]      FACILITY:  [facility]      PHYSICIAN:  Paralee Cancel D  DATE OF BIRTH:  07-Dec-1940     DATE OF PROCEDURE:  08/16/2015                                 OPERATIVE REPORT         PREOPERATIVE DIAGNOSIS: Left  hip osteoarthritis.      POSTOPERATIVE DIAGNOSIS:  Left hip osteoarthritis.      PROCEDURE:  Left total hip replacement through an anterior approach   utilizing DePuy THR system, component size 63mm pinnacle cup, a size 36+4 neutral   Altrex liner, a size 9 Hi Tri Lock stem with a 36+1.5 delta ceramic   ball.      SURGEON:  Pietro Cassis. Alvan Dame, M.D.      ASSISTANT:  Nehemiah Massed, PA-C     ANESTHESIA:  Spinal.      SPECIMENS:  None.      COMPLICATIONS:  None.      BLOOD LOSS:  700 cc     DRAINS:  None.      INDICATION OF THE PROCEDURE:  Ronald Lowery is a 74 y.o. male who had   presented to office for evaluation of left hip pain.  Radiographs revealed   progressive degenerative changes with bone-on-bone   articulation to the  hip joint.  The patient had painful limited range of   motion significantly affecting their overall quality of life.  The patient was failing to    respond to conservative measures, and at this point was ready   to proceed with more definitive measures.  The patient has noted progressive   degenerative changes in his hip, progressive problems and dysfunction   with regarding the hip prior to surgery.  Consent was obtained for   benefit of pain relief.  Specific risk of infection, DVT, component   failure, dislocation, need for revision surgery, as well discussion of   the anterior versus posterior approach were reviewed.  Consent was   obtained for benefit of anterior pain relief through an anterior   approach.      PROCEDURE IN DETAIL:  The patient was brought to operative theater.   Once adequate anesthesia, preoperative  antibiotics, 1gm of Vancomycin, 1 gm of Tranexamic Acid, and 10 mg of Decadron administered.   The patient was positioned supine on the OSI Hanna table.  Once adequate   padding of boney process was carried out, we had predraped out the hip, and  used fluoroscopy to confirm orientation of the pelvis and position.      The left hip was then prepped and draped from proximal iliac crest to   mid thigh with shower curtain technique.      Time-out was performed identifying the patient, planned procedure, and   extremity.     An incision was then made 2 cm distal and lateral to the   anterior superior iliac spine extending over the orientation of the   tensor fascia lata muscle and sharp dissection was carried down to the   fascia of the muscle and protractor placed in the soft tissues.      The fascia was  then incised.  The muscle belly was identified and swept   laterally and retractor placed along the superior neck.  Following   cauterization of the circumflex vessels and removing some pericapsular   fat, a second cobra retractor was placed on the inferior neck.  A third   retractor was placed on the anterior acetabulum after elevating the   anterior rectus.  A L-capsulotomy was along the line of the   superior neck to the trochanteric fossa, then extended proximally and   distally.  Tag sutures were placed and the retractors were then placed   intracapsular.  We then identified the trochanteric fossa and   orientation of my neck cut, confirmed this radiographically   and then made a neck osteotomy with the femur on traction.  The femoral   head was removed without difficulty or complication.  Traction was let   off and retractors were placed posterior and anterior around the   acetabulum.      The labrum and foveal tissue were debrided.  I began reaming with a 45mm   reamer and reamed up to 61mm reamer with good bony bed preparation and a 22mm   cup was chosen.  The final 51mm Pinnacle  cup was then impacted under fluoroscopy  to confirm the depth of penetration and orientation with respect to   abduction.  A screw was placed followed by the hole eliminator.  The final   36+4 neutral Altrex liner was impacted with good visualized rim fit.  The cup was positioned anatomically within the acetabular portion of the pelvis.      At this point, the femur was rolled at 80 degrees.  Further capsule was   released off the inferior aspect of the femoral neck.  I then   released the superior capsule proximally.  The hook was placed laterally   along the femur and elevated manually and held in position with the bed   hook.  The leg was then extended and adducted with the leg rolled to 100   degrees of external rotation.  Once the proximal femur was fully   exposed, I used a box osteotome to set orientation.  I then began   broaching with the starting chili pepper broach and passed this by hand and then broached up to 9.  With the 9 broach in place I chose a high offset neck and did a trial reduction.  The offset was appropriate, leg lengths   appeared to be equal, confirmed radiographically.   Given these findings, I went ahead and dislocated the hip, repositioned all   retractors and positioned the right hip in the extended and abducted position.  The final 9 hi Tri Lock stem was   chosen and it was impacted down to the level of neck cut.  Based on this   and the trial reduction, a 36+1.5 delta ceramic ball was chosen and   impacted onto a clean and dry trunnion, and the hip was reduced.  The   hip had been irrigated throughout the case again at this point.  I did   reapproximate the superior capsular leaflet to the anterior leaflet   using #1 Vicryl.  The fascia of the   tensor fascia lata muscle was then reapproximated using #1 Vicryl and #0 V-lock sutures.  The   remaining wound was closed with 2-0 Vicryl and running 4-0 Monocryl.   The hip was cleaned, dried, and dressed sterilely  using Dermabond and  Aquacel dressing.  He was then brought   to recovery room in stable condition tolerating the procedure well.    Nehemiah Massed, PA-C was present for the entirety of the case involved from   preoperative positioning, perioperative retractor management, general   facilitation of the case, as well as primary wound closure as assistant.            Pietro Cassis Alvan Dame, M.D.        08/16/2015 10:18 AM

## 2015-08-17 LAB — CBC
HEMATOCRIT: 29.5 % — AB (ref 39.0–52.0)
HEMOGLOBIN: 9.8 g/dL — AB (ref 13.0–17.0)
MCH: 30.2 pg (ref 26.0–34.0)
MCHC: 33.2 g/dL (ref 30.0–36.0)
MCV: 91 fL (ref 78.0–100.0)
PLATELETS: 212 10*3/uL (ref 150–400)
RBC: 3.24 MIL/uL — AB (ref 4.22–5.81)
RDW: 12.8 % (ref 11.5–15.5)
WBC: 12.8 10*3/uL — AB (ref 4.0–10.5)

## 2015-08-17 LAB — BASIC METABOLIC PANEL
ANION GAP: 6 (ref 5–15)
BUN: 10 mg/dL (ref 6–20)
CALCIUM: 8.5 mg/dL — AB (ref 8.9–10.3)
CO2: 30 mmol/L (ref 22–32)
CREATININE: 0.84 mg/dL (ref 0.61–1.24)
Chloride: 96 mmol/L — ABNORMAL LOW (ref 101–111)
GFR calc Af Amer: >60 mL/min (ref >60)
GLUCOSE: 115 mg/dL — AB (ref 65–99)
Potassium: 4 mmol/L (ref 3.5–5.1)
Sodium: 132 mmol/L — ABNORMAL LOW (ref 135–145)

## 2015-08-17 MED ORDER — FERROUS SULFATE 325 (65 FE) MG PO TABS: 325.0000 mg | ORAL_TABLET | Freq: Three times a day (TID) | ORAL | Status: DC

## 2015-08-17 MED ORDER — DOCUSATE SODIUM 100 MG PO CAPS: 100.0000 mg | ORAL_CAPSULE | Freq: Two times a day (BID) | ORAL | Status: DC

## 2015-08-17 MED ORDER — TIZANIDINE HCL 4 MG PO TABS: 4.0000 mg | ORAL_TABLET | Freq: Four times a day (QID) | ORAL | Status: DC | PRN

## 2015-08-17 MED ORDER — OXYCODONE HCL 10 MG PO TABS: 10.0000 mg | ORAL_TABLET | ORAL | Status: DC | PRN

## 2015-08-17 MED ORDER — POLYETHYLENE GLYCOL 3350 17 G PO PACK: 17.0000 g | PACK | Freq: Two times a day (BID) | ORAL | Status: DC

## 2015-08-17 MED ORDER — RIVAROXABAN 10 MG PO TABS: 10.0000 mg | ORAL_TABLET | ORAL | Status: DC

## 2015-08-17 NOTE — Evaluation (Signed)
Occupational Therapy Evaluation Patient Details Name: Ronald Lowery MRN: [id number] DOB: September 16, 1941 Today's Date: 08/17/2015    History of Present Illness 74 yo male s/p L THA 08/16/15.    Clinical Impression   Pt was admitted for the above.  All education was completed.  No further OT is needed at this time.    Follow Up Recommendations  Supervision/Assistance - 24 hour    Equipment Recommendations  None recommended by OT    Recommendations for Other Services       Precautions / Restrictions Precautions Precautions: Fall Restrictions Weight Bearing Restrictions: No LLE Weight Bearing: Weight bearing as tolerated      Mobility Bed Mobility               General bed mobility comments: oob  Transfers   Equipment used: Rolling walker (2 wheeled) Transfers: Sit to/from Stand Sit to Stand: Min guard         General transfer comment: for safety    Balance                                            ADL Overall ADL's : Needs assistance/impaired                         Toilet Transfer: Min guard;Ambulation       Tub/ Shower Transfer: Walk-in shower;Min guard;Ambulation     General ADL Comments: pt's wife will assist with adls as needed.  Practiced shower transfer including taking walker into stall to access seat (simulated this part due to small shower).  educated on sidestepping out of Tour manager     Praxis      Pertinent Vitals/Pain Pain Assessment: 0-10 Pain Score: 2  Pain Location: L knee Pain Descriptors / Indicators: Aching Pain Intervention(s): Limited activity within patient's tolerance;Monitored during session;Premedicated before session;Repositioned     Hand Dominance     Extremity/Trunk Assessment Upper Extremity Assessment Upper Extremity Assessment: Overall WFL for tasks assessed           Communication Communication Communication: No difficulties   Cognition  Arousal/Alertness: Awake/alert Behavior During Therapy: WFL for tasks assessed/performed Overall Cognitive Status: Within Functional Limits for tasks assessed                     General Comments       Exercises       Shoulder Instructions      Home Living Family/patient expects to be discharged to:: Private residence Living Arrangements: Spouse/significant other                 Bathroom Shower/Tub: Occupational psychologist: Standard     Home Equipment: Shower seat;Bedside commode          Prior Functioning/Environment Level of Independence: Independent             OT Diagnosis: Generalized weakness   OT Problem List:     OT Treatment/Interventions:      OT Goals(Current goals can be found in the care plan section) Acute Rehab OT Goals Patient Stated Goal: home. walk with less pain.  OT Frequency:     Barriers to D/C:            Co-evaluation  End of Session    Activity Tolerance: Patient tolerated treatment well Patient left: in chair;with call bell/phone within reach;with family/visitor present   Time: 8315-1761 OT Time Calculation (min): 16 min Charges:  OT General Charges $OT Visit: 1 Procedure OT Evaluation $Initial OT Evaluation Tier I: 1 Procedure G-Codes:    Nahmir Zeidman 2015/09/07, 10:32 AM Lesle Chris, OTR/L [phone] 09/07/15

## 2015-08-17 NOTE — Care Management Note (Signed)
Case Management Note  Patient Details  Name: ZAKIAH BECKERMAN MRN: [id number] Date of Birth: June 24, 1941  Subjective/Objective:                   LEFT TOTAL HIP ARTHROPLASTY ANTERIOR APPROACH (Left)  Action/Plan: Discharge planning Expected Discharge Date:   08/17/15            Expected Discharge Plan:  Home/Self Care  In-House Referral:     Discharge planning Services  CM Consult  Post Acute Care Choice:    Choice offered to:     DME Arranged:    DME Agency:     HH Arranged:    [place] Agency:     Status of Service:  Completed, signed off  Medicare Important Message Given:    Date Medicare IM Given:    Medicare IM give by:    Date Additional Medicare IM Given:    Additional Medicare Important Message give by:     If discussed at [place] of Stay Meetings, dates discussed:    Additional Comments: CM met notes NO HHPT ordered as presurgical arrangement from [place], CM states NO HHC.  CM called [facility] DME rep, Lecretia to please deliver the rolling walker to room so pt can discharge.  No other CM needs were communicated.  Dellie Catholic, RN 08/17/2015, 12:47 PM

## 2015-08-17 NOTE — Anesthesia Postprocedure Evaluation (Signed)
  Anesthesia Post-op Note  Patient: Ronald Lowery  Procedure(s) Performed: Procedure(s) (LRB): LEFT TOTAL HIP ARTHROPLASTY ANTERIOR APPROACH (Left)  Patient Location: PACU  Anesthesia Type: Spinal  Level of Consciousness: awake and alert   Airway and Oxygen Therapy: Patient Spontanous Breathing  Post-op Pain: mild  Post-op Assessment: Post-op Vital signs reviewed, Patient's Cardiovascular Status Stable, Respiratory Function Stable, Patent Airway and No signs of Nausea or vomiting. Spinal level receding normally in PACU.  Last Vitals:  Filed Vitals:   08/16/15 2124  BP: 125/56  Pulse: 86  Temp: 37.2 C  Resp: 15    Post-op Vital Signs: stable   Complications: No apparent anesthesia complications

## 2015-08-17 NOTE — Progress Notes (Signed)
     Subjective: 1 Day Post-Op Procedure(s) (LRB): LEFT TOTAL HIP ARTHROPLASTY ANTERIOR APPROACH (Left)   Patient reports pain as mild, pain controlled. Reportedly he keeps trying to get up on himself. No other events throughout the night.  We have reviewed safety.  Ready to be discharged home.  Objective:   VITALS:   Filed Vitals:   08/17/15 0630  BP: 108/55  Pulse: 72  Temp: 97.4 F (36.3 C)  Resp: 15    Dorsiflexion/Plantar flexion intact Incision: dressing C/D/I No cellulitis present Compartment soft  LABS  Recent Labs  08/17/15 0415  HGB 9.8*  HCT 29.5*  WBC 12.8*  PLT 212     Recent Labs  08/17/15 0415  NA 132*  K 4.0  BUN 10  CREATININE 0.84  GLUCOSE 115*     Assessment/Plan: 1 Day Post-Op Procedure(s) (LRB): LEFT TOTAL HIP ARTHROPLASTY ANTERIOR APPROACH (Left) Foley cath d/c'ed Advance diet Up with therapy D/C IV fluids Discharge home with home health  Follow up in 2 weeks at [facility]. Follow up with OLIN,Wilmer Berryhill D in 2 weeks.  Contact information:  [facility] [address], [address] [place] Vertis Bauder   PAC  08/17/2015, 9:17 AM

## 2015-08-17 NOTE — Progress Notes (Signed)
Physical Therapy Treatment Patient Details Name: Ronald Lowery MRN: [id number] DOB: [birth year] Today's Date: 08/17/2015    History of Present Illness 74 yo male s/p L THA 08/16/15.     PT Comments    Progressing well with session. All education completed. Issued HEP since nurse did not see HHPT ordered by MD. Instructed pt to perform exercises 2-3x/day.   Follow Up Recommendations  Home health PT;Supervision/Assistance - 24 hour     Equipment Recommendations  Rolling walker with 5" wheels    Recommendations for Other Services       Precautions / Restrictions Precautions Precautions: Fall Restrictions Weight Bearing Restrictions: No LLE Weight Bearing: Weight bearing as tolerated    Mobility  Bed Mobility               General bed mobility comments: oob  Transfers Overall transfer level: Needs assistance Equipment used: Rolling walker (2 wheeled) Transfers: Sit to/from Stand Sit to Stand: Min guard         General transfer comment: for safety  Ambulation/Gait Ambulation/Gait assistance: Min guard Ambulation Distance (Feet): 150 Feet Assistive device: Rolling walker (2 wheeled) Gait Pattern/deviations: Step-to pattern;Trunk flexed     General Gait Details: close guard for safety. VCs safety, sequence   Stairs Stairs: Yes Stairs assistance: Min assist Stair Management: Step to pattern;Forwards;One rail Right Number of Stairs: 4 General stair comments: VCs safety, sequence, technique. small amount of assist to steady intermittently.   Wheelchair Mobility    Modified Rankin (Stroke Patients Only)       Balance                                    Cognition Arousal/Alertness: Awake/alert Behavior During Therapy: WFL for tasks assessed/performed Overall Cognitive Status: Within Functional Limits for tasks assessed                      Exercises Total Joint Exercises Ankle Circles/Pumps: AROM;Both;15 reps;Supine Quad  Sets: AROM;Both;15 reps;Supine Heel Slides: AAROM;AROM;15 reps;Supine Hip ABduction/ADduction: AROM;AAROM;15 reps;Supine    General Comments        Pertinent Vitals/Pain Pain Assessment: 0-10 Pain Score: 2  Pain Location: L thigh/hip area Pain Descriptors / Indicators: Sore Pain Intervention(s): Monitored during session;Ice applied;Repositioned    Home Living Family/patient expects to be discharged to:: Private residence Living Arrangements: Spouse/significant other           Home Equipment: Shower seat;Bedside commode      Prior Function Level of Independence: Independent          PT Goals (current goals can now be found in the care plan section) Acute Rehab PT Goals Patient Stated Goal: home. walk with less pain. Progress towards PT goals: Progressing toward goals    Frequency  7X/week    PT Plan Current plan remains appropriate    Co-evaluation             End of Session Equipment Utilized During Treatment: Gait belt Activity Tolerance: Patient tolerated treatment well Patient left: in chair;with call bell/phone within reach;with family/visitor present     Time: 4098-1191 PT Time Calculation (min) (ACUTE ONLY): 44 min  Charges:  $Gait Training: 23-37 mins $Therapeutic Exercise: 23-37 mins                    G Codes:      Weston Anna, MPT Pager: [phone]

## 2015-08-22 NOTE — Discharge Summary (Signed)
Physician Discharge Summary  Patient ID: Ronald Lowery MRN: [id number] DOB/AGE: [birth year] 74 y.o.  Admit date: 08/16/2015 Discharge date: 08/17/2015   Procedures:  Procedure(s) (LRB): LEFT TOTAL HIP ARTHROPLASTY ANTERIOR APPROACH (Left)  Attending Physician:  Dr. Paralee Cancel   Admission Diagnoses:   Left hip primary OA / pain  Discharge Diagnoses:  Principal Problem:   S/P left THA, AA  Past Medical History  Diagnosis Date  . Environmental allergies   . Hypothyroidism   . Hypertension   . Scoliosis   . Urgency of urination   . Cataract     bilateral-may be removed 05/2015  . GERD (gastroesophageal reflux disease)   . DDD (degenerative disc disease), lumbar     L5-steroid injection 01/2015  . Arthritis   . Complication of anesthesia     "took a while to wake up with triple hernia repair"  . History of skin cancer   . Nocturia   . History of bladder cancer   . History of prostate cancer   . Basal cell carcinoma 2004    x1-face  . Squamous carcinoma ([place]) 2008    x2- neck, face  . Anxiety   . Depression     HPI:    Ronald Lowery, 74 y.o. male, has a history of pain and functional disability in the left hip(s) due to arthritis and patient has failed non-surgical conservative treatments for greater than 12 weeks to include NSAID's and/or analgesics, use of assistive devices and activity modification. Onset of symptoms was gradual starting 1+ years ago with gradually worsening course since that time.The patient noted no past surgery on the left hip(s). Patient currently rates pain in the left hip at 7 out of 10 with activity. Patient has night pain, worsening of pain with activity and weight bearing, trendelenberg gait, pain that interfers with activities of daily living and pain with passive range of motion. Patient has evidence of periarticular osteophytes and joint space narrowing by imaging studies. This condition presents safety issues increasing the risk of  falls. There is no current active infection. Risks, benefits and expectations were discussed with the patient. Risks including but not limited to the risk of anesthesia, blood clots, nerve damage, blood vessel damage, failure of the prosthesis, infection and up to and including death. Patient understand the risks, benefits and expectations and wishes to proceed with surgery.   PCP: Geoffery Lyons, MD   Discharged Condition: good  Hospital Course:  Patient underwent the above stated procedure on 08/16/2015. Patient tolerated the procedure well and brought to the recovery room in good condition and subsequently to the floor.  POD #1 BP: 108/55 ; Pulse: 72 ; Temp: 97.4 F (36.3 C) ; Resp: 15 Patient reports pain as mild, pain controlled. Reportedly he keeps trying to get up on himself. No other events throughout the night. We have reviewed safety. Ready to be discharged home. Dorsiflexion/plantar flexion intact, incision: dressing C/D/I, no cellulitis present and compartment soft.   LABS  Basename    HGB  9.8  HCT  29.5    Discharge Exam: General appearance: alert, cooperative and no distress Extremities: Homans sign is negative, no sign of DVT, no edema, redness or tenderness in the calves or thighs and no ulcers, gangrene or trophic changes  Disposition: Home with follow up in 2 weeks   Follow-up Information    Follow up with Mauri Pole, MD. Schedule an appointment as soon as possible for a visit in 2 weeks.  Specialty:  Orthopedic Surgery   Contact information:   [address] [place] [address] [phone]       Follow up with [facility].   Why:  rolling walker   Contact information:   [address] [place] [address] [phone]       Discharge Instructions    Call MD / Call 911    Complete by:  As directed   If you experience chest pain or shortness of breath, CALL 911 and be transported to the hospital  emergency room.  If you develope a fever above 101 F, pus (white drainage) or increased drainage or redness at the wound, or calf pain, call your surgeon's office.     Change dressing    Complete by:  As directed   Maintain surgical dressing until follow up in the clinic. If the edges start to pull up, may reinforce with tape. If the dressing is no longer working, may remove and cover with gauze and tape, but must keep the area dry and clean.  Call with any questions or concerns.     Constipation Prevention    Complete by:  As directed   Drink plenty of fluids.  Prune juice may be helpful.  You may use a stool softener, such as Colace (over the counter) 100 mg twice a day.  Use MiraLax (over the counter) for constipation as needed.     Diet - low sodium heart healthy    Complete by:  As directed      Discharge instructions    Complete by:  As directed   Maintain surgical dressing until follow up in the clinic. If the edges start to pull up, may reinforce with tape. If the dressing is no longer working, may remove and cover with gauze and tape, but must keep the area dry and clean.  Follow up in 2 weeks at [facility]. Call with any questions or concerns.     Increase activity slowly as tolerated    Complete by:  As directed   Weight bearing as tolerated with assist device (walker, cane, etc) as directed, use it as long as suggested by your surgeon or therapist, typically at least 4-6 weeks.     TED hose    Complete by:  As directed   Use stockings (TED hose) for 2 weeks on both leg(s).  You may remove them at night for sleeping.             Medication List    STOP taking these medications        oxyCODONE-acetaminophen 5-325 MG tablet  Commonly known as:  ROXICET     phenazopyridine 100 MG tablet  Commonly known as:  PYRIDIUM      TAKE these medications        ABREVA 10 % Crea  Generic drug:  Docosanol  Apply 1 application topically daily as needed (for fever  blisters). Fever blisters     acetaminophen 650 MG CR tablet  Commonly known as:  TYLENOL  Take 650 mg by mouth every 8 (eight) hours as needed for pain.     AYR SALINE NASAL GEL Swab  Place 1 each into the nose 2 (two) times daily as needed (nasal dryness).     docusate sodium 100 MG capsule  Commonly known as:  COLACE  Take 1 capsule (100 mg total) by mouth 2 (two) times daily.     ferrous sulfate 325 (  65 FE) MG tablet  Take 1 tablet (325 mg total) by mouth 3 (three) times daily after meals.     furosemide 40 MG tablet  Commonly known as:  LASIX  Take 40 mg by mouth every morning.     gabapentin 300 MG capsule  Commonly known as:  NEURONTIN  Take 300 mg by mouth at bedtime. Takes with 100 mg to equal 400 mg nightly.     gabapentin 100 MG capsule  Commonly known as:  NEURONTIN  Take 100 mg by mouth at bedtime. Takes with 300 mg capsule to equal 400 mg nightly.     guaiFENesin 600 MG 12 hr tablet  Commonly known as:  MUCINEX  Take 600 mg by mouth 2 (two) times daily as needed for cough.     levothyroxine 137 MCG tablet  Commonly known as:  SYNTHROID, LEVOTHROID  Take 137 mcg by mouth every morning.     loratadine 10 MG tablet  Commonly known as:  CLARITIN  Take 10 mg by mouth daily as needed for allergies.     LOSARTAN POTASSIUM PO  Take 50 mg by mouth every evening.     MUSCLE RUB 10-15 % Crea  Apply 1 application topically as needed for muscle pain.     omeprazole 20 MG capsule  Commonly known as:  PRILOSEC  Take 20 mg by mouth every morning. 1 hour before breakfast.     oxybutynin 5 MG tablet  Commonly known as:  DITROPAN  Take 1 tablet (5 mg total) by mouth every 8 (eight) hours as needed for bladder spasms (or frequent urination).     Oxycodone HCl 10 MG Tabs  Take 1-2 tablets (10-20 mg total) by mouth every 4 (four) hours as needed for severe pain.     polyethylene glycol packet  Commonly known as:  MIRALAX / GLYCOLAX  Take 17 g by mouth 2 (two) times  daily.     potassium chloride SA 20 MEQ tablet  Commonly known as:  K-DUR,KLOR-CON  Take 20 mEq by mouth every morning.     PROBIOTIC DAILY PO  Take 1 capsule by mouth every morning. [facility].     pseudoephedrine 30 MG tablet  Commonly known as:  SUDAFED  Take 30 mg by mouth every 12 (twelve) hours as needed for congestion.     rivaroxaban 10 MG Tabs tablet  Commonly known as:  XARELTO  Take 1 tablet (10 mg total) by mouth daily.     sodium chloride 0.65 % Soln nasal spray  Commonly known as:  OCEAN  Place 1 spray into both nostrils daily.     SYSTANE OP  Apply 1 drop to eye at bedtime.     tiZANidine 4 MG tablet  Commonly known as:  ZANAFLEX  Take 1 tablet (4 mg total) by mouth every 6 (six) hours as needed for muscle spasms.     zolpidem 10 MG tablet  Commonly known as:  AMBIEN  Take 10 mg by mouth at bedtime.         Signed: [place]. Laverna Dossett   PA-C  08/22/2015, 7:40 AM

## 2015-08-31 DIAGNOSIS — Z96642 Presence of left artificial hip joint: Secondary | ICD-10-CM | POA: Diagnosis not present

## 2015-08-31 DIAGNOSIS — Z471 Aftercare following joint replacement surgery: Secondary | ICD-10-CM | POA: Diagnosis not present

## 2015-09-06 DIAGNOSIS — H43813 Vitreous degeneration, bilateral: Secondary | ICD-10-CM | POA: Diagnosis not present

## 2015-09-06 DIAGNOSIS — H31002 Unspecified chorioretinal scars, left eye: Secondary | ICD-10-CM | POA: Diagnosis not present

## 2015-09-06 DIAGNOSIS — H2513 Age-related nuclear cataract, bilateral: Secondary | ICD-10-CM | POA: Diagnosis not present

## 2015-09-22 DIAGNOSIS — H25811 Combined forms of age-related cataract, right eye: Secondary | ICD-10-CM | POA: Diagnosis not present

## 2015-09-22 DIAGNOSIS — H2511 Age-related nuclear cataract, right eye: Secondary | ICD-10-CM | POA: Diagnosis not present

## 2015-09-28 DIAGNOSIS — Z471 Aftercare following joint replacement surgery: Secondary | ICD-10-CM | POA: Diagnosis not present

## 2015-09-28 DIAGNOSIS — Z96642 Presence of left artificial hip joint: Secondary | ICD-10-CM | POA: Diagnosis not present

## 2015-10-20 DIAGNOSIS — H2512 Age-related nuclear cataract, left eye: Secondary | ICD-10-CM | POA: Diagnosis not present

## 2015-10-20 DIAGNOSIS — H25812 Combined forms of age-related cataract, left eye: Secondary | ICD-10-CM | POA: Diagnosis not present

## 2015-10-28 DIAGNOSIS — C61 Malignant neoplasm of prostate: Secondary | ICD-10-CM | POA: Diagnosis not present

## 2015-10-28 DIAGNOSIS — D090 Carcinoma in situ of bladder: Secondary | ICD-10-CM | POA: Diagnosis not present

## 2015-10-28 DIAGNOSIS — R350 Frequency of micturition: Secondary | ICD-10-CM | POA: Diagnosis not present

## 2015-10-28 DIAGNOSIS — Z0000 Encounter for general adult medical examination without abnormal findings: Secondary | ICD-10-CM | POA: Diagnosis not present

## 2015-10-28 DIAGNOSIS — C678 Malignant neoplasm of overlapping sites of bladder: Secondary | ICD-10-CM | POA: Diagnosis not present

## 2015-10-31 ENCOUNTER — Other Ambulatory Visit: Payer: Self-pay | Admitting: Urology

## 2015-11-09 NOTE — Patient Instructions (Addendum)
YOUR PROCEDURE IS SCHEDULED ON :  11/14/15  REPORT TO [facility] MAIN ENTRANCE FOLLOW SIGNS TO EAST ELEVATOR - GO TO 3rd FLOOR CHECK IN AT 3 EAST NURSES STATION (SHORT STAY) AT:  2:15 PM  CALL THIS NUMBER IF YOU HAVE PROBLEMS THE MORNING OF SURGERY [phone]  REMEMBER:ONLY 1 PER PERSON MAY GO TO SHORT STAY WITH YOU TO GET READY THE MORNING OF YOUR SURGERY  DO NOT EAT FOOD  AFTER MIDNIGHT  MAY HAVE CLEAR LIQUIDS UNTIL 10:15 AM  TAKE THESE MEDICINES THE MORNING OF SURGERY: SYNTHROID / OMEPRAZOLE / LORATIDINE / MAY TAKE OXYCODONE IF NEEDED FOR PAIN  CLEAR LIQUID DIET  Foods Allowed                                                                     Foods Excluded  Coffee and tea, regular and decaf                             liquids that you cannot  Plain Jell-O in any flavor                                             see through such as: Fruit ices (not with fruit pulp)                                     milk, soups, orange juice  Iced Popsicles                                                     All solid food Carbonated beverages, regular and diet                                    Cranberry, grape and apple juices Sports drinks like Gatorade Lightly seasoned clear broth or consume(fat free) Sugar, honey syrup    _____________________________________________________________________    YOU MAY NOT HAVE ANY METAL ON YOUR BODY INCLUDING HAIR PINS AND PIERCING'S. DO NOT WEAR JEWELRY, MAKEUP, LOTIONS, POWDERS OR PERFUMES. DO NOT WEAR NAIL POLISH. DO NOT SHAVE 48 HRS PRIOR TO SURGERY. MEN MAY SHAVE FACE AND NECK.  DO NOT [place]. [facility] IS NOT RESPONSIBLE FOR VALUABLES.  CONTACTS, DENTURES OR PARTIALS MAY NOT BE WORN TO SURGERY. LEAVE SUITCASE IN CAR. CAN BE BROUGHT TO ROOM AFTER SURGERY.  PATIENTS DISCHARGED THE DAY OF SURGERY WILL NOT BE ALLOWED TO DRIVE HOME.  PLEASE READ OVER THE FOLLOWING INSTRUCTION  SHEETS _________________________________________________________________________________                                          [facility] - PREPARING FOR  SURGERY  Before surgery, you can play an important role.  Because skin is not sterile, your skin needs to be as free of germs as possible.  You can reduce the number of germs on your skin by washing with CHG (chlorahexidine gluconate) soap before surgery.  CHG is an antiseptic cleaner which kills germs and bonds with the skin to continue killing germs even after washing. Please DO NOT use if you have an allergy to CHG or antibacterial soaps.  If your skin becomes reddened/irritated stop using the CHG and inform your nurse when you arrive at Short Stay. Do not shave (including legs and underarms) for at least 48 hours prior to the first CHG shower.  You may shave your face. Please follow these instructions carefully:   1.  Shower with CHG Soap the night before surgery and the  morning of Surgery.   2.  If you choose to wash your hair, wash your hair first as usual with your  normal  Shampoo.   3.  After you shampoo, rinse your hair and body thoroughly to remove the  shampoo.                                         4.  Use CHG as you would any other liquid soap.  You can apply chg directly  to the skin and wash . Gently wash with scrungie or clean wascloth    5.  Apply the CHG Soap to your body ONLY FROM THE NECK DOWN.   Do not use on open                           Wound or open sores. Avoid contact with eyes, ears mouth and genitals (private parts).                        Genitals (private parts) with your normal soap.              6.  Wash thoroughly, paying special attention to the area where your surgery  will be performed.   7.  Thoroughly rinse your body with warm water from the neck down.   8.  DO NOT shower/wash with your normal soap after using and rinsing off  the CHG Soap .                9.  Pat yourself dry with a clean  towel.             10.  Wear clean night clothes to bed after shower             11.  Place clean sheets on your bed the night of your first shower and do not  sleep with pets.  Day of Surgery : Do not apply any lotions/deodorants the morning of surgery.  Please wear clean clothes to the hospital/surgery center.  FAILURE TO FOLLOW THESE INSTRUCTIONS MAY RESULT IN THE CANCELLATION OF YOUR SURGERY    PATIENT SIGNATURE_________________________________  ______________________________________________________________________

## 2015-11-10 ENCOUNTER — Encounter (HOSPITAL_COMMUNITY)
Admission: RE | Admit: 2015-11-10 | Discharge: 2015-11-10 | Disposition: A | Discharge status: Home / Self Care | Payer: Medicare Other | Source: Ambulatory Visit | Attending: Urology | Admitting: Urology

## 2015-11-10 ENCOUNTER — Encounter (HOSPITAL_COMMUNITY): Payer: Self-pay

## 2015-11-10 DIAGNOSIS — Z01812 Encounter for preprocedural laboratory examination: Secondary | ICD-10-CM | POA: Insufficient documentation

## 2015-11-10 HISTORY — DX: Anesthesia of skin: R20.0

## 2015-11-10 LAB — BASIC METABOLIC PANEL
ANION GAP: 8 (ref 5–15)
BUN: 9 mg/dL (ref 6–20)
CALCIUM: 8.9 mg/dL (ref 8.9–10.3)
CHLORIDE: 98 mmol/L — AB (ref 101–111)
CO2: 28 mmol/L (ref 22–32)
CREATININE: 0.91 mg/dL (ref 0.61–1.24)
GFR calc non Af Amer: >60 mL/min (ref >60)
Glucose, Bld: 83 mg/dL (ref 65–99)
Potassium: 4.3 mmol/L (ref 3.5–5.1)
SODIUM: 134 mmol/L — AB (ref 135–145)

## 2015-11-10 LAB — CBC
HCT: 38.4 % — ABNORMAL LOW (ref 39.0–52.0)
Hemoglobin: 13 g/dL (ref 13.0–17.0)
MCH: 30.5 pg (ref 26.0–34.0)
MCHC: 33.9 g/dL (ref 30.0–36.0)
MCV: 90.1 fL (ref 78.0–100.0)
PLATELETS: 254 10*3/uL (ref 150–400)
RBC: 4.26 MIL/uL (ref 4.22–5.81)
RDW: 12.9 % (ref 11.5–15.5)
WBC: 6.1 10*3/uL (ref 4.0–10.5)

## 2015-11-11 NOTE — H&P (Signed)
History of Present Illness Mr. Jenetta Downer' Georgina Peer is 75 years old with the following urologic history:    1) Prostate cancer: He is s/p a BNS RAL radical prostatectomy on October 25, 2008. His PSA has been undetectable since treatment.    TNM stage: pT2c N0 Mx   Gleason score: 3+4=7   Surgical margins: Negative  Pretreatment PSA: 8.3  Pretreatment SHIM: 22 (He did use Levitra 20 mg preoperatively)    2) Urothelial carcinoma of the bladder: He presented in June 2016 with gross hematuria and a history of storage urinary symptoms. He was found to have a bladder tumor and TURBT revealed carcinoma in situ of the bladder.    Jul 2016: TURBT - CIS of the bladder  Sep-Oct 2016: Induction BCG    3) LUTS: He has had chronic urinary frequency even before his bladder cancer diagnosis.  Current treatment: Vesicare 5 mg  Prior treatment: Myrbetriq (stopped due to itching)    Interval history:    He follows up today for bladder cancer surveillance. He states he tolerated BCG relatively well although felt somewhat fatigued and malaise during treatment. He feels that he has returned to his baseline. With regard to his voiding symptoms, he denies any hematuria. He continues to complain of urinary frequency although this is stable compared to his baseline prior to treatment of his bladder cancer. He follows up today for surveillance cystoscopy.     Past Medical History Problems  1. History of Anxiety (F41.9) 2. History of Arthritis 3. History of Bipolar Disorder 4. History of depression (Z86.59) 5. History of heartburn (Z87.898) 6. History of hypercholesterolemia (Z86.39) 7. History of hypothyroidism (Z86.39) 8. History of Intermetatarsal Neuroma 9. History of Left inguinal hernia (K40.90) 10. Prostate cancer (C61)  Surgical History Problems  1. History of Appendectomy 2. History of Cataract Surgery 3. History of Cystoscopy With Fulguration Large Lesion (Over 5cm) 4. History of  Foot Surgery 5. History of Inguinal Hernia Repair 6. History of Prostatect Retropubic Radical W/ Nerve Sparing Laparoscopic 7. History of Tonsillectomy 8. History of Total Knee Arthroplasty  Current Meds 1. Acetaminophen 500 MG Oral Capsule;  Therapy: (Recorded:20Jun2016) to Recorded 2. Furosemide 40 MG Oral Tablet;  Therapy: (Recorded:16Aug2016) to Recorded 3. Gabapentin 300 MG TABS;  Therapy: (Recorded:20Jun2016) to Recorded 4. K-Dur 10 MEQ TBCR;  Therapy: (Recorded:16Aug2016) to Recorded 5. Loratadine 10 MG Oral Tablet;  Therapy: (Recorded:10Sep2009) to Recorded 6. Losartan Potassium 50 MG Oral Tablet;  Therapy: (Recorded:16Aug2016) to Recorded 7. Meloxicam 15 MG Oral Tablet;  Therapy: [id number] to Recorded 8. Natural Fiber Laxative POWD;  Therapy: (Recorded:10Sep2009) to Recorded 9. Omeprazole 20 MG Oral Capsule Delayed Release;  Therapy: [id number] to Recorded 10. Oxybutynin Chloride ER 10 MG Oral Tablet Extended Release 24 Hour; Take 1 tablet   daily;   Therapy: 01Sep2016 to (Evaluate:27Aug2017); Last Rx:01Sep2016 Ordered 11. OxyCODONE HCl - 5 MG Oral Tablet;   Therapy: (Recorded:16Aug2016) to Recorded 12. Oxycodone-Acetaminophen 5-325 MG Oral Tablet;   Therapy: (Recorded:16Aug2016) to Recorded 13. Saline Nasal Spray SOLN;   Therapy: (Recorded:10Sep2009) to Recorded 14. Synthroid 137 MCG Oral Tablet;   Therapy: 27Feb2013 to Recorded 15. VESIcare 10 MG Oral Tablet; Take 1 tablet daily;   Therapy: 31Aug2016 to (Renew:26Aug2017)  Requested for: 31Aug2016; Last   Rx:31Aug2016 Ordered 16. Zolpidem Tartrate 10 MG Oral Tablet;   Therapy: (Recorded:10Sep2009) to Recorded  Allergies Medication  1. Myrbetriq TB24 2. Cefdinir CAPS Non-Medication  3. Dust 4. Mold  Family History Problems  1. Family history of Malignant Melanoma Of  The Skin : Father 2. Denied: Family history of Prostate Cancer  Social History Problems  1. Denied: History of Alcohol Use (History) 2.  Never A Smoker 3. Occupation: Retired 43. Denied: History of Tobacco Use  Vitals Vital Signs [Data Includes: Last 1 Day]  Recorded: [id number] 01:56PM  Weight: 175 lb  BMI Calculated: 21.58 BSA Calculated: 2.08 Blood Pressure: 143 / 90 Heart Rate: 66  Physical Exam Constitutional: Well nourished and well developed . No acute distress.  ENT:. The ears and nose are normal in appearance.  Neck: The appearance of the neck is normal and no neck mass is present.  Pulmonary: No respiratory distress, normal respiratory rhythm and effort and clear bilateral breath sounds.  Genitourinary: Examination of the penis demonstrates no lesions and a normal meatus.    Results/Data Urine [Data Includes: Last 1 Day]   [id number]  COLOR YELLOW   APPEARANCE CLEAR   SPECIFIC GRAVITY 1.010   pH 6.0   GLUCOSE NEGATIVE   BILIRUBIN NEGATIVE   KETONE NEGATIVE   BLOOD 1+   PROTEIN NEGATIVE   NITRITE NEGATIVE   LEUKOCYTE ESTERASE NEGATIVE   SQUAMOUS EPITHELIAL/HPF 0-5 HPF  WBC 0-5 WBC/HPF  RBC 0-2 RBC/HPF  BACTERIA NONE SEEN HPF  CRYSTALS NONE SEEN HPF  CASTS NONE SEEN LPF  Yeast NONE SEEN HPF   Procedure  Procedure: Cystoscopy  Chaperone Present: Ashley L.  Indication: History of Urothelial Carcinoma.  Informed Consent: Risks, benefits, and potential adverse events were discussed and informed consent was obtained from the patient.  Prep: The patient was prepped with betadine.  Anesthesia:. Local anesthesia was administered intraurethrally with 2% lidocaine jelly.  Antibiotic prophylaxis: Ciprofloxacin.  Procedure Note:  Urethral meatus:. No abnormalities.  Anterior urethra: No abnormalities.  Prostatic urethra:. Surgically absent.  Bladder: Visulization was clear. The ureteral orifices were in the normal anatomic position bilaterally and had clear efflux of urine. Systematic examination of the bladder reveals what appears to be velvety tumor anteriorly just inside the bladder neck and  extending approximately one third the way toward the bladder dome. This extends toward the left side of the bladder and possibly down toward the bladder trigone. There also are patchy areas of erythema and possible carcinoma in situ diffusely in the bladder. A saline bladder washing was obtained and sent for cytologic analysis. The patient tolerated the procedure well.  Complications: None.    Assessment Assessed  1. Prostate cancer (C61) 2. Carcinoma in situ of bladder (D09.0) 3. Increased urinary frequency (R35.0)  Plan Health Maintenance  1. UA With REFLEX; [Do Not Release]; Status:Complete;   [id number] 01:38PM Malignant neoplasm of overlapping sites of bladder  2. Follow-up Office  Follow-up  Status: Complete  Done: [id number]  Discussion/Summary 1. High-risk non muscle invasive bladder cancer: His findings today on cystoscopy are highly concerning for persistent/recurrent carcinoma in situ of the bladder. I have recommended that we proceed with repeat transurethral resection for further evaluation and staging. If he does have persistent/recurrent carcinoma in situ, we have discussed proceeding with a repeat induction course of BCG. He understands that if he truly is refractory to BCG that he may require radical cystectomy. I recommended that he proceed with cystoscopy, exam under anesthesia, and transurethral resection of bladder tumor. We reviewed the potential risks and complications associated with this procedure as well as the expected recovery process. This will be arranged for the near future.    2. Prostate cancer: His next PSA will be due for April 2017.  Cc: Dr. Burnard Bunting     Verified Results URINE CYTOLOGY1 [id number] 04:58PM1 Read Drivers  SPECIMEN TYPE: OTHER   Test Name Result Flag Reference  FINAL DIAGNOSIS:1  A1   - POSITIVE FOR MALIGNANCY. HIGH-GRADE UROTHELIAL CARCINOMA. DUE TO THE DIAGNOSIS RENDERED ON THIS PATIENT WE REQUEST THAT  THE CYTOLOGY LABORATORY BE PROVIDED WITH ADDITIONAL FOLLOW UP ON THE PATIENT WHEN AVAILABLE. THERE HAS BEEN AN INTRADEPARTMENTAL REVIEW AND THE CONSULTED PATHOLOGIST CONCURS WITH THE FINAL DIAGNOSIS.  SOURCE:1 Bladder Washing1    100CC OF CLEAR YELLOW BLW IN FIXATIVE 1 SLIDE PREPARED [id number] LW  Relevant Clinical Info1     MALIGNANT NEOPLASM OF OVERLAPPING SITES OF BLADDER  PATHOLOGIST:1     REVIEWED BY S. SERDAR DEMIRCI, MD, (ELECTRONIC SIGNATURE ON FILE)  NUMBER OF SLIDES1     1 Container Submitted  CYTOTECHNOLOGIST:1     MJS, BA CT(ASCP)     1. Amended By: Raynelle Bring; Oct 31 2015 5:14 PM EST  Signatures Electronically signed by : Raynelle Bring, M.D.; Oct 31 2015  5:14PM EST

## 2015-11-14 ENCOUNTER — Ambulatory Visit (HOSPITAL_COMMUNITY): Payer: Medicare Other | Admitting: Anesthesiology

## 2015-11-14 ENCOUNTER — Encounter (HOSPITAL_COMMUNITY)
Admission: RE | Disposition: A | Discharge status: Home / Self Care | Payer: Self-pay | Source: Ambulatory Visit | Attending: Urology

## 2015-11-14 ENCOUNTER — Ambulatory Visit (HOSPITAL_COMMUNITY)
Admission: RE | Admit: 2015-11-14 | Discharge: 2015-11-14 | Disposition: A | Discharge status: Home / Self Care | Payer: Medicare Other | Source: Ambulatory Visit | Attending: Urology | Admitting: Urology

## 2015-11-14 ENCOUNTER — Encounter (HOSPITAL_COMMUNITY): Payer: Self-pay | Admitting: *Deleted

## 2015-11-14 DIAGNOSIS — D090 Carcinoma in situ of bladder: Secondary | ICD-10-CM | POA: Insufficient documentation

## 2015-11-14 DIAGNOSIS — E7800 Pure hypercholesterolemia, unspecified: Secondary | ICD-10-CM | POA: Diagnosis not present

## 2015-11-14 DIAGNOSIS — M1990 Unspecified osteoarthritis, unspecified site: Secondary | ICD-10-CM | POA: Insufficient documentation

## 2015-11-14 DIAGNOSIS — I10 Essential (primary) hypertension: Secondary | ICD-10-CM | POA: Insufficient documentation

## 2015-11-14 DIAGNOSIS — F319 Bipolar disorder, unspecified: Secondary | ICD-10-CM | POA: Insufficient documentation

## 2015-11-14 DIAGNOSIS — C679 Malignant neoplasm of bladder, unspecified: Secondary | ICD-10-CM | POA: Diagnosis not present

## 2015-11-14 DIAGNOSIS — Z79899 Other long term (current) drug therapy: Secondary | ICD-10-CM | POA: Insufficient documentation

## 2015-11-14 DIAGNOSIS — Z96649 Presence of unspecified artificial hip joint: Secondary | ICD-10-CM

## 2015-11-14 DIAGNOSIS — K219 Gastro-esophageal reflux disease without esophagitis: Secondary | ICD-10-CM | POA: Diagnosis not present

## 2015-11-14 DIAGNOSIS — F419 Anxiety disorder, unspecified: Secondary | ICD-10-CM | POA: Diagnosis not present

## 2015-11-14 DIAGNOSIS — Z8546 Personal history of malignant neoplasm of prostate: Secondary | ICD-10-CM | POA: Diagnosis not present

## 2015-11-14 DIAGNOSIS — E039 Hypothyroidism, unspecified: Secondary | ICD-10-CM | POA: Diagnosis not present

## 2015-11-14 DIAGNOSIS — Z888 Allergy status to other drugs, medicaments and biological substances status: Secondary | ICD-10-CM | POA: Diagnosis not present

## 2015-11-14 HISTORY — PX: TRANSURETHRAL RESECTION OF BLADDER TUMOR: SHX2575

## 2015-11-14 SURGERY — TURBT (TRANSURETHRAL RESECTION OF BLADDER TUMOR)
Anesthesia: General | Site: Bladder

## 2015-11-14 MED ORDER — ROCURONIUM BROMIDE 100 MG/10ML IV SOLN
INTRAVENOUS | Status: DC | PRN
  Administered 2015-11-14: 5 mg via INTRAVENOUS
  Administered 2015-11-14: 40 mg via INTRAVENOUS

## 2015-11-14 MED ORDER — PROMETHAZINE HCL 25 MG/ML IJ SOLN: 6.2500 mg | INTRAMUSCULAR | Status: DC | PRN

## 2015-11-14 MED ORDER — HYDROCODONE-ACETAMINOPHEN 5-325 MG PO TABS: 1.0000 | ORAL_TABLET | Freq: Four times a day (QID) | ORAL | Status: DC | PRN

## 2015-11-14 MED ORDER — MIDAZOLAM HCL 2 MG/2ML IJ SOLN
INTRAMUSCULAR | Status: AC
  Filled 2015-11-14: qty 2

## 2015-11-14 MED ORDER — LACTATED RINGERS IV SOLN
INTRAVENOUS | Status: DC
  Administered 2015-11-14: 18:00:00 via INTRAVENOUS
  Administered 2015-11-14: 1000 mL via INTRAVENOUS

## 2015-11-14 MED ORDER — HYDROMORPHONE HCL 1 MG/ML IJ SOLN
0.2500 mg | INTRAMUSCULAR | Status: DC | PRN
  Administered 2015-11-14: 0.5 mg via INTRAVENOUS
  Administered 2015-11-14 (×2): 0.25 mg via INTRAVENOUS

## 2015-11-14 MED ORDER — FENTANYL CITRATE (PF) 100 MCG/2ML IJ SOLN
INTRAMUSCULAR | Status: AC
  Filled 2015-11-14: qty 2

## 2015-11-14 MED ORDER — PROPOFOL 10 MG/ML IV BOLUS
INTRAVENOUS | Status: DC | PRN
Start: 2015-11-14 — End: 2015-11-14
  Administered 2015-11-14: 150 mg via INTRAVENOUS

## 2015-11-14 MED ORDER — 0.9 % SODIUM CHLORIDE (POUR BTL) OPTIME
TOPICAL | Status: DC | PRN
  Administered 2015-11-14: 1000 mL

## 2015-11-14 MED ORDER — FENTANYL CITRATE (PF) 100 MCG/2ML IJ SOLN
INTRAMUSCULAR | Status: DC | PRN
  Administered 2015-11-14 (×2): 50 ug via INTRAVENOUS

## 2015-11-14 MED ORDER — ONDANSETRON HCL 4 MG/2ML IJ SOLN
INTRAMUSCULAR | Status: DC | PRN
  Administered 2015-11-14: 4 mg via INTRAVENOUS

## 2015-11-14 MED ORDER — PROPOFOL 10 MG/ML IV BOLUS
INTRAVENOUS | Status: AC
  Filled 2015-11-14: qty 20

## 2015-11-14 MED ORDER — HYDROMORPHONE HCL 1 MG/ML IJ SOLN
INTRAMUSCULAR | Status: AC
  Filled 2015-11-14: qty 1

## 2015-11-14 MED ORDER — ONDANSETRON HCL 4 MG/2ML IJ SOLN
INTRAMUSCULAR | Status: AC
  Filled 2015-11-14: qty 2

## 2015-11-14 MED ORDER — EPHEDRINE SULFATE 50 MG/ML IJ SOLN
INTRAMUSCULAR | Status: DC | PRN
  Administered 2015-11-14 (×2): 5 mg via INTRAVENOUS

## 2015-11-14 MED ORDER — LIDOCAINE HCL (CARDIAC) 20 MG/ML IV SOLN
INTRAVENOUS | Status: AC
  Filled 2015-11-14: qty 5

## 2015-11-14 MED ORDER — SUGAMMADEX SODIUM 200 MG/2ML IV SOLN
INTRAVENOUS | Status: DC | PRN
  Administered 2015-11-14: 200 mg via INTRAVENOUS

## 2015-11-14 MED ORDER — CIPROFLOXACIN IN D5W 400 MG/200ML IV SOLN
400.0000 mg | INTRAVENOUS | Status: AC
  Administered 2015-11-14: 400 mg via INTRAVENOUS

## 2015-11-14 MED ORDER — MIDAZOLAM HCL 5 MG/5ML IJ SOLN
INTRAMUSCULAR | Status: DC | PRN
Start: 2015-11-14 — End: 2015-11-14
  Administered 2015-11-14: 2 mg via INTRAVENOUS

## 2015-11-14 MED ORDER — LIDOCAINE HCL (CARDIAC) 20 MG/ML IV SOLN
INTRAVENOUS | Status: DC | PRN
  Administered 2015-11-14: 100 mg via INTRAVENOUS

## 2015-11-14 MED ORDER — SODIUM CHLORIDE 0.9 % IR SOLN
Status: DC | PRN
  Administered 2015-11-14: 6000 mL

## 2015-11-14 MED ORDER — CIPROFLOXACIN HCL 500 MG PO TABS: 500.0000 mg | ORAL_TABLET | Freq: Two times a day (BID) | ORAL | Status: DC

## 2015-11-14 MED ORDER — SUCCINYLCHOLINE CHLORIDE 20 MG/ML IJ SOLN
INTRAMUSCULAR | Status: DC | PRN
  Administered 2015-11-14: 100 mg via INTRAVENOUS

## 2015-11-14 MED ORDER — CIPROFLOXACIN IN D5W 400 MG/200ML IV SOLN
INTRAVENOUS | Status: AC
  Filled 2015-11-14: qty 200

## 2015-11-14 MED ORDER — SUGAMMADEX SODIUM 200 MG/2ML IV SOLN
INTRAVENOUS | Status: AC
  Filled 2015-11-14: qty 2

## 2015-11-14 SURGICAL SUPPLY — 16 items
BAG URINE DRAINAGE (UROLOGICAL SUPPLIES) ×4 IMPLANT
BAG URO CATCHER STRL LF (MISCELLANEOUS) ×4 IMPLANT
CATH FOLEY 2WAY SLVR  5CC 16FR (CATHETERS) ×2
CATH FOLEY 2WAY SLVR 5CC 16FR (CATHETERS) ×2 IMPLANT
ELECT REM PT RETURN 9FT ADLT (ELECTROSURGICAL) ×4
ELECTRODE REM PT RTRN 9FT ADLT (ELECTROSURGICAL) ×2 IMPLANT
EVACUATOR MICROVAS BLADDER (UROLOGICAL SUPPLIES) IMPLANT
GLOVE BIOGEL M STRL SZ7.5 (GLOVE) ×4 IMPLANT
GOWN STRL REUS W/TWL LRG LVL3 (GOWN DISPOSABLE) ×4 IMPLANT
KIT ASPIRATION TUBING (SET/KITS/TRAYS/PACK) IMPLANT
LOOP CUT BIPOLAR 24F LRG (ELECTROSURGICAL) ×4 IMPLANT
MANIFOLD NEPTUNE II (INSTRUMENTS) ×4 IMPLANT
PACK CYSTO (CUSTOM PROCEDURE TRAY) ×4 IMPLANT
SYRINGE IRR TOOMEY STRL 70CC (SYRINGE) IMPLANT
TUBING CONNECTING 10 (TUBING) ×3 IMPLANT
TUBING CONNECTING 10' (TUBING) ×1

## 2015-11-14 NOTE — Discharge Instructions (Signed)

## 2015-11-14 NOTE — Anesthesia Postprocedure Evaluation (Signed)
Anesthesia Post Note  Patient: Ronald Lowery  Procedure(s) Performed: Procedure(s) (LRB): TRANSURETHRAL RESECTION OF BLADDER TUMOR (TURBT) (N/A)  Patient location during evaluation: PACU Anesthesia Type: General Level of consciousness: awake and alert Pain management: pain level controlled Vital Signs Assessment: post-procedure vital signs reviewed and stable Respiratory status: spontaneous breathing, nonlabored ventilation, respiratory function stable and patient connected to nasal cannula oxygen Cardiovascular status: blood pressure returned to baseline and stable Postop Assessment: no signs of nausea or vomiting Anesthetic complications: no    Last Vitals:  Filed Vitals:   11/14/15 1404 11/14/15 1900  BP: 139/71 156/81  Pulse: 63 68  Temp: 36.4 C 36.4 C  Resp: 18 14    Last Pain:  Filed Vitals:   11/14/15 1912  PainSc: 3                  Zenaida Deed

## 2015-11-14 NOTE — Transfer of Care (Signed)
Immediate Anesthesia Transfer of Care Note  Patient: Ronald Lowery  Procedure(s) Performed: Procedure(s): TRANSURETHRAL RESECTION OF BLADDER TUMOR (TURBT) (N/A)  Patient Location: PACU  Anesthesia Type:General  Level of Consciousness:  sedated, patient cooperative and responds to stimulation  Airway & Oxygen Therapy:Patient Spontanous Breathing and Patient connected to face mask oxgen  Post-op Assessment:  Report given to PACU RN and Post -op Vital signs reviewed and stable  Post vital signs:  Reviewed and stable  Last Vitals:  Filed Vitals:   11/14/15 1404  BP: 139/71  Pulse: 63  Temp: 36.4 C  Resp: 18    Complications: No apparent anesthesia complications

## 2015-11-14 NOTE — Op Note (Addendum)
Preoperative diagnosis:     1. Carcinoma in situ of the bladder  Postoperative diagnosis:  1. Carcinoma in situ of the bladder  Procedure:  1. Cystoscopy 2. Transurethral resection of bladder tumor (> 5 cm)  Surgeon: Roxy Horseman, Brooke Bonito. M.D.  Anesthesia: General  Complications: None  Intraoperative findings: There was diffuse raised velvety erythematous tumor located just inside the bladder neck toward the base of the bladder and extending laterally on either side.  There were also a few patchy areas throughout the bladder also raising suspicion for possible tumor growth.  EBL: Minimal  Specimens: 1. Bladder tumor  Disposition of specimens: Pathology  Indication: Ronald Lowery is a patient who has a history of carcinoma in situ of the bladder. After undergoing induction BCG, he underwent surveillance cystoscopy revealing possible recurrence.  Bladder washing for cytology was positive consistent with recurrence. After reviewing the management options for treatment, he elected to proceed with the above surgical procedure(s). We have discussed the potential benefits and risks of the procedure, side effects of the proposed treatment, the likelihood of the patient achieving the goals of the procedure, and any potential problems that might occur during the procedure or recuperation. Informed consent has been obtained.  Description of procedure:  The patient was taken to the operating room and general anesthesia was induced.  The patient was placed in the dorsal lithotomy position, prepped and draped in the usual sterile fashion, and preoperative antibiotics were administered. A preoperative time-out was performed.   Cystourethroscopy was performed.  The patient's urethra was examined and was normal. The prostatic urethra was surgically absent.  This was consistent with his history of radical prostatectomy.   The bladder was then systematically examined in its entirety. Examination  revealed a raised, velvety area of probable tumor just inside the bladder neck ventrally and extending laterally  On either side of the bladder neck.  There were patchy areas of possible erythematous tumor throughout the bladder as well. The ureteral orifices were in the expected anatomic locations and were well away from any of the tumor sites.  The bladder was then re-examined after the resectoscope was placed. Using loop bipolar cautery resection, the entire tumor was resected and/or cauterized with specimen removed for permanent pathologic analysis. Overall, an area greater than 5 cm was resected/treated. Additional areas of concern and suspicion were also cauterized until all visible, suspicioustumor sites were treated.  Hemostasis was then achieved with the loop cautery and the bladder was emptied and reinspected with no further bleeding noted at the end of the procedure.  A 16 French Foley catheter was left indwelling due to the extent of the resection.   The patient appeared to tolerate the procedure well and without complications.  The patient was able to be awakened and transferred to the recovery unit in satisfactory condition.    [name] MD

## 2015-11-14 NOTE — Anesthesia Procedure Notes (Signed)
Procedure Name: Intubation Date/Time: 11/14/2015 6:08 PM Performed by: Maxwell Caul Pre-anesthesia Checklist: Patient identified, Emergency Drugs available, Suction available and Patient being monitored Patient Re-evaluated:Patient Re-evaluated prior to inductionOxygen Delivery Method: Circle System Utilized Preoxygenation: Pre-oxygenation with 100% oxygen Intubation Type: IV induction Ventilation: Mask ventilation without difficulty Laryngoscope Size: Mac and 4 Grade View: Grade II Tube type: Oral Tube size: 7.5 mm Number of attempts: 1 Airway Equipment and Method: Stylet Placement Confirmation: ETT inserted through vocal cords under direct vision,  positive ETCO2 and breath sounds checked- equal and bilateral Secured at: 21 cm Tube secured with: Tape Dental Injury: Teeth and Oropharynx as per pre-operative assessment

## 2015-11-14 NOTE — Interval H&P Note (Signed)
History and Physical Interval Note:  11/14/2015 2:19 PM  Ronald Lowery  has presented today for surgery, with the diagnosis of BLADDER CANCER  The various methods of treatment have been discussed with the patient and family. After consideration of risks, benefits and other options for treatment, the patient has consented to  Procedure(s): CYSTOSCOPY AND EXAM UNDER ANESTHESIA (N/A) TRANSURETHRAL RESECTION OF BLADDER TUMOR (TURBT) (N/A) as a surgical intervention .  The patient's history has been reviewed, patient examined, no change in status, stable for surgery.  I have reviewed the patient's chart and labs.  Questions were answered to the patient's satisfaction.     Jake Goodson,[name]

## 2015-11-14 NOTE — Anesthesia Preprocedure Evaluation (Signed)
Anesthesia Evaluation  Patient identified by MRN, date of birth, ID band Patient awake    Reviewed: Allergy & Precautions, NPO status , Patient's Chart, lab work & pertinent test results  History of Anesthesia Complications (+) PROLONGED EMERGENCE and history of anesthetic complications  Airway Mallampati: II  TM Distance: >3 FB Neck ROM: Full    Dental no notable dental hx.    Pulmonary neg pulmonary ROS,    Pulmonary exam normal breath sounds clear to auscultation       Cardiovascular Exercise Tolerance: Good hypertension, Pt. on medications Normal cardiovascular exam Rhythm:Regular Rate:Normal     Neuro/Psych Anxiety Depression negative neurological ROS  negative psych ROS   GI/Hepatic Neg liver ROS, GERD  Medicated,  Endo/Other  Hypothyroidism   Renal/GU negative Renal ROS  negative genitourinary   Musculoskeletal  (+) Arthritis ,   Abdominal   Peds negative pediatric ROS (+)  Hematology negative hematology ROS (+)   Anesthesia Other Findings   Reproductive/Obstetrics negative OB ROS                             Anesthesia Physical  Anesthesia Plan  ASA: II  Anesthesia Plan: General   Post-op Pain Management:    Induction: Intravenous  Airway Management Planned: Oral ETT  Additional Equipment:   Intra-op Plan:   Post-operative Plan: Extubation in OR  Informed Consent: I have reviewed the patients History and Physical, chart, labs and discussed the procedure including the risks, benefits and alternatives for the proposed anesthesia with the patient or authorized representative who has indicated his/her understanding and acceptance.   Dental advisory given  Plan Discussed with: CRNA  Anesthesia Plan Comments: (Previously needed muscle relaxation with ETT. Grade II view)        Anesthesia Quick Evaluation

## 2015-11-15 ENCOUNTER — Encounter (HOSPITAL_COMMUNITY): Payer: Self-pay | Admitting: Urology

## 2015-11-17 DIAGNOSIS — Z96642 Presence of left artificial hip joint: Secondary | ICD-10-CM | POA: Diagnosis not present

## 2015-11-17 DIAGNOSIS — Z471 Aftercare following joint replacement surgery: Secondary | ICD-10-CM | POA: Diagnosis not present

## 2015-11-18 DIAGNOSIS — D090 Carcinoma in situ of bladder: Secondary | ICD-10-CM | POA: Diagnosis not present

## 2015-11-23 DIAGNOSIS — C678 Malignant neoplasm of overlapping sites of bladder: Secondary | ICD-10-CM | POA: Diagnosis not present

## 2015-11-23 DIAGNOSIS — D090 Carcinoma in situ of bladder: Secondary | ICD-10-CM | POA: Diagnosis not present

## 2015-11-23 DIAGNOSIS — Z0000 Encounter for general adult medical examination without abnormal findings: Secondary | ICD-10-CM | POA: Diagnosis not present

## 2015-11-28 ENCOUNTER — Other Ambulatory Visit: Payer: Self-pay | Admitting: Urology

## 2015-12-09 DIAGNOSIS — E038 Other specified hypothyroidism: Secondary | ICD-10-CM | POA: Diagnosis not present

## 2015-12-09 DIAGNOSIS — R8290 Unspecified abnormal findings in urine: Secondary | ICD-10-CM | POA: Diagnosis not present

## 2015-12-09 DIAGNOSIS — Z125 Encounter for screening for malignant neoplasm of prostate: Secondary | ICD-10-CM | POA: Diagnosis not present

## 2015-12-09 DIAGNOSIS — N390 Urinary tract infection, site not specified: Secondary | ICD-10-CM | POA: Diagnosis not present

## 2015-12-09 DIAGNOSIS — I10 Essential (primary) hypertension: Secondary | ICD-10-CM | POA: Diagnosis not present

## 2015-12-13 NOTE — Patient Instructions (Addendum)
Ronald Lowery  12/13/2015   Your procedure is scheduled on: 12/19/2015    Report to [facility] Main  Entrance take [place]  elevators to 3rd floor to  [place] at    215pm  Call this number if you have problems the morning of surgery [phone]   Remember: ONLY 1 PERSON MAY GO WITH YOU TO SHORT STAY TO GET  READY MORNING OF YOUR SURGERY.  Do not eat food or drink liquids :After Midnight.     Take these medicines the morning of surgery with A SIP OF WATER: Synthroid, Claritin if needed, Prilosec, Oxycodone if needed                                You may not have any metal on your body including hair pins and              piercings  Do not wear jewelry,lotions, powders or perfumes, deodorant                       Men may shave face and neck.   Do not bring valuables to the hospital. [place].  Contacts, dentures or bridgework may not be worn into surgery.      Patients discharged the day of surgery will not be allowed to drive home.  Name and phone number of your driver:  Special Instructions: coughing and deep breathing exercises, leg exercises               Please read over the following fact sheets you were given: _____________________________________________________________________             [facility] - Preparing for Surgery Before surgery, you can play an important role.  Because skin is not sterile, your skin needs to be as free of germs as possible.  You can reduce the number of germs on your skin by washing with CHG (chlorahexidine gluconate) soap before surgery.  CHG is an antiseptic cleaner which kills germs and bonds with the skin to continue killing germs even after washing. Please DO NOT use if you have an allergy to CHG or antibacterial soaps.  If your skin becomes reddened/irritated stop using the CHG and inform your nurse when you arrive at Short Stay. Do not shave (including  legs and underarms) for at least 48 hours prior to the first CHG shower.  You may shave your face/neck. Please follow these instructions carefully:  1.  Shower with CHG Soap the night before surgery and the  morning of Surgery.  2.  If you choose to wash your hair, wash your hair first as usual with your  normal  shampoo.  3.  After you shampoo, rinse your hair and body thoroughly to remove the  shampoo.                           4.  Use CHG as you would any other liquid soap.  You can apply chg directly  to the skin and wash                       Gently with a scrungie or  clean washcloth.  5.  Apply the CHG Soap to your body ONLY FROM THE NECK DOWN.   Do not use on face/ open                           Wound or open sores. Avoid contact with eyes, ears mouth and genitals (private parts).                       Wash face,  Genitals (private parts) with your normal soap.             6.  Wash thoroughly, paying special attention to the area where your surgery  will be performed.  7.  Thoroughly rinse your body with warm water from the neck down.  8.  DO NOT shower/wash with your normal soap after using and rinsing off  the CHG Soap.                9.  Pat yourself dry with a clean towel.            10.  Wear clean pajamas.            11.  Place clean sheets on your bed the night of your first shower and do not  sleep with pets. Day of Surgery : Do not apply any lotions/deodorants the morning of surgery.  Please wear clean clothes to the hospital/surgery center.  FAILURE TO FOLLOW THESE INSTRUCTIONS MAY RESULT IN THE CANCELLATION OF YOUR SURGERY PATIENT SIGNATURE_________________________________  NURSE SIGNATURE__________________________________  ________________________________________________________________________    CLEAR LIQUID DIET   Foods Allowed                                                                     Foods Excluded  Coffee and tea, regular and decaf                              liquids that you cannot  Plain Jell-O in any flavor                                             see through such as: Fruit ices (not with fruit pulp)                                     milk, soups, orange juice  Iced Popsicles                                    All solid food Carbonated beverages, regular and diet                                    Cranberry, grape and apple juices Sports drinks like Gatorade Lightly seasoned clear broth or consume(fat free) Sugar, honey syrup  Sample Menu Breakfast  Lunch                                     Supper Cranberry juice                    Beef broth                            Chicken broth Jell-O                                     Grape juice                           Apple juice Coffee or tea                        Jell-O                                      Popsicle                                                Coffee or tea                        Coffee or tea  _____________________________________________________________________

## 2015-12-14 ENCOUNTER — Encounter (HOSPITAL_COMMUNITY)
Admission: RE | Admit: 2015-12-14 | Discharge: 2015-12-14 | Disposition: A | Discharge status: Home / Self Care | Payer: Medicare Other | Source: Ambulatory Visit | Attending: Urology | Admitting: Urology

## 2015-12-14 ENCOUNTER — Encounter (HOSPITAL_COMMUNITY): Payer: Self-pay

## 2015-12-14 DIAGNOSIS — Z01818 Encounter for other preprocedural examination: Secondary | ICD-10-CM | POA: Insufficient documentation

## 2015-12-14 DIAGNOSIS — C679 Malignant neoplasm of bladder, unspecified: Secondary | ICD-10-CM | POA: Insufficient documentation

## 2015-12-14 HISTORY — DX: Asymptomatic varicose veins of unspecified lower extremity: I83.90

## 2015-12-14 HISTORY — DX: Malignant neoplasm of prostate: C61

## 2015-12-14 NOTE — Progress Notes (Signed)
2V CXR- 05/03/15-EPIC  EKG-02/15/15- Under correspondence

## 2015-12-14 NOTE — Progress Notes (Signed)
Labs done 12/09/2015 on chart of : U/A, Urine Culture,. CBC, CMP and Lipids on chart.

## 2015-12-15 DIAGNOSIS — M545 Low back pain: Secondary | ICD-10-CM | POA: Diagnosis not present

## 2015-12-15 DIAGNOSIS — I10 Essential (primary) hypertension: Secondary | ICD-10-CM | POA: Diagnosis not present

## 2015-12-15 DIAGNOSIS — M4806 Spinal stenosis, lumbar region: Secondary | ICD-10-CM | POA: Diagnosis not present

## 2015-12-15 DIAGNOSIS — F5221 Male erectile disorder: Secondary | ICD-10-CM | POA: Diagnosis not present

## 2015-12-15 DIAGNOSIS — C61 Malignant neoplasm of prostate: Secondary | ICD-10-CM | POA: Diagnosis not present

## 2015-12-15 DIAGNOSIS — K219 Gastro-esophageal reflux disease without esophagitis: Secondary | ICD-10-CM | POA: Diagnosis not present

## 2015-12-15 DIAGNOSIS — Z1389 Encounter for screening for other disorder: Secondary | ICD-10-CM | POA: Diagnosis not present

## 2015-12-15 DIAGNOSIS — Z6821 Body mass index (BMI) 21.0-21.9, adult: Secondary | ICD-10-CM | POA: Diagnosis not present

## 2015-12-15 DIAGNOSIS — F329 Major depressive disorder, single episode, unspecified: Secondary | ICD-10-CM | POA: Diagnosis not present

## 2015-12-15 DIAGNOSIS — I872 Venous insufficiency (chronic) (peripheral): Secondary | ICD-10-CM | POA: Diagnosis not present

## 2015-12-15 DIAGNOSIS — D494 Neoplasm of unspecified behavior of bladder: Secondary | ICD-10-CM | POA: Diagnosis not present

## 2015-12-15 DIAGNOSIS — E784 Other hyperlipidemia: Secondary | ICD-10-CM | POA: Diagnosis not present

## 2015-12-16 DIAGNOSIS — Z1212 Encounter for screening for malignant neoplasm of rectum: Secondary | ICD-10-CM | POA: Diagnosis not present

## 2015-12-16 NOTE — H&P (Signed)
History of Present Illness [name] is 75 years old with the following urologic history:    1) Prostate cancer: He is s/p a BNS RAL radical prostatectomy on October 25, 2008. His PSA has been undetectable since treatment.    TNM stage: pT2c N0 Mx   Gleason score: 3+4=7   Surgical margins: Negative  Pretreatment PSA: 8.3  Pretreatment SHIM: 22 (He did use Levitra 20 mg preoperatively)    2) Urothelial carcinoma of the bladder: He presented in June 2016 with gross hematuria and a history of storage urinary symptoms. He was found to have a bladder tumor and TURBT revealed carcinoma in situ of the bladder.    Jul 2016: TURBT - CIS of the bladder  Sep-Oct 2016: Induction BCG  Jan 2016: TURBT - high volume, high grade Ta    3) LUTS: He has had chronic urinary frequency even before his bladder cancer diagnosis.  Current treatment: Vesicare 5 mg  Prior treatment: Myrbetriq (stopped due to itching)    Interval history:    Mr. O'Kelly is seen today in follow-up from his recent transurethral resection of his bladder tumor. Pathology indicated high-grade, Ta urothelial carcinoma. Muscularis propria was present in the specimen and was negative for tumor. All visible tumor was resected although this was a fairly substantial size area of the bladder including ureteral bladder neck as well as multiple other areas throughout the bladder. He follows up today to discuss his pathology. He did have a catheter left indwelling for a few days postoperatively and this was removed on Friday. He is voiding frequently as expected. He denies any gross hematuria.   Past Medical History Problems  1. History of Anxiety (F41.9) 2. History of Arthritis 3. History of Bipolar Disorder 4. History of depression (Z86.59) 5. History of heartburn (Z87.898) 6. History of hypercholesterolemia (Z86.39) 7. History of hypothyroidism (Z86.39) 8. History of Intermetatarsal Neuroma 9. History of Left  inguinal hernia (K40.90) 10. History of Malignant neoplasm of overlapping sites of bladder (C67.8) 11. Prostate cancer (C61)  Surgical History Problems  1. History of Appendectomy 2. History of Cataract Surgery 3. History of Cystoscopy With Fulguration Large Lesion (Over 5cm) 4. History of Cystoscopy With Fulguration Large Lesion (Over 5cm) 5. History of Foot Surgery 6. History of Inguinal Hernia Repair 7. History of Prostatect Retropubic Radical W/ Nerve Sparing Laparoscopic 8. History of Tonsillectomy 9. History of Total Knee Arthroplasty  Current Meds 1. Acetaminophen 500 MG Oral Capsule;  Therapy: (Recorded:20Jun2016) to Recorded 2. Furosemide 40 MG Oral Tablet;  Therapy: (Recorded:16Aug2016) to Recorded 3. Gabapentin 300 MG TABS;  Therapy: (Recorded:20Jun2016) to Recorded 4. K-Dur 10 MEQ TBCR;  Therapy: (Recorded:16Aug2016) to Recorded 5. Loratadine 10 MG Oral Tablet;  Therapy: (Recorded:10Sep2009) to Recorded 6. Losartan Potassium 50 MG Oral Tablet;  Therapy: (Recorded:16Aug2016) to Recorded 7. Meloxicam 15 MG Oral Tablet;  Therapy: [id number] to Recorded 8. Natural Fiber Laxative POWD;  Therapy: (Recorded:10Sep2009) to Recorded 9. Omeprazole 20 MG Oral Capsule Delayed Release;  Therapy: [id number] to Recorded 10. Oxybutynin Chloride ER 10 MG Oral Tablet Extended Release 24 Hour; Take 1 tablet   daily;   Therapy: 01Sep2016 to (Evaluate:27Aug2017); Last Rx:01Sep2016 Ordered 11. OxyCODONE HCl - 5 MG Oral Tablet;   Therapy: (Recorded:16Aug2016) to Recorded 12. Oxycodone-Acetaminophen 5-325 MG Oral Tablet;   Therapy: (Recorded:16Aug2016) to Recorded 13. Saline Nasal Spray SOLN;   Therapy: (Recorded:10Sep2009) to Recorded 14. Synthroid 137 MCG Oral Tablet;   Therapy: 27Feb2013 to Recorded 15. Zolpidem Tartrate 10 MG Oral Tablet;  Therapy: (Recorded:10Sep2009) to Recorded  Allergies Medication  1. Myrbetriq TB24 2. Cefdinir CAPS Non-Medication  3. Dust 4.  Mold  Family History Problems  1. Family history of Malignant Melanoma Of The Skin : Father 2. Denied: Family history of Prostate Cancer  Social History Problems  1. Denied: History of Alcohol Use (History) 2. Never A Smoker 3. Occupation: Retired 69. Denied: History of Tobacco Use  Vitals Vital Signs [Data Includes: Last 1 Day]  Recorded: [id number] 12:28PM  Height: 6 ft 3.5 in Weight: 175 lb  BMI Calculated: 21.58 BSA Calculated: 2.08 Blood Pressure: 137 / 77 Heart Rate: 63  Physical Exam Constitutional: Well nourished and well developed . No acute distress.  Pulmonary: No respiratory distress and normal respiratory rhythm and effort.  Cardiovascular: Heart rate and rhythm are normal . No peripheral edema.    Results/Data Urine [Data Includes: Last 1 Day]   [id number]  COLOR YELLOW   APPEARANCE CLEAR   SPECIFIC GRAVITY 1.010   pH 5.5   GLUCOSE NEGATIVE   BILIRUBIN NEGATIVE   KETONE NEGATIVE   BLOOD 3+   PROTEIN NEGATIVE   NITRITE NEGATIVE   LEUKOCYTE ESTERASE TRACE   SQUAMOUS EPITHELIAL/HPF 0-5 HPF  WBC 0-5 WBC/HPF  RBC 3-10 RBC/HPF  BACTERIA NONE SEEN HPF  CRYSTALS NONE SEEN HPF  CASTS NONE SEEN LPF  Yeast NONE SEEN HPF   Assessment Assessed  1. History of Malignant neoplasm of overlapping sites of bladder (C67.8) 2. Malignant neoplasm of overlapping sites of bladder (C67.8) 3. Carcinoma in situ of bladder (D09.0)  Plan Health Maintenance  1. UA With REFLEX; [Do Not Release]; Status:Complete;   [id number] 12:19PM Malignant neoplasm of overlapping sites of bladder  2. Follow-up Office  Follow-up  Status: Hold For - Appointment,Date of Service  Requested  for: [id number]  Discussion/Summary 1. High-risk non-muscle invasive urothelial carcinoma of the bladder: I reviewed his pathology indicating high-grade, Ta urothelial carcinoma. Considering his recent diagnosis of CIS and having completed induction BCG, there is certainly concerned that he may have  BCG refractory disease. However, it also may be that he had inadequate initial treatment and still may be responsive to intravesical therapy. I have recommended that we proceed with a repeat staging TUR in the next few weeks to ensure that all visible tumor is resected. Assuming that his disease state remains similar, I likely will then recommend a repeat induction course of intravesical BCG at that time. He understands that if he truly declares himself as BCG refractory that he may need to consider more aggressive therapy such as radical cystectomy although he has been somewhat resistant to this idea. We have reviewed proceeding with a repeat transurethral resection of bladder tumor. We have again discussed the potential risks and complications as well as the expected recovery process. Mr. Michel Bickers did specifically ask about the possibility of bladder perforation we did discuss this as a risk of the procedure albeit unlikely.    2. Prostate cancer: He is due for PSA surveillance around April 2017.    3. LUTS: Continue Vesicare 5 mg daily.    Cc: Dr. Burnard Bunting   A total of 35 minutes were spent in the overall care of the patient today1  with 35 minutes in direct face to face consultation.1      1 Amended By: Raynelle Bring; Nov 23 2015 5:59 PM EST  Signatures Electronically signed by : Raynelle Bring, M.D.; Nov 23 2015  5:59PM EST

## 2015-12-19 ENCOUNTER — Encounter (HOSPITAL_COMMUNITY): Payer: Self-pay | Admitting: *Deleted

## 2015-12-19 ENCOUNTER — Ambulatory Visit (HOSPITAL_COMMUNITY): Payer: Medicare Other | Admitting: Anesthesiology

## 2015-12-19 ENCOUNTER — Ambulatory Visit (HOSPITAL_COMMUNITY)
Admission: RE | Admit: 2015-12-19 | Discharge: 2015-12-19 | Disposition: A | Discharge status: Home / Self Care | Payer: Medicare Other | Source: Ambulatory Visit | Attending: Urology | Admitting: Urology

## 2015-12-19 ENCOUNTER — Encounter (HOSPITAL_COMMUNITY)
Admission: RE | Disposition: A | Discharge status: Home / Self Care | Payer: Self-pay | Source: Ambulatory Visit | Attending: Urology

## 2015-12-19 DIAGNOSIS — I10 Essential (primary) hypertension: Secondary | ICD-10-CM | POA: Diagnosis not present

## 2015-12-19 DIAGNOSIS — E7800 Pure hypercholesterolemia, unspecified: Secondary | ICD-10-CM | POA: Diagnosis not present

## 2015-12-19 DIAGNOSIS — Z8551 Personal history of malignant neoplasm of bladder: Secondary | ICD-10-CM | POA: Insufficient documentation

## 2015-12-19 DIAGNOSIS — Z79891 Long term (current) use of opiate analgesic: Secondary | ICD-10-CM | POA: Insufficient documentation

## 2015-12-19 DIAGNOSIS — D090 Carcinoma in situ of bladder: Secondary | ICD-10-CM | POA: Diagnosis not present

## 2015-12-19 DIAGNOSIS — Z79899 Other long term (current) drug therapy: Secondary | ICD-10-CM | POA: Diagnosis not present

## 2015-12-19 DIAGNOSIS — K219 Gastro-esophageal reflux disease without esophagitis: Secondary | ICD-10-CM | POA: Diagnosis not present

## 2015-12-19 DIAGNOSIS — E039 Hypothyroidism, unspecified: Secondary | ICD-10-CM | POA: Diagnosis not present

## 2015-12-19 DIAGNOSIS — C678 Malignant neoplasm of overlapping sites of bladder: Secondary | ICD-10-CM | POA: Insufficient documentation

## 2015-12-19 DIAGNOSIS — Z8546 Personal history of malignant neoplasm of prostate: Secondary | ICD-10-CM | POA: Diagnosis not present

## 2015-12-19 DIAGNOSIS — Z791 Long term (current) use of non-steroidal anti-inflammatories (NSAID): Secondary | ICD-10-CM | POA: Diagnosis not present

## 2015-12-19 DIAGNOSIS — C679 Malignant neoplasm of bladder, unspecified: Secondary | ICD-10-CM | POA: Diagnosis not present

## 2015-12-19 DIAGNOSIS — M1990 Unspecified osteoarthritis, unspecified site: Secondary | ICD-10-CM | POA: Insufficient documentation

## 2015-12-19 DIAGNOSIS — Z08 Encounter for follow-up examination after completed treatment for malignant neoplasm: Secondary | ICD-10-CM | POA: Diagnosis present

## 2015-12-19 HISTORY — PX: CYSTOSCOPY: SHX5120

## 2015-12-19 HISTORY — PX: TRANSURETHRAL RESECTION OF BLADDER TUMOR: SHX2575

## 2015-12-19 SURGERY — CYSTOSCOPY
Anesthesia: General

## 2015-12-19 MED ORDER — BELLADONNA ALKALOIDS-OPIUM 16.2-60 MG RE SUPP
RECTAL | Status: AC
  Filled 2015-12-19: qty 1

## 2015-12-19 MED ORDER — FENTANYL CITRATE (PF) 100 MCG/2ML IJ SOLN
25.0000 ug | INTRAMUSCULAR | Status: DC | PRN
  Administered 2015-12-19 (×2): 50 ug via INTRAVENOUS

## 2015-12-19 MED ORDER — BELLADONNA ALKALOIDS-OPIUM 16.2-60 MG RE SUPP
RECTAL | Status: DC | PRN
  Administered 2015-12-19: 1 via RECTAL

## 2015-12-19 MED ORDER — FENTANYL CITRATE (PF) 100 MCG/2ML IJ SOLN
INTRAMUSCULAR | Status: DC | PRN
  Administered 2015-12-19: 100 ug via INTRAVENOUS

## 2015-12-19 MED ORDER — DEXAMETHASONE SODIUM PHOSPHATE 10 MG/ML IJ SOLN
INTRAMUSCULAR | Status: AC
  Filled 2015-12-19: qty 1

## 2015-12-19 MED ORDER — PHENAZOPYRIDINE HCL 100 MG PO TABS: 100.0000 mg | ORAL_TABLET | Freq: Three times a day (TID) | ORAL | Status: DC | PRN

## 2015-12-19 MED ORDER — LIDOCAINE HCL 2 % EX GEL
CUTANEOUS | Status: AC
  Filled 2015-12-19: qty 5

## 2015-12-19 MED ORDER — SUCCINYLCHOLINE CHLORIDE 20 MG/ML IJ SOLN
INTRAMUSCULAR | Status: DC | PRN
  Administered 2015-12-19: 100 mg via INTRAVENOUS

## 2015-12-19 MED ORDER — OXYCODONE-ACETAMINOPHEN 5-325 MG PO TABS: 1.0000 | ORAL_TABLET | ORAL | Status: DC | PRN

## 2015-12-19 MED ORDER — LIDOCAINE HCL 2 % EX GEL
CUTANEOUS | Status: DC | PRN
  Administered 2015-12-19: 1

## 2015-12-19 MED ORDER — PROMETHAZINE HCL 25 MG/ML IJ SOLN: 6.2500 mg | INTRAMUSCULAR | Status: DC | PRN

## 2015-12-19 MED ORDER — LACTATED RINGERS IV SOLN
INTRAVENOUS | Status: DC
  Administered 2015-12-19: 1000 mL via INTRAVENOUS

## 2015-12-19 MED ORDER — PROPOFOL 10 MG/ML IV BOLUS
INTRAVENOUS | Status: AC
  Filled 2015-12-19: qty 20

## 2015-12-19 MED ORDER — EPHEDRINE SULFATE 50 MG/ML IJ SOLN
INTRAMUSCULAR | Status: DC | PRN
  Administered 2015-12-19: 5 mg via INTRAVENOUS

## 2015-12-19 MED ORDER — LIDOCAINE HCL (CARDIAC) 20 MG/ML IV SOLN
INTRAVENOUS | Status: AC
  Filled 2015-12-19: qty 5

## 2015-12-19 MED ORDER — 0.9 % SODIUM CHLORIDE (POUR BTL) OPTIME
TOPICAL | Status: DC | PRN
  Administered 2015-12-19: 1000 mL

## 2015-12-19 MED ORDER — LIDOCAINE HCL (PF) 2 % IJ SOLN
INTRAMUSCULAR | Status: DC | PRN
Start: 2015-12-19 — End: 2015-12-19
  Administered 2015-12-19: 75 mg via INTRADERMAL

## 2015-12-19 MED ORDER — CIPROFLOXACIN IN D5W 400 MG/200ML IV SOLN
INTRAVENOUS | Status: AC
  Filled 2015-12-19: qty 200

## 2015-12-19 MED ORDER — FENTANYL CITRATE (PF) 100 MCG/2ML IJ SOLN
INTRAMUSCULAR | Status: AC
  Filled 2015-12-19: qty 2

## 2015-12-19 MED ORDER — SODIUM CHLORIDE 0.9 % IR SOLN
Status: DC | PRN
  Administered 2015-12-19: 4000 mL

## 2015-12-19 MED ORDER — PROPOFOL 10 MG/ML IV BOLUS
INTRAVENOUS | Status: DC | PRN
  Administered 2015-12-19: 180 mg via INTRAVENOUS

## 2015-12-19 MED ORDER — ROCURONIUM BROMIDE 100 MG/10ML IV SOLN
INTRAVENOUS | Status: DC | PRN
  Administered 2015-12-19: 30 mg via INTRAVENOUS
  Administered 2015-12-19: 10 mg via INTRAVENOUS

## 2015-12-19 MED ORDER — SUGAMMADEX SODIUM 200 MG/2ML IV SOLN
INTRAVENOUS | Status: AC
  Filled 2015-12-19: qty 2

## 2015-12-19 MED ORDER — CIPROFLOXACIN IN D5W 400 MG/200ML IV SOLN
400.0000 mg | INTRAVENOUS | Status: AC
  Administered 2015-12-19: 400 mg via INTRAVENOUS

## 2015-12-19 MED ORDER — SUGAMMADEX SODIUM 200 MG/2ML IV SOLN
INTRAVENOUS | Status: DC | PRN
  Administered 2015-12-19: 200 mg via INTRAVENOUS

## 2015-12-19 MED ORDER — ROCURONIUM BROMIDE 100 MG/10ML IV SOLN
INTRAVENOUS | Status: AC
  Filled 2015-12-19: qty 1

## 2015-12-19 MED ORDER — ONDANSETRON HCL 4 MG/2ML IJ SOLN
INTRAMUSCULAR | Status: AC
  Filled 2015-12-19: qty 2

## 2015-12-19 MED ORDER — DEXAMETHASONE SODIUM PHOSPHATE 10 MG/ML IJ SOLN
INTRAMUSCULAR | Status: DC | PRN
  Administered 2015-12-19: 10 mg via INTRAVENOUS

## 2015-12-19 MED ORDER — ONDANSETRON HCL 4 MG/2ML IJ SOLN
INTRAMUSCULAR | Status: DC | PRN
  Administered 2015-12-19: 4 mg via INTRAVENOUS

## 2015-12-19 SURGICAL SUPPLY — 18 items
BAG URINE DRAINAGE (UROLOGICAL SUPPLIES) IMPLANT
BAG URO CATCHER STRL LF (MISCELLANEOUS) ×3 IMPLANT
CATH INTERMIT  6FR 70CM (CATHETERS) IMPLANT
CLOTH BEACON ORANGE TIMEOUT ST (SAFETY) ×3 IMPLANT
ELECT REM PT RETURN 9FT ADLT (ELECTROSURGICAL) ×3
ELECTRODE REM PT RTRN 9FT ADLT (ELECTROSURGICAL) ×1 IMPLANT
EVACUATOR MICROVAS BLADDER (UROLOGICAL SUPPLIES) IMPLANT
GLOVE BIOGEL M STRL SZ7.5 (GLOVE) ×3 IMPLANT
GOWN STRL REUS W/TWL LRG LVL3 (GOWN DISPOSABLE) ×6 IMPLANT
GUIDEWIRE STR DUAL SENSOR (WIRE) IMPLANT
KIT ASPIRATION TUBING (SET/KITS/TRAYS/PACK) IMPLANT
LOOP CUT BIPOLAR 24F LRG (ELECTROSURGICAL) ×3 IMPLANT
MANIFOLD NEPTUNE II (INSTRUMENTS) ×3 IMPLANT
PACK CYSTO (CUSTOM PROCEDURE TRAY) ×3 IMPLANT
SYRINGE IRR TOOMEY STRL 70CC (SYRINGE) ×3 IMPLANT
TUBING CONNECTING 10 (TUBING) ×2 IMPLANT
TUBING CONNECTING 10' (TUBING) ×1
TUBING INSUFFLATION 10FT LAP (TUBING) IMPLANT

## 2015-12-19 NOTE — Transfer of Care (Signed)
Immediate Anesthesia Transfer of Care Note  Patient: Ronald Lowery  Procedure(s) Performed: Procedure(s): CYSTOSCOPY (N/A) TRANSURETHRAL RESECTION OF BLADDER TUMOR (TURBT) (N/A)  Patient Location: PACU  Anesthesia Type:General  Level of Consciousness: awake, alert  and patient cooperative  Airway & Oxygen Therapy: Patient Spontanous Breathing and Patient connected to face mask oxygen  Post-op Assessment: Report given to RN and Post -op Vital signs reviewed and stable  Post vital signs: Reviewed and stable  Last Vitals:  Filed Vitals:   12/19/15 1340  BP: 130/74  Pulse: 60  Temp: 36.3 C  Resp: 18    Complications: No apparent anesthesia complications

## 2015-12-19 NOTE — Anesthesia Preprocedure Evaluation (Signed)
Anesthesia Evaluation  Patient identified by MRN, date of birth, ID band Patient awake    Reviewed: Allergy & Precautions, NPO status , Patient's Chart, lab work & pertinent test results  History of Anesthesia Complications (+) PROLONGED EMERGENCE and history of anesthetic complications  Airway Mallampati: II  TM Distance: >3 FB Neck ROM: Full    Dental no notable dental hx.    Pulmonary neg pulmonary ROS,    Pulmonary exam normal breath sounds clear to auscultation       Cardiovascular Exercise Tolerance: Good hypertension, Pt. on medications Normal cardiovascular exam Rhythm:Regular Rate:Normal     Neuro/Psych Anxiety Depression negative neurological ROS  negative psych ROS   GI/Hepatic Neg liver ROS, GERD  Medicated,  Endo/Other  Hypothyroidism   Renal/GU negative Renal ROS  negative genitourinary   Musculoskeletal  (+) Arthritis ,   Abdominal   Peds negative pediatric ROS (+)  Hematology negative hematology ROS (+)   Anesthesia Other Findings   Reproductive/Obstetrics negative OB ROS                             Anesthesia Physical  Anesthesia Plan  ASA: II  Anesthesia Plan: General   Post-op Pain Management:    Induction: Intravenous  Airway Management Planned: Oral ETT  Additional Equipment:   Intra-op Plan:   Post-operative Plan: Extubation in OR  Informed Consent: I have reviewed the patients History and Physical, chart, labs and discussed the procedure including the risks, benefits and alternatives for the proposed anesthesia with the patient or authorized representative who has indicated his/her understanding and acceptance.   Dental advisory given  Plan Discussed with: CRNA  Anesthesia Plan Comments: (Previously needed muscle relaxation with ETT. Grade II view)        Anesthesia Quick Evaluation

## 2015-12-19 NOTE — Interval H&P Note (Signed)
History and Physical Interval Note:  12/19/2015 3:06 PM  Ronald Lowery  has presented today for surgery, with the diagnosis of BLADDER CANCER  The various methods of treatment have been discussed with the patient and family. After consideration of risks, benefits and other options for treatment, the patient has consented to  Procedure(s): CYSTOSCOPY (N/A) TRANSURETHRAL RESECTION OF BLADDER TUMOR (TURBT) (N/A) as a surgical intervention .  The patient's history has been reviewed, patient examined, no change in status, stable for surgery.  I have reviewed the patient's chart and labs.  Questions were answered to the patient's satisfaction.     Alphonso Gregson,[name]

## 2015-12-19 NOTE — Discharge Instructions (Addendum)
1. You may see some blood in the urine and may have some burning with urination for 48-72 hours. You also may notice that you have to urinate more frequently or urgently after your procedure which is normal.  °2. You should call should you develop an inability urinate, fever > 101, persistent nausea and vomiting that prevents you from eating or drinking to stay hydrated.  °

## 2015-12-19 NOTE — Anesthesia Procedure Notes (Addendum)
Procedure Name: Intubation Date/Time: 12/19/2015 3:33 PM Performed by: Lollie Sails Pre-anesthesia Checklist: Patient identified, Emergency Drugs available, Suction available, Patient being monitored and Timeout performed Patient Re-evaluated:Patient Re-evaluated prior to inductionOxygen Delivery Method: Circle system utilized Preoxygenation: Pre-oxygenation with 100% oxygen Intubation Type: IV induction Ventilation: Two handed mask ventilation required Laryngoscope Size: Miller and 3 Grade View: Grade I Tube type: Oral Tube size: 7.5 mm Number of attempts: 1 Airway Equipment and Method: Stylet Placement Confirmation: ETT inserted through vocal cords under direct vision,  positive ETCO2 and breath sounds checked- equal and bilateral Secured at: 23 cm Tube secured with: Tape Dental Injury: Teeth and Oropharynx as per pre-operative assessment  Comments: Two handed mask ventilation required to maintain good seal due to patient's beard.

## 2015-12-19 NOTE — Op Note (Signed)
Preoperative diagnosis: 1. Bladder tumor (> 5 cm)  Postoperative diagnosis:  1. Bladder tumor (> 5 cm)  Procedure:  1. Cystoscopy 2. Restaging transurethral resection of bladder tumor (large > 5 cm)  Surgeon: Roxy Horseman, Brooke Bonito. M.D.  Anesthesia: General  Complications: None  Intraoperative findings:  1. Bladder tumor: There was noted to be raised erythematous and what appeared to be probable papillary tumor around the bladder neck extending from the left lateral bladder wall and across the trigone encompassing an area greater than 5 cm.  EBL: Minimal  Specimens: 1. Restaging transurethral bladder tumor resection.  Disposition of specimens: Pathology  Indication: Ronald Lowery is a patient with a history of high risk non-muscle invasive urothelial carcinoma of the bladder. He recently underwent transurethral resection of a large bladder tumor surrounding the bladder neck with concerned that the resection may have possibly understaged his tumor. After reviewing the management options for treatment, he elected to proceed with the above surgical procedure(s). We have discussed the potential benefits and risks of the procedure, side effects of the proposed treatment, the likelihood of the patient achieving the goals of the procedure, and any potential problems that might occur during the procedure or recuperation. Informed consent has been obtained.  Description of procedure:  The patient was taken to the operating room and general anesthesia was induced.  The patient was placed in the dorsal lithotomy position, prepped and draped in the usual sterile fashion, and preoperative antibiotics were administered. A preoperative time-out was performed.   Cystourethroscopy was performed.  The patient's urethra was examined. No abnormalities were noted.  The prostatic urethra was surgically absent.   The bladder was then systematically examined in its entirety. Both a 30 and 70 lens were  used to evaluate the bladder.  Inspection revealed an erythematous and raised area of potential papillary tumor surrounding the bladder neck and extending along the trigone, left lateral bladder wall, and circumferentially around the bladder neck itself.  The ureteral orifices were identified and were close to the tumorbut not involved.  The bladder was then re-examined after the resectoscope was placed.  The entire area of potential tumor was then resected utilizing loop cutting resection.  This resulted in an area of well over 5 cm of urothelium from within the bladder neck, left lateral bladder wall, and the trigone being resected. At multiple times during the procedure, I reinspected the bladder neck with a 70 lens ensuring that all abnormal urothelium was visibly removed.  Hemostasis was then achieved with the loop cautery and the bladder was emptied and reinspected with no further bleeding noted at the end of the procedure.    The bladder was then emptied and the procedure ended.  The patient appeared to tolerate the procedure well and without complications.  The patient was able to be awakened and transferred to the recovery unit in satisfactory condition.    [name] MD

## 2015-12-20 ENCOUNTER — Encounter (HOSPITAL_COMMUNITY): Payer: Self-pay | Admitting: Urology

## 2015-12-21 NOTE — Anesthesia Postprocedure Evaluation (Signed)
Anesthesia Post Note  Patient: Ronald Lowery  Procedure(s) Performed: Procedure(s) (LRB): CYSTOSCOPY (N/A) TRANSURETHRAL RESECTION OF BLADDER TUMOR (TURBT) (N/A)  Patient location during evaluation: PACU Anesthesia Type: General Level of consciousness: awake and alert Pain management: pain level controlled Vital Signs Assessment: post-procedure vital signs reviewed and stable Respiratory status: spontaneous breathing, nonlabored ventilation, respiratory function stable and patient connected to nasal cannula oxygen Cardiovascular status: blood pressure returned to baseline and stable Postop Assessment: no signs of nausea or vomiting Anesthetic complications: no    Last Vitals:  Filed Vitals:   12/19/15 1742 12/19/15 1817  BP: 139/72 142/78  Pulse: 60 58  Temp: 36.6 C 36.6 C  Resp: 12 16    Last Pain:  Filed Vitals:   12/20/15 0950  PainSc: 0-No pain                 Tiajuana Amass

## 2015-12-23 ENCOUNTER — Telehealth (HOSPITAL_COMMUNITY): Payer: Self-pay | Admitting: *Deleted

## 2016-01-03 DIAGNOSIS — R350 Frequency of micturition: Secondary | ICD-10-CM | POA: Diagnosis not present

## 2016-01-03 DIAGNOSIS — C678 Malignant neoplasm of overlapping sites of bladder: Secondary | ICD-10-CM | POA: Diagnosis not present

## 2016-01-03 DIAGNOSIS — Z0000 Encounter for general adult medical examination without abnormal findings: Secondary | ICD-10-CM | POA: Diagnosis not present

## 2016-01-03 DIAGNOSIS — D090 Carcinoma in situ of bladder: Secondary | ICD-10-CM | POA: Diagnosis not present

## 2016-01-12 DIAGNOSIS — Z5111 Encounter for antineoplastic chemotherapy: Secondary | ICD-10-CM | POA: Diagnosis not present

## 2016-01-12 DIAGNOSIS — Z0000 Encounter for general adult medical examination without abnormal findings: Secondary | ICD-10-CM | POA: Diagnosis not present

## 2016-01-12 DIAGNOSIS — C678 Malignant neoplasm of overlapping sites of bladder: Secondary | ICD-10-CM | POA: Diagnosis not present

## 2016-01-19 DIAGNOSIS — D090 Carcinoma in situ of bladder: Secondary | ICD-10-CM | POA: Diagnosis not present

## 2016-01-19 DIAGNOSIS — Z0000 Encounter for general adult medical examination without abnormal findings: Secondary | ICD-10-CM | POA: Diagnosis not present

## 2016-01-19 DIAGNOSIS — Z5111 Encounter for antineoplastic chemotherapy: Secondary | ICD-10-CM | POA: Diagnosis not present

## 2016-01-26 DIAGNOSIS — Z5111 Encounter for antineoplastic chemotherapy: Secondary | ICD-10-CM | POA: Diagnosis not present

## 2016-01-26 DIAGNOSIS — C678 Malignant neoplasm of overlapping sites of bladder: Secondary | ICD-10-CM | POA: Diagnosis not present

## 2016-01-26 DIAGNOSIS — Z0000 Encounter for general adult medical examination without abnormal findings: Secondary | ICD-10-CM | POA: Diagnosis not present

## 2016-02-02 DIAGNOSIS — C678 Malignant neoplasm of overlapping sites of bladder: Secondary | ICD-10-CM | POA: Diagnosis not present

## 2016-02-02 DIAGNOSIS — Z5111 Encounter for antineoplastic chemotherapy: Secondary | ICD-10-CM | POA: Diagnosis not present

## 2016-02-02 DIAGNOSIS — Z0000 Encounter for general adult medical examination without abnormal findings: Secondary | ICD-10-CM | POA: Diagnosis not present

## 2016-02-07 DIAGNOSIS — H01001 Unspecified blepharitis right upper eyelid: Secondary | ICD-10-CM | POA: Diagnosis not present

## 2016-02-07 DIAGNOSIS — H04123 Dry eye syndrome of bilateral lacrimal glands: Secondary | ICD-10-CM | POA: Diagnosis not present

## 2016-02-07 DIAGNOSIS — H01004 Unspecified blepharitis left upper eyelid: Secondary | ICD-10-CM | POA: Diagnosis not present

## 2016-02-07 DIAGNOSIS — H01002 Unspecified blepharitis right lower eyelid: Secondary | ICD-10-CM | POA: Diagnosis not present

## 2016-02-09 DIAGNOSIS — Z5111 Encounter for antineoplastic chemotherapy: Secondary | ICD-10-CM | POA: Diagnosis not present

## 2016-02-09 DIAGNOSIS — C678 Malignant neoplasm of overlapping sites of bladder: Secondary | ICD-10-CM | POA: Diagnosis not present

## 2016-02-09 DIAGNOSIS — Z0000 Encounter for general adult medical examination without abnormal findings: Secondary | ICD-10-CM | POA: Diagnosis not present

## 2016-02-16 DIAGNOSIS — Z0000 Encounter for general adult medical examination without abnormal findings: Secondary | ICD-10-CM | POA: Diagnosis not present

## 2016-02-16 DIAGNOSIS — C678 Malignant neoplasm of overlapping sites of bladder: Secondary | ICD-10-CM | POA: Diagnosis not present

## 2016-02-16 DIAGNOSIS — Z5111 Encounter for antineoplastic chemotherapy: Secondary | ICD-10-CM | POA: Diagnosis not present

## 2016-04-13 DIAGNOSIS — C61 Malignant neoplasm of prostate: Secondary | ICD-10-CM | POA: Diagnosis not present

## 2016-04-20 DIAGNOSIS — C61 Malignant neoplasm of prostate: Secondary | ICD-10-CM | POA: Diagnosis not present

## 2016-04-20 DIAGNOSIS — C678 Malignant neoplasm of overlapping sites of bladder: Secondary | ICD-10-CM | POA: Diagnosis not present

## 2016-04-23 ENCOUNTER — Other Ambulatory Visit: Payer: Self-pay | Admitting: Urology

## 2016-04-26 NOTE — Patient Instructions (Addendum)
Ronald Lowery  04/26/2016   Your procedure is scheduled on: 05-04-16  Report to [facility] Main  Entrance take [facility]  elevators to 3rd floor to  [place] at 1100  AM.  Call this number if you have problems the morning of surgery [phone]   Remember: ONLY 1 PERSON MAY GO WITH YOU TO SHORT STAY TO GET  READY MORNING OF [place].  Do not eat food  :After Midnight, may have clear ;iquids from midnight until 700 am day of surgery, no clear liquids or anything by mouth after 700 am day of surgery.     Take these medicines the morning of surgery with A SIP OF WATER: LEVOTHYROXINE (SYNTHROID), OMEPRAZOLE (PRILOSEC), CLARITIN                               You may not have any metal on your body including hair pins and              piercings  Do not wear jewelry, make-up, lotions, powders or perfumes, deodorant             Do not wear nail polish.  Do not shave  48 hours prior to surgery.              Men may shave face and neck.   Do not bring valuables to the hospital. [place].  Contacts, dentures or bridgework may not be worn into surgery.  Leave suitcase in the car. After surgery it may be brought to your room.     Patients discharged the day of surgery will not be allowed to drive home.  Name and phone number of your driver: Steward Drone (wife) [phone] (m)  Special Instructions: N/A              Please read over the following fact sheets you were given: _____________________________________________________________________                CLEAR LIQUID DIET   Foods Allowed                                                                     Foods Excluded  Coffee and tea, regular and decaf                             liquids that you cannot  Plain Jell-O in any flavor                                             see through such as: Fruit ices (not with fruit pulp)                                      milk, soups, orange juice  Iced Popsicles                                    All solid food Carbonated beverages, regular and diet                                    Cranberry, grape and apple juices Sports drinks like Gatorade Lightly seasoned clear broth or consume(fat free) Sugar, honey syrup  Sample Menu Breakfast                                Lunch                                     Supper Cranberry juice                    Beef broth                            Chicken broth Jell-O                                     Grape juice                           Apple juice Coffee or tea                        Jell-O                                      Popsicle                                                Coffee or tea                        Coffee or tea  _____________________________________________________________________  [facility] - Preparing for Surgery Before surgery, you can play an important role.  Because skin is not sterile, your skin needs to be as free of germs as possible.  You can reduce the number of germs on your skin by washing with CHG (chlorahexidine gluconate) soap before surgery.  CHG is an antiseptic cleaner which kills germs and bonds with the skin to continue killing germs even after washing. Please DO NOT use if you have an allergy to CHG or antibacterial soaps.  If your skin becomes reddened/irritated stop using the CHG and inform your nurse when you arrive at Short Stay. Do not shave (including legs and underarms) for at least 48 hours prior to the first CHG shower.  You may shave your face/neck. Please follow these instructions carefully:  1.  Shower with CHG Soap the night before surgery and the  morning of Surgery.  2.  If you choose to wash your hair, wash your hair  first as usual with your  normal  shampoo.  3.  After you shampoo, rinse your hair and body thoroughly to remove the  shampoo.                           4.  Use CHG as you would any  other liquid soap.  You can apply chg directly  to the skin and wash                       Gently with a scrungie or clean washcloth.  5.  Apply the CHG Soap to your body ONLY FROM THE NECK DOWN.   Do not use on face/ open                           Wound or open sores. Avoid contact with eyes, ears mouth and genitals (private parts).                       Wash face,  Genitals (private parts) with your normal soap.             6.  Wash thoroughly, paying special attention to the area where your surgery  will be performed.  7.  Thoroughly rinse your body with warm water from the neck down.  8.  DO NOT shower/wash with your normal soap after using and rinsing off  the CHG Soap.                9.  Pat yourself dry with a clean towel.            10.  Wear clean pajamas.            11.  Place clean sheets on your bed the night of your first shower and do not  sleep with pets. Day of Surgery : Do not apply any lotions/deodorants the morning of surgery.  Please wear clean clothes to the hospital/surgery center.  FAILURE TO FOLLOW THESE INSTRUCTIONS MAY RESULT IN THE CANCELLATION OF YOUR SURGERY PATIENT SIGNATURE_________________________________  NURSE SIGNATURE__________________________________  ________________________________________________________________________

## 2016-04-27 ENCOUNTER — Encounter (HOSPITAL_COMMUNITY)
Admission: RE | Admit: 2016-04-27 | Discharge: 2016-04-27 | Disposition: A | Discharge status: Home / Self Care | Payer: Medicare Other | Source: Ambulatory Visit | Attending: Urology | Admitting: Urology

## 2016-04-27 ENCOUNTER — Encounter (HOSPITAL_COMMUNITY): Payer: Self-pay

## 2016-04-27 DIAGNOSIS — Z01810 Encounter for preprocedural cardiovascular examination: Secondary | ICD-10-CM | POA: Insufficient documentation

## 2016-04-27 DIAGNOSIS — Z01812 Encounter for preprocedural laboratory examination: Secondary | ICD-10-CM | POA: Insufficient documentation

## 2016-04-27 HISTORY — DX: Hyperlipidemia, unspecified: E78.5

## 2016-04-27 HISTORY — DX: Hematuria, unspecified: R31.9

## 2016-04-27 HISTORY — DX: Personal history of other diseases of the digestive system: Z87.19

## 2016-04-27 LAB — BASIC METABOLIC PANEL
ANION GAP: 6 (ref 5–15)
BUN: 8 mg/dL (ref 6–20)
CHLORIDE: 95 mmol/L — AB (ref 101–111)
CO2: 29 mmol/L (ref 22–32)
Calcium: 8.8 mg/dL — ABNORMAL LOW (ref 8.9–10.3)
Creatinine, Ser: 0.86 mg/dL (ref 0.61–1.24)
GFR calc Af Amer: >60 mL/min (ref >60)
GFR calc non Af Amer: >60 mL/min (ref >60)
GLUCOSE: 59 mg/dL — AB (ref 65–99)
POTASSIUM: 4.1 mmol/L (ref 3.5–5.1)
Sodium: 130 mmol/L — ABNORMAL LOW (ref 135–145)

## 2016-04-27 LAB — CBC
HEMATOCRIT: 40.2 % (ref 39.0–52.0)
HEMOGLOBIN: 13.7 g/dL (ref 13.0–17.0)
MCH: 30.6 pg (ref 26.0–34.0)
MCHC: 34.1 g/dL (ref 30.0–36.0)
MCV: 89.9 fL (ref 78.0–100.0)
Platelets: 257 10*3/uL (ref 150–400)
RBC: 4.47 MIL/uL (ref 4.22–5.81)
RDW: 12.5 % (ref 11.5–15.5)
WBC: 6.2 10*3/uL (ref 4.0–10.5)

## 2016-04-27 NOTE — Pre-Procedure Instructions (Signed)
CXR 05-03-15 epic

## 2016-05-01 DIAGNOSIS — Z85828 Personal history of other malignant neoplasm of skin: Secondary | ICD-10-CM | POA: Diagnosis not present

## 2016-05-01 DIAGNOSIS — D225 Melanocytic nevi of trunk: Secondary | ICD-10-CM | POA: Diagnosis not present

## 2016-05-01 DIAGNOSIS — L821 Other seborrheic keratosis: Secondary | ICD-10-CM | POA: Diagnosis not present

## 2016-05-01 DIAGNOSIS — D1801 Hemangioma of skin and subcutaneous tissue: Secondary | ICD-10-CM | POA: Diagnosis not present

## 2016-05-03 NOTE — H&P (Signed)
Office Visit Report     04/20/2016   --------------------------------------------------------------------------------   Ronald Lowery  MRN: [id number]  PRIMARY CARE:  Richard A. [name], MD  DOB: 04-21-1941, 75 year old Male  REFERRING:  Richard A. [name], MD  SSN: -**[id number]  PROVIDER:  Raynelle Bring, M.D.    LOCATION:  [facility] [phone]   --------------------------------------------------------------------------------   CC: Bladder CA cysto f/u  HPI: Ronald Lowery is a 75 year-old male established patient who is here for cystoscopic surveillance of bladder cancer.  He has not noted recent hematuria.   He returns today after completing his second course of induction BCG. He has stable voiding symptoms including urinary frequency and urgency which have been adequately managed with oxybutynin 10 mg ER.     CC: Prostate Cancer  HPI: He is s/p a BNS RAL radical prostatectomy and BPLND for treatment of prostate cancer.   Date of surgery: 10/26/2015.     ALLERGIES: Cefdinir CAPS Dust Mold Myrbetriq TB24    MEDICATIONS: Acetaminophen 500 MG CAPS Oral  Furosemide 40 MG Oral Tablet Oral  Gabapentin 300 MG TABS Oral  K-Dur 10 MEQ TBCR Oral  Loratadine 10 MG Oral Tablet Oral  Losartan Potassium 50 MG Oral Tablet Oral  Natural Fiber Laxative POWD Oral  Omeprazole 20 MG Oral Capsule Delayed Release Oral  Oxybutynin Chloride 5 MG Oral Tablet 0 Oral  Saline Nasal Spray SOLN Nasal  Synthroid 137 MCG Oral Tablet Oral  Zolpidem Tartrate 10 MG Oral Tablet Oral     GU PSH: Cystoscopy TURBT >5 cm - 01/02/2016, 11/17/2015, 05/19/2015 Robotic Radical Prostatectomy - 2010      PSH Notes: Cystoscopy With Fulguration Large Lesion (Over 5cm), Cystoscopy With Fulguration Large Lesion (Over 5cm), Total Knee Arthroplasty, Cataract Surgery, Cystoscopy With Fulguration Large Lesion (Over 5cm), Inguinal Hernia Repair, Prostatect Retropubic Radical W/ Nerve Sparing Laparoscopic,  Tonsillectomy, Appendectomy, Foot Surgery   NON-GU PSH: Appendectomy - 2009 Remove Tonsils - 2009 Total Knee Replacement - 10/28/2015    GU PMH: Bladder Cancer, overlapping sites, Malignant neoplasm of overlapping sites of bladder - 12/26/2015, Malignant neoplasm of overlapping sites of bladder, - 11/17/2015 Ca In Situ Bladder, Carcinoma in situ of bladder - 12/26/2015 Urinary Frequency, Increased urinary frequency - 12/26/2015 Prostate Cancer, Prostate cancer - 10/19/2015 Gross hematuria, Gross hematuria - 04/05/2015 Male ED, unspecified, Erectile dysfunction - 01/26/2015 Unil Inguinal Hernia W/O obst or gang,non-recurrent, Left inguinal hernia - 09/22/2014      PMH Notes:   1) Prostate cancer: He is s/p a BNS RAL radical prostatectomy on October 25, 2008. His PSA has been undetectable since treatment.   TNM stage: pT2c N0 Mx  Gleason score: 3+4=7  Surgical margins: Negative  Pretreatment PSA: 8.3  Pretreatment SHIM: 22 (He did use Levitra 20 mg preoperatively)   2) Urothelial carcinoma of the bladder: He presented in June 2016 with gross hematuria and a history of storage urinary symptoms. He was found to have a bladder tumor and TURBT revealed carcinoma in situ of the bladder.   Jul 2016: TURBT - CIS of the bladder  Sep-Oct 2016: Induction BCG  Jan 2017: TURBT - high volume, high grade Ta  Mar 2017: Restaging TURBT - CIS (all visible tumor removed)  Apr-May 2017: Repeat induction BCG    3) LUTS: He has had chronic urinary frequency even before his bladder cancer diagnosis.   Current treatment: Vesicare 5 mg  Prior treatment: Myrbetriq (stopped due to itching)   NON-GU PMH: Anxiety disorder,  unspecified, Anxiety - 2014 Personal history of other endocrine, nutritional and metabolic disease, History of hypothyroidism - 2014, History of hypercholesterolemia, - 2014 Personal history of other mental and behavioral disorders, History of depression - 2014 Personal history of other  specified conditions, History of heartburn - 2014 , Intermetatarsal Neuroma - 2009, Bipolar Disorder, - 2009, Arthritis, - 2009    FAMILY HISTORY: Malignant Melanoma Of The Skin - Father Prostate Cancer - Runs In Family   SOCIAL HISTORY: Marital Status: Married Current Smoking Status: Patient has never smoked.  Social Drinker.      Notes: Never A Smoker, Occupation: Retired, Alcohol Use, Tobacco Use   REVIEW OF SYSTEMS:    GU Review Male:   Patient reports frequent urination, hard to postpone urination, get up at night to urinate, leakage of urine, and stream starts and stops. Patient denies burning/ pain with urination, trouble starting your streams, and have to strain to urinate .  Gastrointestinal (Lower):   Patient denies diarrhea and constipation.  Gastrointestinal (Upper):   Patient denies nausea and vomiting.  Constitutional:   Patient reports fatigue. Patient denies fever, night sweats, and weight loss.  Skin:   Patient denies skin rash/ lesion and itching.  Eyes:   Patient denies blurred vision and double vision.  Ears/ Nose/ Throat:   Patient denies sore throat and sinus problems.  Hematologic/Lymphatic:   Patient denies swollen glands and easy bruising.  Cardiovascular:   Patient denies leg swelling and chest pains.  Respiratory:   Patient denies cough and shortness of breath.  Endocrine:   Patient reports excessive thirst.   Musculoskeletal:   Patient reports back pain. Patient denies joint pain.  Neurological:   Patient denies headaches and dizziness.  Psychologic:   Patient denies depression and anxiety.   VITAL SIGNS: None   GU PHYSICAL EXAMINATION:    Urethral Meatus: Normal size. No lesion, no wart, no polyp, no balanitis, no discharge. Normal location.    MULTI-SYSTEM PHYSICAL EXAMINATION:    Constitutional: Well-nourished. No physical deformities. Normally developed. Good grooming.     PAST DATA REVIEWED:  Source Of History:  Patient  Lab Test Review:   PSA   Urine Test Review:   Urinalysis   04/13/16  PSA  Total PSA < 0.01     PROCEDURES:         Flexible Cystoscopy - 52000  Indication:  Risks, benefits, and potential complications of the procedure were discussed with the patient including infection, bleeding, voiding discomfort, urinary retention, fever, chills, sepsis, and others. All questions were answered. Informed consent was obtained. Sterile technique and intraurethral analgesia were used.  Meatus:  Normal size. Normal location. Normal condition.  Urethra:  No strictures.  External Sphincter:  Normal.  Verumontanum:  Verumontanum Surgically Absent.  Prostate:  Prostate Surgically Absent.  Bladder Neck:  Non-obstructing.  Ureteral Orifices:  Normal location. Normal size. Normal shape. Effluxed clear urine.  Bladder:  Systematic examination of the bladder mucosa reveals 2 small less than 1 cm areas of erythema and possible early tumor recurrence toward the posterior bladder and dome. More concerning is that there are 2 areas noted anteriorly at 12:00 and especially around the bladder neck that really significant concern for recurrent high-grade disease or carcinoma in situ. Bladder washing was obtained for cytology.      Chaperone: The procedure was well-tolerated and without complications. Instructions were given to call the office immediately if questions or problems.         Urinalysis w/Scope -  81001 Dipstick Dipstick Cont'd Micro  Specimen: Voided Bilirubin: Neg WBC/hpf: NS (Not Seen)  Color: Straw Ketones: Neg RBC/hpf: 0-2/hpf  Appearance: Clear Blood: Trace Intact Bacteria: NS (Not Seen)  Specific Gravity: <= 1.005 Protein: Neg Cystals: NS (Not Seen)  pH: 6.0 Urobilinogen: 0.2 Casts: NS (Not Seen)  Glucose: Neg Nitrites: Neg Trichomonas: Not Present    Leukocyte Esterase: Neg Mucous: Not Present      Epithelial Cells: NS (Not Seen)      Yeast: NS (Not Seen)      Sperm: Not Present    ASSESSMENT:      ICD-10 Details   1 GU:   Bladder Cancer, overlapping sites - C67.8   2   Prostate Cancer - C61    PLAN:           Orders Labs Urine Cytology  Lab Notes: Bladder washing          Schedule Return Visit: Next Available Appointment - Schedule Surgery          Document Letter(s):  Created for Patient: Clinical Summary         Notes:   1. Prostate cancer: His PSA is undetectable. Continued PSA surveillance in one year.   2. Recurrent high-risk non-muscle invasive bladder cancer: He does have 2 definite areas of recurrent tumor located anteriorly and at the bladder neck that are suspicious for high-grade disease. I recommended that he proceed with cystoscopy, exam under anesthesia, and transurethral resection of bladder tumor, Bilateral retrograde pyelography. We have reviewed the potential risks and complications as well as the expected recovery process associated with the planned procedure.   We discussed that he does appear to have BCG refractory disease based on his very quick recurrence now for a second time. He does have persistent carcinoma in situ, he understands that proceeding with radical cystectomy would be the recommended standard of care treatment. He has been very hesitant to consider this option although we did discuss this in more detail today including associated urinary diversion options. He will give further consideration to this possibility leading up to his endoscopic surgery.   We also discussed the potential increased complexity of radical cystectomy concerns prior history of robotic prostatectomy and lymph node dissection as well as his extensive mesh repair of bilateral inguinal hernias.   3. LUTS: Continue oxybutynin 10 mg ER.   Cc: Dr. Burnard Bunting    * Signed by Raynelle Bring, M.D. on 04/20/16 at 7:16 PM (EDT)*

## 2016-05-04 ENCOUNTER — Ambulatory Visit (HOSPITAL_COMMUNITY): Payer: Medicare Other | Admitting: Registered Nurse

## 2016-05-04 ENCOUNTER — Encounter (HOSPITAL_COMMUNITY): Payer: Self-pay | Admitting: *Deleted

## 2016-05-04 ENCOUNTER — Encounter (HOSPITAL_COMMUNITY)
Admission: RE | Disposition: A | Discharge status: Home / Self Care | Payer: Self-pay | Source: Ambulatory Visit | Attending: Urology

## 2016-05-04 ENCOUNTER — Ambulatory Visit (HOSPITAL_COMMUNITY)
Admission: RE | Admit: 2016-05-04 | Discharge: 2016-05-04 | Disposition: A | Discharge status: Home / Self Care | Payer: Medicare Other | Source: Ambulatory Visit | Attending: Urology | Admitting: Urology

## 2016-05-04 DIAGNOSIS — Z96659 Presence of unspecified artificial knee joint: Secondary | ICD-10-CM | POA: Diagnosis not present

## 2016-05-04 DIAGNOSIS — D090 Carcinoma in situ of bladder: Secondary | ICD-10-CM | POA: Insufficient documentation

## 2016-05-04 DIAGNOSIS — E039 Hypothyroidism, unspecified: Secondary | ICD-10-CM | POA: Insufficient documentation

## 2016-05-04 DIAGNOSIS — Z9079 Acquired absence of other genital organ(s): Secondary | ICD-10-CM | POA: Diagnosis not present

## 2016-05-04 DIAGNOSIS — I10 Essential (primary) hypertension: Secondary | ICD-10-CM | POA: Diagnosis not present

## 2016-05-04 DIAGNOSIS — Z8546 Personal history of malignant neoplasm of prostate: Secondary | ICD-10-CM | POA: Insufficient documentation

## 2016-05-04 DIAGNOSIS — Z79899 Other long term (current) drug therapy: Secondary | ICD-10-CM | POA: Insufficient documentation

## 2016-05-04 DIAGNOSIS — G629 Polyneuropathy, unspecified: Secondary | ICD-10-CM | POA: Diagnosis not present

## 2016-05-04 DIAGNOSIS — C678 Malignant neoplasm of overlapping sites of bladder: Secondary | ICD-10-CM | POA: Diagnosis not present

## 2016-05-04 DIAGNOSIS — C679 Malignant neoplasm of bladder, unspecified: Secondary | ICD-10-CM | POA: Diagnosis not present

## 2016-05-04 DIAGNOSIS — K219 Gastro-esophageal reflux disease without esophagitis: Secondary | ICD-10-CM | POA: Diagnosis not present

## 2016-05-04 HISTORY — PX: CYSTOSCOPY W/ RETROGRADES: SHX1426

## 2016-05-04 HISTORY — PX: TRANSURETHRAL RESECTION OF BLADDER TUMOR: SHX2575

## 2016-05-04 SURGERY — TURBT (TRANSURETHRAL RESECTION OF BLADDER TUMOR)
Anesthesia: General

## 2016-05-04 MED ORDER — HYDROCODONE-ACETAMINOPHEN 5-325 MG PO TABS: 1.0000 | ORAL_TABLET | Freq: Four times a day (QID) | ORAL | Status: DC | PRN

## 2016-05-04 MED ORDER — ONDANSETRON HCL 4 MG/2ML IJ SOLN
INTRAMUSCULAR | Status: AC
  Filled 2016-05-04: qty 2

## 2016-05-04 MED ORDER — LIDOCAINE HCL (CARDIAC) 20 MG/ML IV SOLN
INTRAVENOUS | Status: AC
  Filled 2016-05-04: qty 5

## 2016-05-04 MED ORDER — FENTANYL CITRATE (PF) 100 MCG/2ML IJ SOLN
INTRAMUSCULAR | Status: DC | PRN
  Administered 2016-05-04 (×2): 50 ug via INTRAVENOUS

## 2016-05-04 MED ORDER — CIPROFLOXACIN IN D5W 400 MG/200ML IV SOLN
400.0000 mg | INTRAVENOUS | Status: AC
  Administered 2016-05-04: 400 mg via INTRAVENOUS

## 2016-05-04 MED ORDER — PROPOFOL 10 MG/ML IV BOLUS
INTRAVENOUS | Status: DC | PRN
Start: 2016-05-04 — End: 2016-05-04
  Administered 2016-05-04: 150 mg via INTRAVENOUS

## 2016-05-04 MED ORDER — IOHEXOL 300 MG/ML  SOLN
INTRAMUSCULAR | Status: DC | PRN
  Administered 2016-05-04: 20 mL

## 2016-05-04 MED ORDER — PROPOFOL 10 MG/ML IV BOLUS
INTRAVENOUS | Status: AC
  Filled 2016-05-04: qty 20

## 2016-05-04 MED ORDER — ONDANSETRON HCL 4 MG/2ML IJ SOLN
INTRAMUSCULAR | Status: DC | PRN
  Administered 2016-05-04: 4 mg via INTRAVENOUS

## 2016-05-04 MED ORDER — DEXAMETHASONE SODIUM PHOSPHATE 10 MG/ML IJ SOLN
INTRAMUSCULAR | Status: DC | PRN
  Administered 2016-05-04: 10 mg via INTRAVENOUS

## 2016-05-04 MED ORDER — CIPROFLOXACIN IN D5W 400 MG/200ML IV SOLN
INTRAVENOUS | Status: AC
  Filled 2016-05-04: qty 200

## 2016-05-04 MED ORDER — LACTATED RINGERS IV SOLN
INTRAVENOUS | Status: DC
  Administered 2016-05-04: 13:00:00 via INTRAVENOUS

## 2016-05-04 MED ORDER — DEXAMETHASONE SODIUM PHOSPHATE 10 MG/ML IJ SOLN
INTRAMUSCULAR | Status: AC
  Filled 2016-05-04: qty 1

## 2016-05-04 MED ORDER — 0.9 % SODIUM CHLORIDE (POUR BTL) OPTIME
TOPICAL | Status: DC | PRN
  Administered 2016-05-04: 1000 mL

## 2016-05-04 MED ORDER — PHENAZOPYRIDINE HCL 100 MG PO TABS: 100.0000 mg | ORAL_TABLET | Freq: Three times a day (TID) | ORAL | Status: DC | PRN

## 2016-05-04 MED ORDER — SODIUM CHLORIDE 0.9 % IR SOLN
Status: DC | PRN
  Administered 2016-05-04: 3000 mL

## 2016-05-04 MED ORDER — ONDANSETRON HCL 4 MG/2ML IJ SOLN: 4.0000 mg | Freq: Once | INTRAMUSCULAR | Status: DC | PRN

## 2016-05-04 MED ORDER — EPHEDRINE SULFATE 50 MG/ML IJ SOLN
INTRAMUSCULAR | Status: DC | PRN
  Administered 2016-05-04: 20 mg via INTRAVENOUS

## 2016-05-04 MED ORDER — MEPERIDINE HCL 50 MG/ML IJ SOLN: 6.2500 mg | INTRAMUSCULAR | Status: DC | PRN

## 2016-05-04 MED ORDER — LIDOCAINE 2% (20 MG/ML) 5 ML SYRINGE
INTRAMUSCULAR | Status: DC | PRN
  Administered 2016-05-04: 100 mg via INTRAVENOUS

## 2016-05-04 MED ORDER — FENTANYL CITRATE (PF) 100 MCG/2ML IJ SOLN
INTRAMUSCULAR | Status: AC
Start: 2016-05-04 — End: 2016-05-04
  Filled 2016-05-04: qty 2

## 2016-05-04 MED ORDER — FENTANYL CITRATE (PF) 100 MCG/2ML IJ SOLN: 25.0000 ug | INTRAMUSCULAR | Status: DC | PRN

## 2016-05-04 SURGICAL SUPPLY — 17 items
BAG URINE DRAINAGE (UROLOGICAL SUPPLIES) IMPLANT
BAG URO CATCHER STRL LF (MISCELLANEOUS) ×4 IMPLANT
CATH INTERMIT  6FR 70CM (CATHETERS) ×4 IMPLANT
CLOTH BEACON ORANGE TIMEOUT ST (SAFETY) ×4 IMPLANT
ELECT REM PT RETURN 9FT ADLT (ELECTROSURGICAL) ×4
ELECTRODE REM PT RTRN 9FT ADLT (ELECTROSURGICAL) ×2 IMPLANT
EVACUATOR MICROVAS BLADDER (UROLOGICAL SUPPLIES) IMPLANT
GLOVE BIOGEL M STRL SZ7.5 (GLOVE) ×4 IMPLANT
GOWN STRL REUS W/TWL LRG LVL3 (GOWN DISPOSABLE) ×8 IMPLANT
GUIDEWIRE STR DUAL SENSOR (WIRE) ×4 IMPLANT
LOOP CUT BIPOLAR 24F LRG (ELECTROSURGICAL) ×4 IMPLANT
MANIFOLD NEPTUNE II (INSTRUMENTS) ×4 IMPLANT
PACK CYSTO (CUSTOM PROCEDURE TRAY) ×4 IMPLANT
SET ASPIRATION TUBING (TUBING) IMPLANT
SYRINGE IRR TOOMEY STRL 70CC (SYRINGE) ×4 IMPLANT
TUBING CONNECTING 10 (TUBING) ×3 IMPLANT
TUBING CONNECTING 10' (TUBING) ×1

## 2016-05-04 NOTE — Op Note (Signed)
Preoperative diagnosis: 1. Bladder tumor (2 cm)  Postoperative diagnosis:  1. Bladder tumor (2 cm)  Procedure:  1. Cystoscopy 2. Transurethral resection of bladder tumor (2 cm) 3. Bilateral retrograde pyelography with interpretation 4. Exam under anesthesia  Surgeon: [name]. M.D.  Anesthesia: General  Complications: None  Intraoperative findings:  1. Bladder tumor: There were two small erythematous raised lesions suspicious for tumor located near the dome each measuring < 1 cm.  Another smaller lesion that appeared papillary was located toward the left posterior bladder and was < 1 cm.  There were then two areas of suspicious consistent with velvety tumor on the anterior aspect of the bladder with one area extending to the bladder neck.  These areas each measured about 2 cm. 2. Retrograde pyelography: Bilateral retrograde pyelography was performed with a 6 Fr ureteral catheter and Omnipaque contrast.  There were no filling defects within the ureters or renal collecting system bilaterally.  EBL: Minimal  Specimens: 1. Anterior bladder tumor 2. Left posterior bladder tumor 3. Dome bladder tumor  Disposition of specimens: Pathology  Indication: Ronald Lowery is a patient who has a history of bladder cancer and was found to have a recurrent bladder tumor. After reviewing the management options for treatment, he elected to proceed with the above surgical procedure(s). We have discussed the potential benefits and risks of the procedure, side effects of the proposed treatment, the likelihood of the patient achieving the goals of the procedure, and any potential problems that might occur during the procedure or recuperation. Informed consent has been obtained.  Description of procedure:  The patient was taken to the operating room and general anesthesia was induced.  The patient was placed in the dorsal lithotomy position, prepped and draped in the usual sterile fashion,  and preoperative antibiotics were administered. A preoperative time-out was performed.   Cystourethroscopy was performed.  The patient's urethra was examined and was normal.  The prostatic urethra was surgically absent.   The bladder was then systematically examined in its entirety. As mentioned above there were multiple areas suspicious for tumor including smaller lesions near the dome, one tumor in the left posterior bladder, and two larger areas located anteriorly in the middle of the anterior bladder and another near the bladder neck anteriorly. The ureteral orifices were in their normal expected anatomic location and were effluxing clear urine.  Attention then turned to the right ureteral orifice and a ureteral catheter was used to intubate the ureteral orifice.  Omnipaque contrast was injected through the ureteral catheter and a retrograde pyelogram was performed with findings as dictated above.  Attention then turned to the left ureteral orifice and a ureteral catheter was used to intubate the ureteral orifice.  Omnipaque contrast was injected through the ureteral catheter and a retrograde pyelogram was performed with findings as dictated above.  The bladder was then re-examined after the resectoscope was placed. Using loop bipolar cutting resection, all visibile tumor was resected and removed for permanent pathologic analysis. Care was taken to sample the underlying muscularis propria.    Separate biopsies were also taken of the underlying deep bladder tissue and sent as a separate specimen.   Hemostasis was then achieved with the loop cautery and the bladder was emptied and reinspected with no further bleeding noted at the end of the procedure.    The bladder was then emptied.  An exam under anesthesia was performed.  No pelvic masses were noted and the procedure ended.  The patient  appeared to tolerate the procedure well and without complications.  The patient was able to be awakened and  transferred to the recovery unit in satisfactory condition.    [name] MD

## 2016-05-04 NOTE — Discharge Instructions (Signed)
1. You may see some blood in the urine and may have some burning with urination for 48-72 hours. You also may notice that you have to urinate more frequently or urgently after your procedure which is normal.  °2. You should call should you develop an inability urinate, fever > 101, persistent nausea and vomiting that prevents you from eating or drinking to stay hydrated.  °

## 2016-05-04 NOTE — Interval H&P Note (Signed)
History and Physical Interval Note:  05/04/2016 12:55 PM  Ronald Lowery  has presented today for surgery, with the diagnosis of BLADDER CANCER  The various methods of treatment have been discussed with the patient and family. After consideration of risks, benefits and other options for treatment, the patient has consented to  Procedure(s): TRANSURETHRAL RESECTION OF BLADDER TUMOR (TURBT) (N/A) CYSTOSCOPY WITH RETROGRADE PYELOGRAM (Bilateral) as a surgical intervention .  The patient's history has been reviewed, patient examined, no change in status, stable for surgery.  I have reviewed the patient's chart and labs.  Questions were answered to the patient's satisfaction.     Rhylei Mcquaig,[name]

## 2016-05-04 NOTE — Transfer of Care (Signed)
Immediate Anesthesia Transfer of Care Note  Patient: Ronald Lowery  Procedure(s) Performed: Procedure(s): TRANSURETHRAL RESECTION OF BLADDER TUMOR (TURBT) (N/A) CYSTOSCOPY WITH RETROGRADE PYELOGRAM (Bilateral)  Patient Location: PACU  Anesthesia Type:General  Level of Consciousness:  sedated, patient cooperative and responds to stimulation  Airway & Oxygen Therapy:Patient Spontanous Breathing and Patient connected to face mask oxgen  Post-op Assessment:  Report given to PACU RN and Post -op Vital signs reviewed and stable  Post vital signs:  Reviewed and stable  Last Vitals:  Filed Vitals:   05/04/16 1035  BP: 139/94  Pulse: 82  Temp: 36.7 C  Resp: 18    Complications: No apparent anesthesia complications

## 2016-05-04 NOTE — Anesthesia Preprocedure Evaluation (Addendum)
Anesthesia Evaluation  Patient identified by MRN, date of birth, ID band Patient awake    Reviewed: Allergy & Precautions, NPO status , Patient's Chart, lab work & pertinent test results  History of Anesthesia Complications (+) PROLONGED EMERGENCE and history of anesthetic complications  Airway Mallampati: II  TM Distance: >3 FB Neck ROM: Full    Dental no notable dental hx. (+) Teeth Intact   Pulmonary neg pulmonary ROS,    Pulmonary exam normal breath sounds clear to auscultation       Cardiovascular Exercise Tolerance: Good hypertension, Pt. on medications Normal cardiovascular exam Rhythm:Regular Rate:Normal     Neuro/Psych PSYCHIATRIC DISORDERS Anxiety Depression Neuropathy negative psych ROS   GI/Hepatic Neg liver ROS, hiatal hernia, GERD  Medicated,  Endo/Other  Hypothyroidism Hyponatremia  Renal/GU negative Renal ROS   Bladder Ca    Musculoskeletal  (+) Arthritis , Scoliosis   Abdominal Normal abdominal exam  (+)   Peds negative pediatric ROS (+)  Hematology negative hematology ROS (+)   Anesthesia Other Findings   Reproductive/Obstetrics negative OB ROS                            Anesthesia Physical  Anesthesia Plan  ASA: II  Anesthesia Plan: General   Post-op Pain Management:    Induction: Intravenous  Airway Management Planned: Oral ETT and LMA  Additional Equipment:   Intra-op Plan:   Post-operative Plan: Extubation in OR  Informed Consent: I have reviewed the patients History and Physical, chart, labs and discussed the procedure including the risks, benefits and alternatives for the proposed anesthesia with the patient or authorized representative who has indicated his/her understanding and acceptance.   Dental advisory given  Plan Discussed with: CRNA, Anesthesiologist and Surgeon  Anesthesia Plan Comments: (Previously needed muscle relaxation with ETT.  Grade II view)        Anesthesia Quick Evaluation

## 2016-05-04 NOTE — Anesthesia Postprocedure Evaluation (Signed)
Anesthesia Post Note  Patient: Ronald Lowery  Procedure(s) Performed: Procedure(s) (LRB): TRANSURETHRAL RESECTION OF BLADDER TUMOR (TURBT) (N/A) CYSTOSCOPY WITH RETROGRADE PYELOGRAM (Bilateral)  Patient location during evaluation: PACU Anesthesia Type: General Level of consciousness: awake and alert and oriented Pain management: pain level controlled Vital Signs Assessment: post-procedure vital signs reviewed and stable Respiratory status: spontaneous breathing, nonlabored ventilation and respiratory function stable Cardiovascular status: blood pressure returned to baseline and stable Postop Assessment: no signs of nausea or vomiting Anesthetic complications: no    Last Vitals:  Filed Vitals:   05/04/16 1403 05/04/16 1415  BP: 150/81 136/79  Pulse: 72 71  Temp: 36.4 C   Resp: 12 12    Last Pain: There were no vitals filed for this visit.               Graceyn Fodor A.

## 2016-05-04 NOTE — Anesthesia Procedure Notes (Signed)
Procedure Name: LMA Insertion Date/Time: 05/04/2016 1:06 PM Performed by: Talbot Grumbling Pre-anesthesia Checklist: Patient identified, Emergency Drugs available, Suction available and Patient being monitored Patient Re-evaluated:Patient Re-evaluated prior to inductionOxygen Delivery Method: Circle system utilized Preoxygenation: Pre-oxygenation with 100% oxygen Intubation Type: IV induction Ventilation: Mask ventilation without difficulty LMA: LMA inserted LMA Size: 5.0 Number of attempts: 1 Placement Confirmation: positive ETCO2 and breath sounds checked- equal and bilateral Tube secured with: Tape Dental Injury: Teeth and Oropharynx as per pre-operative assessment

## 2016-05-17 DIAGNOSIS — D090 Carcinoma in situ of bladder: Secondary | ICD-10-CM | POA: Diagnosis not present

## 2016-05-22 ENCOUNTER — Other Ambulatory Visit: Payer: Self-pay | Admitting: Urology

## 2016-05-24 DIAGNOSIS — D090 Carcinoma in situ of bladder: Secondary | ICD-10-CM | POA: Diagnosis not present

## 2016-05-24 DIAGNOSIS — C678 Malignant neoplasm of overlapping sites of bladder: Secondary | ICD-10-CM | POA: Diagnosis not present

## 2016-05-24 DIAGNOSIS — R918 Other nonspecific abnormal finding of lung field: Secondary | ICD-10-CM | POA: Diagnosis not present

## 2016-05-24 DIAGNOSIS — R3129 Other microscopic hematuria: Secondary | ICD-10-CM | POA: Diagnosis not present

## 2016-06-08 DIAGNOSIS — D090 Carcinoma in situ of bladder: Secondary | ICD-10-CM | POA: Diagnosis not present

## 2016-06-12 ENCOUNTER — Encounter (HOSPITAL_COMMUNITY): Payer: Self-pay

## 2016-06-12 NOTE — Patient Instructions (Addendum)
Ronald Lowery  06/12/2016   Your procedure is scheduled on: 06/28/2016    Report to [facility] Main  Entrance take [place]  elevators to 3rd floor to  [place] at   [place] AM.  Call this number if you have problems the morning of surgery [phone]   Remember: ONLY 1 PERSON MAY GO WITH YOU TO SHORT STAY TO GET  READY MORNING OF YOUR SURGERY.  Do not eat food or drink liquids :After Midnight. Clear liquid diet the day before surgery.    CLEAR LIQUID DIET   Foods Allowed                                                                     Foods Excluded  Coffee and tea, regular and decaf                             liquids that you cannot  Plain Jell-O in any flavor                                             see through such as: Fruit ices (not with fruit pulp)                                     milk, soups, orange juice  Iced Popsicles                                    All solid food Carbonated beverages, regular and diet                                    Cranberry, grape and apple juices Sports drinks like Gatorade Lightly seasoned clear broth or consume(fat free) Sugar, honey syrup  Sample Menu Breakfast                                Lunch                                     Supper Cranberry juice                    Beef broth                            Chicken broth Jell-O                                     Grape juice  Apple juice Coffee or tea                        Jell-O                                      Popsicle                                                Coffee or tea                        Coffee or tea  _____________________________________________________________________              Follow Bowel Prep per MD.              Fleets enema nite before surgery.              Magnesium Citrate.      Take these medicines the morning of surgery with A SIP OF WATER: Synthroid, Claritin, Prilosec, Systane eye  drops if needed, Ocean nasla spray if needed                                 You may not have any metal on your body including hair pins and              piercings  Do not wear jewelry,  lotions, powders or perfumes, deodorant             Men may shave face and neck.               Do not bring valuables to the hospital. [place].  Contacts, dentures or bridgework may not be worn into surgery.  Leave suitcase in the car. After surgery it may be brought to your room.       Special Instructions: coughing and deep breathing exercises, leg exercises               Please read over the following fact sheets you were given: _____________________________________________________________________             [facility] - Preparing for Surgery Before surgery, you can play an important role.  Because skin is not sterile, your skin needs to be as free of germs as possible.  You can reduce the number of germs on your skin by washing with CHG (chlorahexidine gluconate) soap before surgery.  CHG is an antiseptic cleaner which kills germs and bonds with the skin to continue killing germs even after washing. Please DO NOT use if you have an allergy to CHG or antibacterial soaps.  If your skin becomes reddened/irritated stop using the CHG and inform your nurse when you arrive at Short Stay. Do not shave (including legs and underarms) for at least 48 hours prior to the first CHG shower.  You may shave your face/neck. Please follow these instructions carefully:  1.  Shower with CHG Soap the night before surgery and the  morning of Surgery.  2.  If you choose to wash your hair, wash your hair  first as usual with your  normal  shampoo.  3.  After you shampoo, rinse your hair and body thoroughly to remove the  shampoo.                           4.  Use CHG as you would any other liquid soap.  You can apply chg directly  to the skin and wash                        Gently with a scrungie or clean washcloth.  5.  Apply the CHG Soap to your body ONLY FROM THE NECK DOWN.   Do not use on face/ open                           Wound or open sores. Avoid contact with eyes, ears mouth and genitals (private parts).                       Wash face,  Genitals (private parts) with your normal soap.             6.  Wash thoroughly, paying special attention to the area where your surgery  will be performed.  7.  Thoroughly rinse your body with warm water from the neck down.  8.  DO NOT shower/wash with your normal soap after using and rinsing off  the CHG Soap.                9.  Pat yourself dry with a clean towel.            10.  Wear clean pajamas.            11.  Place clean sheets on your bed the night of your first shower and do not  sleep with pets. Day of Surgery : Do not apply any lotions/deodorants the morning of surgery.  Please wear clean clothes to the hospital/surgery center.  FAILURE TO FOLLOW THESE INSTRUCTIONS MAY RESULT IN THE CANCELLATION OF YOUR SURGERY PATIENT SIGNATURE_________________________________  NURSE SIGNATURE__________________________________  ________________________________________________________________________  WHAT IS A BLOOD TRANSFUSION? Blood Transfusion Information  A transfusion is the replacement of blood or some of its parts. Blood is made up of multiple cells which provide different functions.  Red blood cells carry oxygen and are used for blood loss replacement.  White blood cells fight against infection.  Platelets control bleeding.  Plasma helps clot blood.  Other blood products are available for specialized needs, such as hemophilia or other clotting disorders. BEFORE THE TRANSFUSION  Who gives blood for transfusions?   Healthy volunteers who are fully evaluated to make sure their blood is safe. This is blood bank blood. Transfusion therapy is the safest it has ever been in the practice of medicine. Before  blood is taken from a donor, a complete history is taken to make sure that person has no history of diseases nor engages in risky social behavior (examples are intravenous drug use or sexual activity with multiple partners). The donor's travel history is screened to minimize risk of transmitting infections, such as malaria. The donated blood is tested for signs of infectious diseases, such as HIV and hepatitis. The blood is then tested to be sure it is compatible with you in order to minimize the chance of a transfusion reaction. If you or a relative donates blood, this is  often done in anticipation of surgery and is not appropriate for emergency situations. It takes many days to process the donated blood. RISKS AND COMPLICATIONS Although transfusion therapy is very safe and saves many lives, the main dangers of transfusion include:   Getting an infectious disease.  Developing a transfusion reaction. This is an allergic reaction to something in the blood you were given. Every precaution is taken to prevent this. The decision to have a blood transfusion has been considered carefully by your caregiver before blood is given. Blood is not given unless the benefits outweigh the risks. AFTER THE TRANSFUSION  Right after receiving a blood transfusion, you will usually feel much better and more energetic. This is especially true if your red blood cells have gotten low (anemic). The transfusion raises the level of the red blood cells which carry oxygen, and this usually causes an energy increase.  The nurse administering the transfusion will monitor you carefully for complications. HOME CARE INSTRUCTIONS  No special instructions are needed after a transfusion. You may find your energy is better. Speak with your caregiver about any limitations on activity for underlying diseases you may have. SEEK MEDICAL CARE IF:   Your condition is not improving after your transfusion.  You develop redness or irritation  at the intravenous (IV) site. SEEK IMMEDIATE MEDICAL CARE IF:  Any of the following symptoms occur over the next 12 hours:  Shaking chills.  You have a temperature by mouth above 102 F (38.9 C), not controlled by medicine.  Chest, back, or muscle pain.  People around you feel you are not acting correctly or are confused.  Shortness of breath or difficulty breathing.  Dizziness and fainting.  You get a rash or develop hives.  You have a decrease in urine output.  Your urine turns a dark color or changes to pink, red, or brown. Any of the following symptoms occur over the next 10 days:  You have a temperature by mouth above 102 F (38.9 C), not controlled by medicine.  Shortness of breath.  Weakness after normal activity.  The white part of the eye turns yellow (jaundice).  You have a decrease in the amount of urine or are urinating less often.  Your urine turns a dark color or changes to pink, red, or brown. Document Released: 09/28/2000 Document Revised: 12/24/2011 Document Reviewed: 05/17/2008 Exit[employer] Patient Information 2014 [place].  _______________________________________________________________________  Incentive Spirometer  An incentive spirometer is a tool that can help keep your lungs clear and active. This tool measures how well you are filling your lungs with each breath. Taking long deep breaths may help reverse or decrease the chance of developing breathing (pulmonary) problems (especially infection) following:  A long period of time when you are unable to move or be active. BEFORE THE PROCEDURE   If the spirometer includes an indicator to show your best effort, your nurse or respiratory therapist will set it to a desired goal.  If possible, sit up straight or lean slightly forward. Try not to slouch.  Hold the incentive spirometer in an upright position. INSTRUCTIONS FOR USE  1. Sit on the edge of your bed if possible, or sit up as far as  you can in bed or on a chair. 2. Hold the incentive spirometer in an upright position. 3. Breathe out normally. 4. Place the mouthpiece in your mouth and seal your lips tightly around it. 5. Breathe in slowly and as deeply as possible, raising the piston or the ball  toward the top of the column. 6. Hold your breath for 3-5 seconds or for as long as possible. Allow the piston or ball to fall to the bottom of the column. 7. Remove the mouthpiece from your mouth and breathe out normally. 8. Rest for a few seconds and repeat Steps 1 through 7 at least 10 times every 1-2 hours when you are awake. Take your time and take a few normal breaths between deep breaths. 9. The spirometer may include an indicator to show your best effort. Use the indicator as a goal to work toward during each repetition. 10. After each set of 10 deep breaths, practice coughing to be sure your lungs are clear. If you have an incision (the cut made at the time of surgery), support your incision when coughing by placing a pillow or rolled up towels firmly against it. Once you are able to get out of bed, walk around indoors and cough well. You may stop using the incentive spirometer when instructed by your caregiver.  RISKS AND COMPLICATIONS  Take your time so you do not get dizzy or light-headed.  If you are in pain, you may need to take or ask for pain medication before doing incentive spirometry. It is harder to take a deep breath if you are having pain. AFTER USE  Rest and breathe slowly and easily.  It can be helpful to keep track of a log of your progress. Your caregiver can provide you with a simple table to help with this. If you are using the spirometer at home, follow these instructions: [place] IF:   You are having difficultly using the spirometer.  You have trouble using the spirometer as often as instructed.  Your pain medication is not giving enough relief while using the spirometer.  You develop  fever of 100.5 F (38.1 C) or higher. SEEK IMMEDIATE MEDICAL CARE IF:   You cough up bloody sputum that had not been present before.  You develop fever of 102 F (38.9 C) or greater.  You develop worsening pain at or near the incision site. MAKE SURE YOU:   Understand these instructions.  Will watch your condition.  Will get help right away if you are not doing well or get worse. Document Released: 02/11/2007 Document Revised: 12/24/2011 Document Reviewed: 04/14/2007 [facility] Patient Information 2014 [place], [place].   ________________________________________________________________________

## 2016-06-14 ENCOUNTER — Encounter (HOSPITAL_COMMUNITY): Payer: Self-pay

## 2016-06-14 ENCOUNTER — Encounter (HOSPITAL_COMMUNITY)
Admission: RE | Admit: 2016-06-14 | Discharge: 2016-06-14 | Disposition: A | Discharge status: Home / Self Care | Payer: Medicare Other | Source: Ambulatory Visit | Attending: Urology | Admitting: Urology

## 2016-06-14 DIAGNOSIS — Z01818 Encounter for other preprocedural examination: Secondary | ICD-10-CM | POA: Diagnosis not present

## 2016-06-14 DIAGNOSIS — Z01812 Encounter for preprocedural laboratory examination: Secondary | ICD-10-CM | POA: Diagnosis not present

## 2016-06-14 LAB — CBC
HEMATOCRIT: 38.4 % — AB (ref 39.0–52.0)
Hemoglobin: 13.7 g/dL (ref 13.0–17.0)
MCH: 31.5 pg (ref 26.0–34.0)
MCHC: 35.7 g/dL (ref 30.0–36.0)
MCV: 88.3 fL (ref 78.0–100.0)
PLATELETS: 263 10*3/uL (ref 150–400)
RBC: 4.35 MIL/uL (ref 4.22–5.81)
RDW: 12.3 % (ref 11.5–15.5)
WBC: 5.9 10*3/uL (ref 4.0–10.5)

## 2016-06-14 LAB — COMPREHENSIVE METABOLIC PANEL
ALBUMIN: 4.5 g/dL (ref 3.5–5.0)
ALT: 14 U/L — AB (ref 17–63)
AST: 23 U/L (ref 15–41)
Alkaline Phosphatase: 71 U/L (ref 38–126)
Anion gap: 8 (ref 5–15)
BILIRUBIN TOTAL: 0.9 mg/dL (ref 0.3–1.2)
BUN: 8 mg/dL (ref 6–20)
CHLORIDE: 93 mmol/L — AB (ref 101–111)
CO2: 28 mmol/L (ref 22–32)
Calcium: 9 mg/dL (ref 8.9–10.3)
Creatinine, Ser: 0.88 mg/dL (ref 0.61–1.24)
GFR calc Af Amer: >60 mL/min (ref >60)
GFR calc non Af Amer: >60 mL/min (ref >60)
GLUCOSE: 87 mg/dL (ref 65–99)
Potassium: 4.2 mmol/L (ref 3.5–5.1)
Sodium: 129 mmol/L — ABNORMAL LOW (ref 135–145)
Total Protein: 7.3 g/dL (ref 6.5–8.1)

## 2016-06-14 NOTE — Consult Note (Signed)
Ronald Lowery Nurse requested for preoperative stoma site marking  Discussed surgical procedure and stoma creation with patient and family.  Explained role of the [place] nurse team.  Provided the patient with educational booklet/DVD and provided samples of pouching options.  Answered patient and family questions.   Examined patient lying, sitting, and standing in order to place the marking in the patient's visual field, away from any creases or abdominal contour issues and within the rectus muscle.  Attempted to mark below the patient's belt line.    Marked for ileal conduit in the RLQ 4  cm to the right of the umbilicus and  3 cm below the umbilicus.   Patient's abdomen cleansed with CHG wipes at site markings, allowed to air dry prior to marking.Covered mark with thin film transparent dressing to preserve mark until date of surgery. Patient surgery is one month away.  Encouraged to refresh ink mark as needed.   [place] Nurse team will follow up with patient after surgery for continue ostomy care and teaching.   Domenic Moras RN BSN [place] Pager [phone]

## 2016-06-14 NOTE — Progress Notes (Signed)
CMp done 06/14/2016 faxed via EPIC to Dr Alinda Money.

## 2016-06-14 NOTE — Progress Notes (Signed)
EKG-04/27/2016- EPIC  05/24/2016- Chest CT on chart from office of Dr Alinda Money

## 2016-06-20 DIAGNOSIS — J302 Other seasonal allergic rhinitis: Secondary | ICD-10-CM | POA: Diagnosis not present

## 2016-06-20 DIAGNOSIS — M7989 Other specified soft tissue disorders: Secondary | ICD-10-CM | POA: Diagnosis not present

## 2016-06-20 DIAGNOSIS — K219 Gastro-esophageal reflux disease without esophagitis: Secondary | ICD-10-CM | POA: Diagnosis not present

## 2016-06-20 DIAGNOSIS — I872 Venous insufficiency (chronic) (peripheral): Secondary | ICD-10-CM | POA: Diagnosis not present

## 2016-06-20 DIAGNOSIS — Z6823 Body mass index (BMI) 23.0-23.9, adult: Secondary | ICD-10-CM | POA: Diagnosis not present

## 2016-06-20 DIAGNOSIS — I10 Essential (primary) hypertension: Secondary | ICD-10-CM | POA: Diagnosis not present

## 2016-06-20 DIAGNOSIS — M4806 Spinal stenosis, lumbar region: Secondary | ICD-10-CM | POA: Diagnosis not present

## 2016-06-20 DIAGNOSIS — F329 Major depressive disorder, single episode, unspecified: Secondary | ICD-10-CM | POA: Diagnosis not present

## 2016-06-20 DIAGNOSIS — C61 Malignant neoplasm of prostate: Secondary | ICD-10-CM | POA: Diagnosis not present

## 2016-06-20 DIAGNOSIS — C679 Malignant neoplasm of bladder, unspecified: Secondary | ICD-10-CM | POA: Diagnosis not present

## 2016-06-20 DIAGNOSIS — Z23 Encounter for immunization: Secondary | ICD-10-CM | POA: Diagnosis not present

## 2016-06-20 DIAGNOSIS — M545 Low back pain: Secondary | ICD-10-CM | POA: Diagnosis not present

## 2016-06-26 DIAGNOSIS — M25552 Pain in left hip: Secondary | ICD-10-CM | POA: Diagnosis not present

## 2016-06-26 DIAGNOSIS — Z471 Aftercare following joint replacement surgery: Secondary | ICD-10-CM | POA: Diagnosis not present

## 2016-06-26 DIAGNOSIS — Z96642 Presence of left artificial hip joint: Secondary | ICD-10-CM | POA: Diagnosis not present

## 2016-06-27 ENCOUNTER — Encounter (HOSPITAL_COMMUNITY): Payer: Self-pay | Admitting: Certified Registered"

## 2016-06-27 NOTE — H&P (Signed)
Office Visit Report     06/08/2016   --------------------------------------------------------------------------------   [name]  MRN: [id number]  PRIMARY CARE:  Ronald Lowery. Ronald Minium, MD  DOB: September 06, 1941, 75 year old Male  REFERRING:  Raynelle Bring, MD  SSN: -[id number]  PROVIDER:  Raynelle Bring, M.D.    LOCATION:  [facility] [phone]   --------------------------------------------------------------------------------   CC/HPI: BCG refractory CIS   [name] is seen today in the company of his wife to discuss plans for his upcoming radical cystectomy. After our last discussion, he has elected to proceed with an ileal conduit for urinary diversion. Currently, he is voiding well without hematuria. He denies any new health problems.   His surgical history is complicated by a large bilateral laparoscopic inguinal hernia repair. He also has undergone a prior prostatectomy. Both of these prior surgeries will increase the potential risk and complexity of his radical cystectomy. Specifically, the plan has been to proceed with a robot-assisted laparoscopic radical cystectomy to try to avoid this hernia mesh and hopefully help facilitate the dissection of the bladder posteriorly near the rectum.     ALLERGIES: Cefdinir CAPS Dust Mold Myrbetriq TB24    MEDICATIONS: Acetaminophen 500 MG CAPS Oral  Furosemide 40 MG Oral Tablet Oral  Gabapentin 300 MG TABS Oral  K-Dur 10 MEQ TBCR Oral  Loratadine 10 MG Oral Tablet Oral  Losartan Potassium 50 MG Oral Tablet Oral  Natural Fiber Laxative POWD Oral  Omeprazole 20 MG Oral Capsule Delayed Release Oral  Oxybutynin Chloride 5 MG Oral Tablet 0 Oral  Saline Nasal Spray SOLN Nasal  Synthroid 137 MCG Oral Tablet Oral  Zolpidem Tartrate 10 MG Oral Tablet Oral     GU PSH: Cystoscopy - 04/20/2016 Cystoscopy TURBT >5 cm - 01/02/2016, 11/17/2015, 05/19/2015 Cystoscopy TURBT 2-5 cm - 05/04/2016 Hernia Repair W/mesh, Bilateral,  Laparoscopic Locm 300-399Mg /Ml Iodine,1Ml - 05/24/2016 Robotic Radical Prostatectomy - 2010      PSH Notes: Cystoscopy With Fulguration Large Lesion (Over 5cm), Cystoscopy With Fulguration Large Lesion (Over 5cm), Total Knee Arthroplasty, Cataract Surgery, Cystoscopy With Fulguration Large Lesion (Over 5cm), Inguinal Hernia Repair, Prostatect Retropubic Radical W/ Nerve Sparing Laparoscopic, Tonsillectomy, Appendectomy, Foot Surgery   NON-GU PSH: Appendectomy - 2009 Remove Tonsils - 2009 Total Knee Replacement - 10/28/2015    GU PMH: Bladder Cancer, overlapping sites, Malignant neoplasm of overlapping sites of bladder - 12/26/2015, Malignant neoplasm of overlapping sites of bladder, - 11/17/2015 Ca In Situ Bladder, Carcinoma in situ of bladder - 12/26/2015 Urinary Frequency, Increased urinary frequency - 12/26/2015 Prostate Cancer, Prostate cancer - 10/19/2015 Gross hematuria, Gross hematuria - 04/05/2015 Male ED, unspecified, Erectile dysfunction - 01/26/2015 Unil Inguinal Hernia W/O obst or gang,non-recurrent, Left inguinal hernia - 09/22/2014      PMH Notes:   1) Prostate cancer: He is s/p a BNS RAL radical prostatectomy on October 25, 2008. His PSA has been undetectable since treatment.   TNM stage: pT2c N0 Mx  Gleason score: 3+4=7  Surgical margins: Negative  Pretreatment PSA: 8.3  Pretreatment SHIM: 22 (He did use Levitra 20 mg preoperatively)   2) Urothelial carcinoma of the bladder: He presented in June 2016 with gross hematuria and a history of storage urinary symptoms. He was found to have a bladder tumor and TURBT revealed carcinoma in situ of the bladder.   Jul 2016: TURBT - CIS of the bladder  Sep-Oct 2016: Induction BCG  Jan 2017: TURBT - high volume, high grade Ta  Mar 2017: Restaging TURBT -  CIS (all visible tumor removed)  Apr-May 2017: Repeat induction BCG  Jul 2017: TUR - recurrent CIS    3) LUTS: He has had chronic urinary frequency even before his bladder cancer  diagnosis.   Current treatment: Vesicare 5 mg  Prior treatment: Myrbetriq (stopped due to itching)     NON-GU PMH: Anxiety disorder, unspecified, Anxiety - 2014 Personal history of other endocrine, nutritional and metabolic disease, History of hypothyroidism - 2014, History of hypercholesterolemia, - 2014 Personal history of other mental and behavioral disorders, History of depression - 2014 Personal history of other specified conditions, History of heartburn - 2014    FAMILY HISTORY: Malignant Melanoma Of The Skin - Father Prostate Cancer - Runs In Family   SOCIAL HISTORY: Marital Status: Married Current Smoking Status: Patient has never smoked.  Social Drinker.      Notes: Never A Smoker, Occupation: Retired, Alcohol Use, Tobacco Use   REVIEW OF SYSTEMS:    GU Review Male:   Patient reports frequent urination, hard to postpone urination, get up at night to urinate, leakage of urine, and stream starts and stops. Patient denies burning/ pain with urination, trouble starting your streams, and have to strain to urinate .  Gastrointestinal (Lower):   Patient denies diarrhea and constipation.  Gastrointestinal (Upper):   Patient denies nausea and vomiting.  Constitutional:   Patient denies fever, night sweats, weight loss, and fatigue.  Skin:   Patient denies skin rash/ lesion and itching.  Eyes:   Patient denies blurred vision and double vision.  Ears/ Nose/ Throat:   Patient denies sore throat and sinus problems.  Hematologic/Lymphatic:   Patient denies swollen glands and easy bruising.  Cardiovascular:   Patient denies leg swelling and chest pains.  Respiratory:   Patient denies cough and shortness of breath.  Endocrine:   Patient denies excessive thirst.  Musculoskeletal:   Patient reports back pain. Patient denies joint pain.  Neurological:   Patient denies dizziness and headaches.  Psychologic:   Patient denies depression and anxiety.   Notes: He denies chest pain, shortness of  breath, or palpitations.   VITAL SIGNS:      06/08/2016 11:39 AM  BP 132/82 mmHg  Pulse 71 /min   MULTI-SYSTEM PHYSICAL EXAMINATION:    Constitutional: Well-nourished. No physical deformities. Normally developed. Good grooming.  Neck: Neck symmetrical, not swollen. Normal tracheal position.  Respiratory: No labored breathing, no use of accessory muscles.   Cardiovascular: Normal temperature, normal extremity pulses, no swelling, no varicosities.  Lymphatic: No enlargement of neck, axillae, groin.  Skin: No paleness, no jaundice, no cyanosis. No lesion, no ulcer, no rash.  Neurologic / Psychiatric: Oriented to time, oriented to place, oriented to person. No depression, no anxiety, no agitation.  Gastrointestinal: No mass, no tenderness, no rigidity, non obese abdomen.  Eyes: Normal conjunctivae. Normal eyelids.  Ears, Nose, Mouth, and Throat: Left ear no scars, no lesions, no masses. Right ear no scars, no lesions, no masses. Nose no scars, no lesions, no masses. Normal hearing. Normal lips.  Musculoskeletal: Normal gait and station of head and neck.     PAST DATA REVIEWED:  Source Of History:  Patient  Records Review:   Pathology Reports  Urine Test Review:   Urinalysis  X-Ray Review: C.T. Chest/ Abd/Pelvis: Reviewed Films.     04/13/16 01/27/15 12/25/13 12/23/12 05/30/12 11/02/11 06/08/11 12/01/10  PSA  Total PSA < 0.01  <0.01  <0.01  <0.01  <0.01  <0.01  0.01  0.01  Notes:                     I independently reviewed a CT scan of the chest, abdomen, and pelvis. This demonstrates nonspecific bilateral pulmonary nodules which appear to be benign based on their stability over the past 10 years. He has no evidence of regional lymphadenopathy or metastatic disease.   PROCEDURES:          Urinalysis - 81003 Dipstick Dipstick Cont'd  Specimen: Voided Bilirubin: Neg  Color: Straw Ketones: Neg  Appearance: Clear Blood: Neg  Specific Gravity: 1.010 Protein: Neg  pH: 6.5  Urobilinogen: 0.2  Glucose: Neg Nitrites: Neg    Leukocyte Esterase: Neg    ASSESSMENT:      ICD-10 Details  1 GU:   Ca In Situ Bladder - D09.0    PLAN:           Schedule Return Visit: Keep Scheduled Appointment          Document Letter(s):  Created for Patient: Clinical Summary         Notes:   1. 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 He understands that standard treatment would be to proceed with radical cystectomy. After further thought, he has elected to pursue this approach. Our plan is to perform a robot-assisted laparoscopic radical cystectomy, bilateral pelvic lymphadenectomy, and ileal conduit urinary diversion.   Considering his prior mesh hernia repair laparoscopically and prior radical prostatectomy, we did discuss the potential increased risks associated with this surgery and the increased complexity than likely will take longer operative time. Furthermore, we discussed that he has undergone a pelvic lymphadenectomy previously and that the ability to perform a pelvic lymphadenectomy and a more extensive fashion may be somewhat limited although will be attempted at the time of his procedure. We reviewed the potential risks and complications of this portion of the procedure including bleeding, infection, major cardiopulmonary risks such as myocardial infarction, stroke, venothromboembolism, etc. We reviewed the risks of injury to adjacent structures including but not limited to the bowel, major vascular structures, major neurologic structures, the rectum, etc. We have discussed the risk of cancer recurrence and need for possible further therapy or additional procedures.   We discussed options for urinary diversion. After reviewing these options at his last visit, he has elected to proceed with an ileal conduit. He understands the need for enterostomal therapy consultation for stomal marking and we discussed where on his abdomen  this likely would be based on his evaluation today. We discussed this portion of the operation in detail including issues related to his quality of life with an ileal conduit for the future. We reviewed the potential risks of this portion of the procedure in detail including the risk of bowel obstruction, bowel leak, urine leak, urinary tract stricture, stomal stenosis, metabolic abnormalities, etc.   He feels well informed and is prepared to proceed with the planned procedure. All questions were answered to his stated satisfaction.   2. Prostate cancer: His next PSA is due for the summer of 2018.   Cc: Dr. Burnard Bunting    E & M CODE: I spent at least 40 minutes face to face with the patient, more than 50% of that time was spent on counseling and/or coordinating care.     * Signed by Raynelle Bring, M.D. on 06/08/16 at 10:24 PM (EDT)*

## 2016-06-27 NOTE — Anesthesia Preprocedure Evaluation (Signed)
Anesthesia Evaluation  Patient identified by MRN, date of birth, ID band Patient awake    Reviewed: Allergy & Precautions, NPO status , Patient's Chart, lab work & pertinent test results  History of Anesthesia Complications (+) PROLONGED EMERGENCE and history of anesthetic complications  Airway Mallampati: II  TM Distance: >3 FB Neck ROM: Full    Dental no notable dental hx. (+) Teeth Intact   Pulmonary neg pulmonary ROS,    Pulmonary exam normal breath sounds clear to auscultation       Cardiovascular Exercise Tolerance: Good hypertension, Pt. on medications Normal cardiovascular exam Rhythm:Regular Rate:Normal     Neuro/Psych PSYCHIATRIC DISORDERS Anxiety Depression Neuropathy negative psych ROS   GI/Hepatic Neg liver ROS, hiatal hernia, GERD  Medicated,  Endo/Other  Hypothyroidism Hyponatremia  Renal/GU negative Renal ROS   Bladder Ca    Musculoskeletal  (+) Arthritis , Scoliosis   Abdominal Normal abdominal exam  (+)   Peds negative pediatric ROS (+)  Hematology negative hematology ROS (+)   Anesthesia Other Findings   Reproductive/Obstetrics negative OB ROS                             Anesthesia Physical  Anesthesia Plan  ASA: III  Anesthesia Plan: General   Post-op Pain Management:    Induction: Intravenous  Airway Management Planned: Oral ETT  Additional Equipment:   Intra-op Plan:   Post-operative Plan: Extubation in OR  Informed Consent: I have reviewed the patients History and Physical, chart, labs and discussed the procedure including the risks, benefits and alternatives for the proposed anesthesia with the patient or authorized representative who has indicated his/her understanding and acceptance.   Dental advisory given  Plan Discussed with: CRNA, Anesthesiologist and Surgeon  Anesthesia Plan Comments: (Previously needed muscle relaxation with ETT.  Grade II view)        Anesthesia Quick Evaluation

## 2016-06-28 ENCOUNTER — Encounter (HOSPITAL_COMMUNITY): Payer: Self-pay

## 2016-06-28 ENCOUNTER — Inpatient Hospital Stay (HOSPITAL_COMMUNITY): Payer: Medicare Other | Admitting: Anesthesiology

## 2016-06-28 ENCOUNTER — Inpatient Hospital Stay (HOSPITAL_COMMUNITY)
Admission: RE | Admit: 2016-06-28 | Discharge: 2016-07-05 | DRG: 654 | Disposition: A | Discharge status: Home Health | Payer: Medicare Other | Source: Ambulatory Visit | Attending: Urology | Admitting: Urology

## 2016-06-28 ENCOUNTER — Encounter (HOSPITAL_COMMUNITY)
Admission: RE | Disposition: A | Discharge status: Home Health | Payer: Self-pay | Source: Ambulatory Visit | Attending: Urology

## 2016-06-28 ENCOUNTER — Inpatient Hospital Stay (HOSPITAL_COMMUNITY): Payer: Medicare Other

## 2016-06-28 DIAGNOSIS — Z888 Allergy status to other drugs, medicaments and biological substances status: Secondary | ICD-10-CM

## 2016-06-28 DIAGNOSIS — R110 Nausea: Secondary | ICD-10-CM | POA: Diagnosis not present

## 2016-06-28 DIAGNOSIS — I10 Essential (primary) hypertension: Secondary | ICD-10-CM | POA: Diagnosis present

## 2016-06-28 DIAGNOSIS — Z808 Family history of malignant neoplasm of other organs or systems: Secondary | ICD-10-CM | POA: Diagnosis not present

## 2016-06-28 DIAGNOSIS — Q438 Other specified congenital malformations of intestine: Secondary | ICD-10-CM

## 2016-06-28 DIAGNOSIS — F419 Anxiety disorder, unspecified: Secondary | ICD-10-CM | POA: Diagnosis present

## 2016-06-28 DIAGNOSIS — Z79899 Other long term (current) drug therapy: Secondary | ICD-10-CM

## 2016-06-28 DIAGNOSIS — E039 Hypothyroidism, unspecified: Secondary | ICD-10-CM | POA: Diagnosis present

## 2016-06-28 DIAGNOSIS — Z881 Allergy status to other antibiotic agents status: Secondary | ICD-10-CM | POA: Diagnosis not present

## 2016-06-28 DIAGNOSIS — C679 Malignant neoplasm of bladder, unspecified: Secondary | ICD-10-CM | POA: Diagnosis present

## 2016-06-28 DIAGNOSIS — Z9079 Acquired absence of other genital organ(s): Secondary | ICD-10-CM

## 2016-06-28 DIAGNOSIS — R2689 Other abnormalities of gait and mobility: Secondary | ICD-10-CM

## 2016-06-28 DIAGNOSIS — R262 Difficulty in walking, not elsewhere classified: Secondary | ICD-10-CM

## 2016-06-28 DIAGNOSIS — D090 Carcinoma in situ of bladder: Secondary | ICD-10-CM | POA: Diagnosis present

## 2016-06-28 DIAGNOSIS — K660 Peritoneal adhesions (postprocedural) (postinfection): Secondary | ICD-10-CM | POA: Diagnosis present

## 2016-06-28 DIAGNOSIS — K219 Gastro-esophageal reflux disease without esophagitis: Secondary | ICD-10-CM | POA: Diagnosis present

## 2016-06-28 DIAGNOSIS — R0602 Shortness of breath: Secondary | ICD-10-CM | POA: Diagnosis not present

## 2016-06-28 DIAGNOSIS — Z8042 Family history of malignant neoplasm of prostate: Secondary | ICD-10-CM | POA: Diagnosis not present

## 2016-06-28 DIAGNOSIS — Z8546 Personal history of malignant neoplasm of prostate: Secondary | ICD-10-CM

## 2016-06-28 DIAGNOSIS — R509 Fever, unspecified: Secondary | ICD-10-CM | POA: Diagnosis not present

## 2016-06-28 HISTORY — PX: ROBOT ASSISTED LAPAROSCOPIC COMPLETE CYSTECT ILEAL CONDUIT: SHX5139

## 2016-06-28 HISTORY — PX: LYMPHADENECTOMY: SHX5960

## 2016-06-28 LAB — BASIC METABOLIC PANEL
ANION GAP: 9 (ref 5–15)
BUN: 9 mg/dL (ref 6–20)
CALCIUM: 8.7 mg/dL — AB (ref 8.9–10.3)
CO2: 27 mmol/L (ref 22–32)
CREATININE: 1.02 mg/dL (ref 0.61–1.24)
Chloride: 98 mmol/L — ABNORMAL LOW (ref 101–111)
GFR calc Af Amer: >60 mL/min (ref >60)
GLUCOSE: 134 mg/dL — AB (ref 65–99)
Potassium: 3.9 mmol/L (ref 3.5–5.1)
Sodium: 134 mmol/L — ABNORMAL LOW (ref 135–145)

## 2016-06-28 LAB — TYPE AND SCREEN
ABO/RH(D): O POS
ANTIBODY SCREEN: NEGATIVE

## 2016-06-28 LAB — MRSA PCR SCREENING: MRSA BY PCR: NEGATIVE

## 2016-06-28 LAB — HEMOGLOBIN AND HEMATOCRIT, BLOOD
HCT: 39.5 % (ref 39.0–52.0)
Hemoglobin: 13.8 g/dL (ref 13.0–17.0)

## 2016-06-28 SURGERY — CYSTECTOMY, ROBOT-ASSISTED, WITH ILEAL CONDUIT CREATION
Anesthesia: General

## 2016-06-28 MED ORDER — PROPOFOL 10 MG/ML IV BOLUS
INTRAVENOUS | Status: DC | PRN
  Administered 2016-06-28: 180 mg via INTRAVENOUS

## 2016-06-28 MED ORDER — SODIUM CHLORIDE 0.9 % IV SOLN
1.5000 g | INTRAVENOUS | Status: AC
  Administered 2016-06-28: 1.5 g via INTRAVENOUS
  Filled 2016-06-28: qty 1.5

## 2016-06-28 MED ORDER — ALVIMOPAN 12 MG PO CAPS
12.0000 mg | ORAL_CAPSULE | Freq: Once | ORAL | Status: AC
  Administered 2016-06-28: 12 mg via ORAL
  Filled 2016-06-28: qty 1

## 2016-06-28 MED ORDER — SODIUM CHLORIDE 0.9 % IR SOLN
Status: DC | PRN
  Administered 2016-06-28: 1000 mL

## 2016-06-28 MED ORDER — SODIUM CHLORIDE 0.9 % IV SOLN
1.5000 g | Freq: Four times a day (QID) | INTRAVENOUS | Status: AC
  Administered 2016-06-28 – 2016-06-29 (×3): 1.5 g via INTRAVENOUS
  Filled 2016-06-28 (×3): qty 1.5

## 2016-06-28 MED ORDER — FENTANYL CITRATE (PF) 100 MCG/2ML IJ SOLN
INTRAMUSCULAR | Status: AC
  Filled 2016-06-28: qty 2

## 2016-06-28 MED ORDER — SODIUM CHLORIDE 0.9% FLUSH: 9.0000 mL | INTRAVENOUS | Status: DC | PRN

## 2016-06-28 MED ORDER — DEXAMETHASONE SODIUM PHOSPHATE 10 MG/ML IJ SOLN
INTRAMUSCULAR | Status: DC | PRN
  Administered 2016-06-28: 10 mg via INTRAVENOUS

## 2016-06-28 MED ORDER — BUPIVACAINE-EPINEPHRINE 0.25% -1:200000 IJ SOLN
INTRAMUSCULAR | Status: DC | PRN
  Administered 2016-06-28: 30 mL

## 2016-06-28 MED ORDER — HEPARIN SODIUM (PORCINE) 1000 UNIT/ML IJ SOLN
INTRAMUSCULAR | Status: AC
  Filled 2016-06-28: qty 1

## 2016-06-28 MED ORDER — OXYCODONE HCL 5 MG PO TABS: 5.0000 mg | ORAL_TABLET | Freq: Once | ORAL | Status: DC | PRN

## 2016-06-28 MED ORDER — ONDANSETRON HCL 4 MG/2ML IJ SOLN
INTRAMUSCULAR | Status: AC
  Filled 2016-06-28: qty 2

## 2016-06-28 MED ORDER — HYDROCODONE-ACETAMINOPHEN 5-325 MG PO TABS: 1.0000 | ORAL_TABLET | Freq: Four times a day (QID) | ORAL | 0 refills | Status: DC | PRN

## 2016-06-28 MED ORDER — SODIUM CHLORIDE 0.9 % IV SOLN
INTRAVENOUS | Status: AC
  Filled 2016-06-28: qty 100

## 2016-06-28 MED ORDER — SUGAMMADEX SODIUM 200 MG/2ML IV SOLN
INTRAVENOUS | Status: AC
  Filled 2016-06-28: qty 2

## 2016-06-28 MED ORDER — POLYVINYL ALCOHOL 1.4 % OP SOLN
1.0000 [drp] | OPHTHALMIC | Status: DC | PRN
  Filled 2016-06-28: qty 15

## 2016-06-28 MED ORDER — PROMETHAZINE HCL 25 MG/ML IJ SOLN: 6.2500 mg | INTRAMUSCULAR | Status: DC | PRN

## 2016-06-28 MED ORDER — STERILE WATER FOR IRRIGATION IR SOLN
Status: DC | PRN
  Administered 2016-06-28: 1000 mL

## 2016-06-28 MED ORDER — DIPHENHYDRAMINE HCL 50 MG/ML IJ SOLN: 12.5000 mg | Freq: Four times a day (QID) | INTRAMUSCULAR | Status: DC | PRN

## 2016-06-28 MED ORDER — LACTATED RINGERS IV SOLN
INTRAVENOUS | Status: DC | PRN
  Administered 2016-06-28 (×3): via INTRAVENOUS

## 2016-06-28 MED ORDER — ENOXAPARIN SODIUM 40 MG/0.4ML ~~LOC~~ SOLN
40.0000 mg | SUBCUTANEOUS | Status: DC
  Administered 2016-06-29 – 2016-07-05 (×7): 40 mg via SUBCUTANEOUS
  Filled 2016-06-28 (×8): qty 0.4

## 2016-06-28 MED ORDER — ONDANSETRON HCL 4 MG/2ML IJ SOLN
4.0000 mg | INTRAMUSCULAR | Status: DC | PRN
  Administered 2016-06-30 – 2016-07-02 (×4): 4 mg via INTRAVENOUS
  Filled 2016-06-28 (×4): qty 2

## 2016-06-28 MED ORDER — MAGNESIUM CITRATE PO SOLN
1.0000 | Freq: Once | ORAL | Status: DC
  Filled 2016-06-28: qty 296

## 2016-06-28 MED ORDER — PANTOPRAZOLE SODIUM 40 MG PO TBEC
40.0000 mg | DELAYED_RELEASE_TABLET | Freq: Every day | ORAL | Status: DC
  Administered 2016-06-29 – 2016-07-05 (×7): 40 mg via ORAL
  Filled 2016-06-28 (×7): qty 1

## 2016-06-28 MED ORDER — HYDROMORPHONE HCL 1 MG/ML IJ SOLN
INTRAMUSCULAR | Status: DC | PRN
  Administered 2016-06-28 (×4): 0.5 mg via INTRAVENOUS

## 2016-06-28 MED ORDER — LIDOCAINE 2% (20 MG/ML) 5 ML SYRINGE
INTRAMUSCULAR | Status: AC
  Filled 2016-06-28: qty 5

## 2016-06-28 MED ORDER — HYDROMORPHONE 1 MG/ML IV SOLN
INTRAVENOUS | Status: DC
  Administered 2016-06-28: 19:00:00 via INTRAVENOUS
  Administered 2016-06-28: 0.8 mg via INTRAVENOUS
  Administered 2016-06-28: 0.2 mg via INTRAVENOUS
  Administered 2016-06-29: 0.8 mg via INTRAVENOUS

## 2016-06-28 MED ORDER — MIDAZOLAM HCL 2 MG/2ML IJ SOLN
INTRAMUSCULAR | Status: AC
Start: 2016-06-28 — End: 2016-06-28
  Filled 2016-06-28: qty 2

## 2016-06-28 MED ORDER — DOCUSATE SODIUM 100 MG PO CAPS
100.0000 mg | ORAL_CAPSULE | Freq: Two times a day (BID) | ORAL | Status: DC
  Administered 2016-06-29 – 2016-07-05 (×7): 100 mg via ORAL
  Filled 2016-06-28 (×11): qty 1

## 2016-06-28 MED ORDER — LACTATED RINGERS IR SOLN
Status: DC | PRN
  Administered 2016-06-28: 1000 mL

## 2016-06-28 MED ORDER — ROCURONIUM BROMIDE 10 MG/ML (PF) SYRINGE
PREFILLED_SYRINGE | INTRAVENOUS | Status: AC
  Filled 2016-06-28: qty 10

## 2016-06-28 MED ORDER — DIPHENHYDRAMINE HCL 12.5 MG/5ML PO ELIX
12.5000 mg | ORAL_SOLUTION | Freq: Four times a day (QID) | ORAL | Status: DC | PRN
  Filled 2016-06-28: qty 5

## 2016-06-28 MED ORDER — ALVIMOPAN 12 MG PO CAPS
12.0000 mg | ORAL_CAPSULE | Freq: Two times a day (BID) | ORAL | Status: DC
  Administered 2016-06-29 – 2016-06-30 (×4): 12 mg via ORAL
  Filled 2016-06-28 (×4): qty 1

## 2016-06-28 MED ORDER — SUGAMMADEX SODIUM 200 MG/2ML IV SOLN
INTRAVENOUS | Status: DC | PRN
  Administered 2016-06-28: 180 mg via INTRAVENOUS

## 2016-06-28 MED ORDER — KETOROLAC TROMETHAMINE 15 MG/ML IJ SOLN
INTRAMUSCULAR | Status: DC | PRN
  Administered 2016-06-28: 15 mg via INTRAVENOUS

## 2016-06-28 MED ORDER — FLEET ENEMA 7-19 GM/118ML RE ENEM: 1.0000 | ENEMA | Freq: Once | RECTAL | Status: DC

## 2016-06-28 MED ORDER — HYDROMORPHONE HCL 2 MG/ML IJ SOLN
INTRAMUSCULAR | Status: AC
  Filled 2016-06-28: qty 1

## 2016-06-28 MED ORDER — HYDROMORPHONE 1 MG/ML IV SOLN
INTRAVENOUS | Status: AC
  Filled 2016-06-28: qty 25

## 2016-06-28 MED ORDER — POTASSIUM CHLORIDE CRYS ER 20 MEQ PO TBCR: 20.0000 meq | EXTENDED_RELEASE_TABLET | Freq: Every morning | ORAL | Status: DC

## 2016-06-28 MED ORDER — FENTANYL CITRATE (PF) 100 MCG/2ML IJ SOLN
INTRAMUSCULAR | Status: DC | PRN
  Administered 2016-06-28 (×2): 50 ug via INTRAVENOUS

## 2016-06-28 MED ORDER — KETOROLAC TROMETHAMINE 30 MG/ML IJ SOLN
INTRAMUSCULAR | Status: AC
  Filled 2016-06-28: qty 1

## 2016-06-28 MED ORDER — LEVOTHYROXINE SODIUM 25 MCG PO TABS
125.0000 ug | ORAL_TABLET | Freq: Every day | ORAL | Status: DC
  Administered 2016-06-29 – 2016-07-05 (×7): 125 ug via ORAL
  Filled 2016-06-28 (×7): qty 1

## 2016-06-28 MED ORDER — PROPOFOL 10 MG/ML IV BOLUS
INTRAVENOUS | Status: AC
  Filled 2016-06-28: qty 20

## 2016-06-28 MED ORDER — AMPICILLIN-SULBACTAM SODIUM 1.5 (1-0.5) G IJ SOLR
INTRAMUSCULAR | Status: AC
  Filled 2016-06-28: qty 1.5

## 2016-06-28 MED ORDER — GABAPENTIN 100 MG PO CAPS
400.0000 mg | ORAL_CAPSULE | Freq: Every day | ORAL | Status: DC
  Administered 2016-06-29 – 2016-07-04 (×6): 400 mg via ORAL
  Filled 2016-06-28 (×6): qty 4

## 2016-06-28 MED ORDER — DEXAMETHASONE SODIUM PHOSPHATE 10 MG/ML IJ SOLN
INTRAMUSCULAR | Status: AC
  Filled 2016-06-28: qty 1

## 2016-06-28 MED ORDER — POLYETHYL GLYCOL-PROPYL GLYCOL 0.4-0.3 % OP SOLN: 1.0000 [drp] | Freq: Four times a day (QID) | OPHTHALMIC | Status: DC | PRN

## 2016-06-28 MED ORDER — LACTATED RINGERS IV SOLN: INTRAVENOUS | Status: DC

## 2016-06-28 MED ORDER — GABAPENTIN 100 MG PO CAPS: 100.0000 mg | ORAL_CAPSULE | ORAL | Status: DC

## 2016-06-28 MED ORDER — ONDANSETRON HCL 4 MG/2ML IJ SOLN
INTRAMUSCULAR | Status: DC | PRN
  Administered 2016-06-28: 4 mg via INTRAVENOUS

## 2016-06-28 MED ORDER — FENTANYL CITRATE (PF) 100 MCG/2ML IJ SOLN: 25.0000 ug | INTRAMUSCULAR | Status: DC | PRN

## 2016-06-28 MED ORDER — OXYCODONE HCL 5 MG/5ML PO SOLN
5.0000 mg | Freq: Once | ORAL | Status: DC | PRN
  Filled 2016-06-28: qty 5

## 2016-06-28 MED ORDER — ROCURONIUM BROMIDE 10 MG/ML (PF) SYRINGE
PREFILLED_SYRINGE | INTRAVENOUS | Status: DC | PRN
  Administered 2016-06-28 (×2): 10 mg via INTRAVENOUS
  Administered 2016-06-28: 20 mg via INTRAVENOUS
  Administered 2016-06-28: 10 mg via INTRAVENOUS
  Administered 2016-06-28: 60 mg via INTRAVENOUS
  Administered 2016-06-28: 20 mg via INTRAVENOUS

## 2016-06-28 MED ORDER — KETOROLAC TROMETHAMINE 15 MG/ML IJ SOLN
15.0000 mg | Freq: Four times a day (QID) | INTRAMUSCULAR | Status: AC
  Administered 2016-06-28 – 2016-07-01 (×12): 15 mg via INTRAVENOUS
  Filled 2016-06-28 (×12): qty 1

## 2016-06-28 MED ORDER — 0.9 % SODIUM CHLORIDE (POUR BTL) OPTIME
TOPICAL | Status: DC | PRN
  Administered 2016-06-28: 1000 mL

## 2016-06-28 MED ORDER — FUROSEMIDE 40 MG PO TABS
40.0000 mg | ORAL_TABLET | Freq: Every morning | ORAL | Status: DC
  Administered 2016-06-29 – 2016-07-05 (×7): 40 mg via ORAL
  Filled 2016-06-28 (×7): qty 1

## 2016-06-28 MED ORDER — HYDRALAZINE HCL 20 MG/ML IJ SOLN: 5.0000 mg | Freq: Four times a day (QID) | INTRAMUSCULAR | Status: DC | PRN

## 2016-06-28 MED ORDER — BUPIVACAINE-EPINEPHRINE (PF) 0.25% -1:200000 IJ SOLN
INTRAMUSCULAR | Status: AC
  Filled 2016-06-28: qty 30

## 2016-06-28 MED ORDER — FENTANYL CITRATE (PF) 100 MCG/2ML IJ SOLN
25.0000 ug | INTRAMUSCULAR | Status: DC | PRN
  Administered 2016-06-28: 50 ug via INTRAVENOUS

## 2016-06-28 MED ORDER — LIDOCAINE 2% (20 MG/ML) 5 ML SYRINGE
INTRAMUSCULAR | Status: DC | PRN
  Administered 2016-06-28: 100 mg via INTRAVENOUS

## 2016-06-28 MED ORDER — NALOXONE HCL 0.4 MG/ML IJ SOLN: 0.4000 mg | INTRAMUSCULAR | Status: DC | PRN

## 2016-06-28 SURGICAL SUPPLY — 67 items
APPLICATOR COTTON TIP 6IN STRL (MISCELLANEOUS) ×4 IMPLANT
BAG LAPAROSCOPIC 12 15 PORT 16 (BASKET) ×2 IMPLANT
BAG RETRIEVAL 12/15 (BASKET) ×3
BAG RETRIEVAL 12/15MM (BASKET) ×1
CATH FOLEY 2WAY SLVR  5CC 20FR (CATHETERS) ×4
CATH FOLEY 2WAY SLVR 5CC 20FR (CATHETERS) ×4 IMPLANT
CHLORAPREP W/TINT 26ML (MISCELLANEOUS) ×4 IMPLANT
CLIP LIGATING HEM O LOK PURPLE (MISCELLANEOUS) ×8 IMPLANT
CLIP LIGATING HEMO LOK XL GOLD (MISCELLANEOUS) ×8 IMPLANT
CLIP LIGATING HEMO O LOK GREEN (MISCELLANEOUS) ×4 IMPLANT
COVER SURGICAL LIGHT HANDLE (MISCELLANEOUS) ×4 IMPLANT
COVER TIP SHEARS 8 DVNC (MISCELLANEOUS) ×2 IMPLANT
COVER TIP SHEARS 8MM DA VINCI (MISCELLANEOUS) ×2
DECANTER SPIKE VIAL GLASS SM (MISCELLANEOUS) ×4 IMPLANT
DRAPE ARM DVNC X/XI (DISPOSABLE) ×8 IMPLANT
DRAPE COLUMN DVNC XI (DISPOSABLE) ×2 IMPLANT
DRAPE DA VINCI XI ARM (DISPOSABLE) ×8
DRAPE DA VINCI XI COLUMN (DISPOSABLE) ×2
ELECT REM PT RETURN 9FT ADLT (ELECTROSURGICAL) ×4
ELECTRODE REM PT RTRN 9FT ADLT (ELECTROSURGICAL) ×2 IMPLANT
GLOVE BIO SURGEON STRL SZ 6.5 (GLOVE) ×6 IMPLANT
GLOVE BIO SURGEON STRL SZ7.5 (GLOVE) ×12 IMPLANT
GLOVE BIO SURGEONS STRL SZ 6.5 (GLOVE) ×2
GLOVE BIOGEL PI IND STRL 6.5 (GLOVE) ×4 IMPLANT
GLOVE BIOGEL PI INDICATOR 6.5 (GLOVE) ×4
GOWN STRL REUS W/TWL LRG LVL3 (GOWN DISPOSABLE) ×20 IMPLANT
HOLDER FOLEY CATH W/STRAP (MISCELLANEOUS) ×4 IMPLANT
IRRIG SUCT STRYKERFLOW 2 WTIP (MISCELLANEOUS) ×4
IRRIGATION SUCT STRKRFLW 2 WTP (MISCELLANEOUS) ×2 IMPLANT
LIQUID BAND (GAUZE/BANDAGES/DRESSINGS) ×4 IMPLANT
LOOP VESSEL MAXI BLUE (MISCELLANEOUS) ×4 IMPLANT
NDL SAFETY ECLIPSE 18X1.5 (NEEDLE) ×2 IMPLANT
NEEDLE HYPO 18GX1.5 SHARP (NEEDLE) ×2
PACK ROBOT UROLOGY CUSTOM (CUSTOM PROCEDURE TRAY) ×4 IMPLANT
POSITIONER SURGICAL ARM (MISCELLANEOUS) ×8 IMPLANT
RELOAD LINEAR CUT PROX 55 BLUE (ENDOMECHANICALS) ×8 IMPLANT
SEALER TISSUE G2 CVD JAW 45CM (ENDOMECHANICALS) ×4 IMPLANT
SEALER VESSEL DA VINCI XI (MISCELLANEOUS)
SEALER VESSEL EXT DVNC XI (MISCELLANEOUS) IMPLANT
SOL PREP PROV IODINE SCRUB 4OZ (MISCELLANEOUS) IMPLANT
SOLUTION ELECTROLUBE (MISCELLANEOUS) ×4 IMPLANT
SPONGE LAP 18X18 X RAY DECT (DISPOSABLE) ×4 IMPLANT
SPONGE LAP 4X18 X RAY DECT (DISPOSABLE) ×4 IMPLANT
STAPLER GUN LINEAR PROX 60 (STAPLE) ×4 IMPLANT
STAPLER PROXIMATE 55 BLUE (STAPLE) ×8 IMPLANT
STENT SET URETHERAL LEFT 7FR (STENTS) ×4 IMPLANT
STENT SET URETHERAL RIGHT 7FR (STENTS) ×4 IMPLANT
SUT CHROMIC 3 0 SH 27 (SUTURE) ×4 IMPLANT
SUT ETHILON 3 0 PS 1 (SUTURE) ×4 IMPLANT
SUT MNCRL AB 4-0 PS2 18 (SUTURE) ×8 IMPLANT
SUT PDS AB 4-0 RB1 27 (SUTURE) ×12 IMPLANT
SUT SILK 3 0 SH CR/8 (SUTURE) ×4 IMPLANT
SUT VIC AB 0 CT1 27 (SUTURE) ×6
SUT VIC AB 0 CT1 27XBRD ANTBC (SUTURE) ×6 IMPLANT
SUT VIC AB 2-0 CT2 27 (SUTURE) ×4 IMPLANT
SUT VIC AB 3-0 PS2 18 (SUTURE) ×2
SUT VIC AB 3-0 PS2 18XBRD (SUTURE) ×2 IMPLANT
SUT VIC AB 3-0 SH 27 (SUTURE) ×12
SUT VIC AB 3-0 SH 27XBRD (SUTURE) ×12 IMPLANT
SUT VIC AB 4-0 RB1 27 (SUTURE) ×10
SUT VIC AB 4-0 RB1 27XBRD (SUTURE) ×10 IMPLANT
SYR 27GX1/2 1ML LL SAFETY (SYRINGE) ×4 IMPLANT
SYSTEM UROSTOMY GENTLE TOUCH (WOUND CARE) ×4 IMPLANT
TOWEL OR NON WOVEN STRL DISP B (DISPOSABLE) ×4 IMPLANT
TROCAR XCEL 12X100 BLDLESS (ENDOMECHANICALS) ×4 IMPLANT
WATER STERILE IRR 1000ML POUR (IV SOLUTION) ×4 IMPLANT
YANKAUER SUCT BULB TIP 10FT TU (MISCELLANEOUS) ×4 IMPLANT

## 2016-06-28 NOTE — Op Note (Signed)
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  Robot-assisted laparoscopic radical cystectomy 2.  Pelvic lymphadenectomy 3.  Ileal conduit urinary diversion 4.  Robot-assisted laparoscopic adhesiolysis  Surgeon: [name]. M.D.  Assistant: Debbrah Alar, PA-C  Role of assistant: An assistant was required for this surgical procedure.  The duties of the assistant included but were not limited to suctioning, passing suture, camera manipulation, retraction. This procedure would not be able to be performed without an Environmental consultant.  Anesthesia: General  Complications: None  EBL: 100 cc  Intravenous fluids: 2400 cc of crystalloid  Specimens: 1.  Urinary bladder 2.  Right external iliac and common iliac lymph nodes 3.  Right and left distal ureteral margins  Intraoperative findings: Distal right and left ureteral margins were negative for malignancy.  Disposition of specimens: To pathology  Indication: Ronald Lowery is a 75 year old gentleman with a history of prostate cancer status post surgical treatment with a radical prostatectomy.  He subsequently developed urothelial carcinoma of the bladder and has had BCG refractory carcinoma in situ.  After reviewing options for treatment, he elected to proceed with radical cystectomy for treatment.  We discussed alternative options and discussed the potential complexities of surgery related to his prior robotic prostatectomy and bilateral laparoscopic hernia repair.  After a review of the potential risks, complications, and expected recovery process associated with the above procedures, informed consent was obtained  Description of procedure:  The patient was taken to the operating room and a general anesthetic was administered.  He was given preoperative antibiotics, placed in the dorsal lithotomy position, and prepped and  draped in the usual sterile fashion.  A preoperative timeout was performed.  A site was then selected superior to the midline approximately 20 cm from the pubic symphysis for placement of the camera port.  This was placed using a standard open Hassan technique which allowed entry into the peritoneal cavity under direct vision.  An 8 mm da Vinci robotic port was then placed and secured to the skin with a Vicryl suture.  The abdomen was inspected.  There did appear to be adhesions between the sigmoid colon and the surrounding peritoneum in the pelvis and left lower pelvis.  There was a large piece of mesh extending across the lower abdominal wall and into the inguinal region bilaterally and even extending posteriorly on top of the iliac vessels partially.  Fortunately, there did not appear to be any significant bowel adhesions or omental adhesions within the abdominal wall.  The remaining ports were then placed.  8 mm robotic ports were placed in the far left lateral abdominal wall, left abdominal wall, right abdominal wall over the place of his marked urostomy site.  A 12 mm ports placed in the far right lateral abdominal wall and a 5 mm port was placed in the right upper quadrant.  All ports were placed under direct vision without difficulty.  The surgical cart was then docked.  Using sharp dissection, the sigmoid colon was carefully mobilized off the peritoneum laterally where it was densely adherent.  Adhesiolysis was somewhat tedious and took approximately 45-60 minutes of the procedure.  Once the sigmoid colon was dissected off the peritoneum and hernia mesh, attention then turned toward inspection of the left ureter and dissection of the left ureter.  This was identified and appeared to be very medially located off the medial aspect of the common iliac  artery on the left side.  The ureter was able to be freed with combination of sharp and cautery dissection.  The ureter was then dissected down into the  pelvis and down to its insertion into the bladder.  The superior vesical artery was divided between multiple Weck clips.  At this point, the distal ureter was ligated with a Weck clip distally and a second Weck clip with a tag more proximally.  The ureter was then placed up into the abdomen.  Attention then turned to the right side of the pelvis and the sigmoid colon was again mobilized medially allowing exposure of the retroperitoneum and identification of the right ureter.  The right ureter was dissected in a similar fashion as the left side down to the bladder.  Again, clips were used to ligate the distal ureter with a tag left on the proximal clip.  The right ureter was then also placed back up into the abdomen.  A window was created posterior to the sigmoid colon and the left ureter was brought to the right side of the abdomen.  Ureteral margins were then sent for intraoperative frozen section analysis and ultimately returned negative for malignancy.  Attention then turned to the pelvis.  The patient did have a large redundant sigmoid colon that was brought out of the pelvis.  A laparotomy sponge was placed into the surgical field and used to retract the sigmoid colon superiorly.  There were no normal landmarks considering the patient's prior robotic prostatectomy and mesh hernia repair.  I began with dissection posteriorly incising the peritoneum just posterior to the bladder and extending this dissection to the opening in the peritoneum where the ureteral dissection took place.  There was no clear posterior plane but with careful dissection using sharp and blunt dissection necessary, I was able to create a posterior plane.  I then opened the paravesical space between the bladder and pelvic sidewall.  Again, no normal anatomic space existed considering the patient's prior robotic prostatectomy.  However, by filling the bladder, was able to identify the extent of the bladder and with careful sharp and blunt  dissection, I created the lateral paravesical spaces on either side.  This helped to expose the vascular pedicles of the bladder which were then ligated with a combination of Weck clips and bipolar energy with the laparoscopic Enseal.  Once dissection neared the urethra, there was dense adherent tissue with no clear plane again.  As previously mentioned, the patient had previously undergone a robotic prostatectomy.  With careful sharp, blunt, and cautery dissection, and with periodic manipulation of the Foley catheter, the dissection was able to reach the bladder neck where the bladder was divided after the catheter was removed.  The specimen was placed into a 12 mm retrieval bag for later removal.  Inspection of the pelvis revealed no injury of a rectal injury.  I did perform an intraoperative rectal exam to confirm that no injury was noted.  I replaced a new 58 [place] Foley catheter with the balloon blown up to 30 cc and this was left in place as a pelvic drain.  Hemostasis appeared excellent.  Attention then turned to the pelvic lymphadenectomy.  The patient had previously undergone a pelvic lymphadenectomy at the time of his robotic prostatectomy.  However, I did proceed with a more proximal dissection.  This included removal of the external iliac lymph nodes and common iliac lymph nodes up to the level of the bifurcation of the aorta.  There did not  appear to be significant lymph node tissue below the external iliac vein as this was previously removed during the patient's prostatectomy 7 years ago.  These lymph nodes were then removed for permanent pathologic analysis.  I then examined the left pelvic lymph node areas.  The mesh on this side did extend down over the iliac vessels.  There was not significant tissue located in the external iliac or common iliac areas and there was significant desmoplastic reaction and adhesions related to the mesh overlying the vessels.  Based on the patient's low risk for  having lymph node positive disease, it was felt that the risk of undergoing a lymph node dissection on this side would outweigh the potential benefits.  Therefore, the left lymphadenectomy was admitted for this reason.  Attention then turned toward creation of the ileal conduit.  Prior to this step, the 12 mm right lateral abdominal port was closed with 0 Vicryl suture placed laparoscopically.  The tags on the ureteral clips were then grasped with a laparoscopic needle driver and the string on the bag of the 12 mm retrieval bag was brought out through the camera port incision in the midline.  This incision was then carried out in a periumbilical fashion and extended for approximately 8 cm.  This was carried down through the subcutaneous tissues and the midline rectus fascia.  The laparoscopic needle driver directed the distal ureters to the incision and they were brought out the main incision and laid out over the abdominal wall.  I then ran the small bowel and was able to identify the distal ileum and cecum.  The mesentery to the ileum was then transilluminated and an incision was made distally in the avascular plane of [place] to a point approximately 15 cm proximal to the cecum.  The mesentery was cleared off the bowel with a mosquito and the mesentery was divided with the Enseal.  15 cm proximal to this point, another portion of the small intestine was cleared from the underlying mesentery and again the Enseal was used to divide the mesentery allowing for adequate vascular supply to the conduit segment.  The segment for the conduit was then divided from the ileum utilizing the 55 mm Endo GIA.  Attention then turned to the bowel re-anastomosis.  Mayo scissors were used to cut a small opening in the antimesenteric portion of the native ileum.  The 55 mm Endo GIA was then used to reanastomose the ends of the bowel.  [place] were placed on the remaining opening which was then stapled with a TA 60 stapler.   Additional 3-0 silk interrupted sutures were placed to reinforce the anastomosis both in the crotch of the anastomosis and to oversew the staple line in multiple places with Lembert sutures.  The mesenteric opening was then closed with a running 3-0 silk suture.  Attention then turned to the conduit which was placed inferior to the bowel anastomosis.  4-0 Vicryl sutures were placed in the adventitia of the ureters and secured to the proximal portion of the conduit.  The ureters were then opened and spatulated for at least 1 cm.  A small plug in the mucosa of the conduit was then removed and 3 separate interrupted 4-0 Vicryl sutures were used to begin the anastomosis between the ureter and conduit.  Once this was performed for both ureters, ureteral stents were inserted over a guidewire.  The guidewires were then removed and the stents were brought through the conduit and out the distal aspect  of the conduit with the aid of a pediatric Frazier sucker tip.  The ureteroenteric anastomoses were then completed with running 4-0 Vicryl sutures for both the right and left ureter.  This appeared to result in a watertight and tension-free anastomosis.  Attention then turned to creation of the stoma.  This side had been previously marked by the wound ostomy nurses.  The 8 mm robotic port had been placed through this site and this was now opened slightly and a circular area of skin and underlying subcutaneous tissue was removed.  The fascia was then opened in a cruciate manner and the underlying rectus muscle fibers were separated laterally and medially.  The underlying fascia was then also incised in a cruciate manner allowing 2 fingers to be brought through this opening.  Preplaced 3-0 Vicryl sutures were placed into the anterior rectus fascia for later securing to the conduit.  The distal end of the conduit was then grasped with a Babcock clamp and brought up through the ostomy site to the abdomen.  The previously placed  fascial sutures were then secured to the conduit in 3 separate areas with care not to injure the mesentery.  3-0 Vicryl sutures were then used to secure the skin, mucosal edge of the conduit, and a more proximal portion of the conduit to result in a rosebud stoma.  Additional 3-0 Vicryl sutures were used to place interrupted sutures to reanastomose the skin and mucosal edge of the conduit. A #19 Blake drain was then placed to the left lateral abdominal port site and used to drain the ureteroenteric anastomoses.  This was secured to the skin with a 3-0 nylon suture.  Preparations were then made for closure.  The main abdominal wound was then closed at the level of the fascia with a running #1 PDS suture both from the superior and inferior aspect of the wound and tied in the middle.  The wound was copiously irrigated.  The skin was reapproximated at all port sites and the main incision site with 4-0 Monocryl subcuticular sutures.  Liquid band was placed over each of the incision sites.  A urostomy bag was placed over the stoma.  The patient appeared to have excellent urine output.  He tolerated the procedure well without complications.  He was able to be extubated and transferred to the recovery unit in satisfactory condition.

## 2016-06-28 NOTE — Anesthesia Postprocedure Evaluation (Signed)
Anesthesia Post Note  Patient: Ronald Lowery  Procedure(s) Performed: Procedure(s) (LRB): XI ROBOTIC ASSISTED LAPAROSCOPIC COMPLETE CYSTECT ILEAL CONDUIT (N/A) PELVIC LYMPHADENECTOMY (Bilateral)  Patient location during evaluation: PACU Anesthesia Type: General Level of consciousness: awake and alert Pain management: pain level controlled Vital Signs Assessment: post-procedure vital signs reviewed and stable Respiratory status: spontaneous breathing, nonlabored ventilation, respiratory function stable and patient connected to nasal cannula oxygen Cardiovascular status: blood pressure returned to baseline and stable Postop Assessment: no signs of nausea or vomiting Anesthetic complications: no    Last Vitals:  Vitals:   06/28/16 1405 06/28/16 1415  BP: (!) 174/85 (!) 168/84  Pulse: 68 64  Resp: 10 13  Temp: 36.4 C     Last Pain:  Vitals:   06/28/16 1405  TempSrc:   PainSc: 0-No pain                 Montez Hageman

## 2016-06-28 NOTE — Transfer of Care (Signed)
Immediate Anesthesia Transfer of Care Note  Patient: Ronald Lowery  Procedure(s) Performed: Procedure(s): XI ROBOTIC ASSISTED LAPAROSCOPIC COMPLETE CYSTECT ILEAL CONDUIT (N/A) PELVIC LYMPHADENECTOMY (Bilateral)  Patient Location: PACU  Anesthesia Type:General  Level of Consciousness: awake, alert  and oriented  Airway & Oxygen Therapy: Patient Spontanous Breathing and Patient connected to face mask oxygen  Post-op Assessment: Report given to RN and Post -op Vital signs reviewed and stable  Post vital signs: Reviewed and stable  Last Vitals:  Vitals:   06/28/16 0505  BP: 129/71  Pulse: 66  Resp: 18  Temp: 36.6 C    Last Pain:  Vitals:   06/28/16 0505  TempSrc: Oral         Complications: No apparent anesthesia complications

## 2016-06-28 NOTE — Anesthesia Procedure Notes (Signed)
Procedure Name: Intubation Date/Time: 06/28/2016 7:35 AM Performed by: Noralyn Pick D Pre-anesthesia Checklist: Patient identified, Emergency Drugs available, Suction available and Patient being monitored Patient Re-evaluated:Patient Re-evaluated prior to inductionOxygen Delivery Method: Circle system utilized Preoxygenation: Pre-oxygenation with 100% oxygen Intubation Type: IV induction Ventilation: Mask ventilation without difficulty Laryngoscope Size: Mac and 4 Grade View: Grade II Tube type: Oral Tube size: 7.5 mm Number of attempts: 1 Airway Equipment and Method: Stylet Placement Confirmation: ETT inserted through vocal cords under direct vision,  positive ETCO2 and breath sounds checked- equal and bilateral Secured at: 23 cm Tube secured with: Tape Dental Injury: Teeth and Oropharynx as per pre-operative assessment

## 2016-06-28 NOTE — Interval H&P Note (Signed)
History and Physical Interval Note:  06/28/2016 6:35 AM  Ronald Lowery  has presented today for surgery, with the diagnosis of BLADDER CANCER  The various methods of treatment have been discussed with the patient and family. After consideration of risks, benefits and other options for treatment, the patient has consented to  Procedure(s): XI ROBOTIC [place] (N/A) PELVIC LYMPHADENECTOMY (Bilateral) as a surgical intervention .  The patient's history has been reviewed, patient examined, no change in status, stable for surgery.  I have reviewed the patient's chart and labs.  Questions were answered to the patient's satisfaction.     [name],[name]

## 2016-06-29 LAB — BASIC METABOLIC PANEL
Anion gap: 6 (ref 5–15)
BUN: 14 mg/dL (ref 6–20)
CHLORIDE: 101 mmol/L (ref 101–111)
CO2: 28 mmol/L (ref 22–32)
CREATININE: 1.21 mg/dL (ref 0.61–1.24)
Calcium: 8.8 mg/dL — ABNORMAL LOW (ref 8.9–10.3)
GFR calc Af Amer: >60 mL/min (ref >60)
GFR calc non Af Amer: 58 mL/min — ABNORMAL LOW (ref >60)
Glucose, Bld: 124 mg/dL — ABNORMAL HIGH (ref 65–99)
Potassium: 4.2 mmol/L (ref 3.5–5.1)
Sodium: 135 mmol/L (ref 135–145)

## 2016-06-29 LAB — HEMOGLOBIN AND HEMATOCRIT, BLOOD
HCT: 36.4 % — ABNORMAL LOW (ref 39.0–52.0)
Hemoglobin: 12.7 g/dL — ABNORMAL LOW (ref 13.0–17.0)

## 2016-06-29 MED ORDER — ACETAMINOPHEN 10 MG/ML IV SOLN
1000.0000 mg | Freq: Four times a day (QID) | INTRAVENOUS | Status: AC
  Administered 2016-06-29 – 2016-06-30 (×4): 1000 mg via INTRAVENOUS
  Filled 2016-06-29 (×4): qty 100

## 2016-06-29 MED ORDER — BISACODYL 10 MG RE SUPP
10.0000 mg | Freq: Every day | RECTAL | Status: DC
  Administered 2016-06-29 – 2016-06-30 (×2): 10 mg via RECTAL
  Filled 2016-06-29 (×2): qty 1

## 2016-06-29 MED ORDER — HYDROMORPHONE HCL 1 MG/ML IJ SOLN
0.5000 mg | INTRAMUSCULAR | Status: DC | PRN
  Administered 2016-06-29 – 2016-07-02 (×7): 0.5 mg via INTRAVENOUS
  Filled 2016-06-29 (×7): qty 1

## 2016-06-29 MED ORDER — KCL IN DEXTROSE-NACL 20-5-0.45 MEQ/L-%-% IV SOLN
INTRAVENOUS | Status: DC
  Administered 2016-06-29 – 2016-06-30 (×4): via INTRAVENOUS
  Filled 2016-06-29 (×7): qty 1000

## 2016-06-29 NOTE — Progress Notes (Signed)
Pt requested to speak to charge nurse. When charge nurse entered room, pt stated that he would like to finish speaking to the nurse first and the charge nurse should come back. Pt then stated that he did not want to speak to charge nurse until later on. Will continue to monitor pt communication.

## 2016-06-29 NOTE — Progress Notes (Signed)
Patient ID: Ronald Lowery, male   DOB: 1941/03/11, 75 y.o.   MRN: [id number]  1 Day Post-Op Subjective: Pt with some confusion last night regarding PCA.  No major events.  He states that his pain has been controlled.  Has not yet been out of bed.  No nausea or vomiting.  No flatus. Leakage from ostomy appliance the last few hours.  Objective: Vital signs in last 24 hours: Temp:  [97.6 F (36.4 C)-99.3 F (37.4 C)] 98.6 F (37 C) (09/15 0400) Pulse Rate:  [63-68] 68 (09/14 1543) Resp:  [9-19] 13 (09/15 0700) BP: (98-183)/(56-92) 98/57 (09/15 0700) SpO2:  [95 %-100 %] 97 % (09/15 0700) Weight:  [90.2 kg (198 lb 13.7 oz)] 90.2 kg (198 lb 13.7 oz) (09/14 1555)  Intake/Output from previous day: 09/14 0701 - 09/15 0700 In: 3055.4 [I.V.:2905.4; IV Piggyback:150] Out: [id number] [Urine:1075; Drains:350; Blood:100] Intake/Output this shift: Not yet recorded but adequate per nursing staff.    Physical Exam:  General: Alert and oriented CV: RRR Lungs: Clear Abdomen: Soft, ND, Positive BS, urostomy pink and viable, stents in place Incisions: C/D/I Ext: NT, No erythema  Lab Results:  Recent Labs  06/28/16 1439 06/29/16 0356  HGB 13.8 12.7*  HCT 39.5 36.4*   BMET  Recent Labs  06/28/16 1439 06/29/16 0356  NA 134* 135  K 3.9 4.2  CL 98* 101  CO2 27 28  GLUCOSE 134* 124*  BUN 9 14  CREATININE 1.02 1.21  CALCIUM 8.7* 8.8*     Studies/Results: Reviewed KUB last night.  Left ureteral stent noted to have migrated distally but still across anastomosis.  Assessment/Plan: POD # 1 s/p RAL radical cystectomy and ileal conduit urinary diversion - Transfer to floor - Dulcolax suppositories, sips of clears, await return of bowel function - WOC consult to address leakage of ostomy appliance. May benefit from convex appliance. - Ambulate, IS - Monitor renal function, UOP, drain output - Will change to IV push pain meds for breakthrough pain, continue ketorolac, will add IV Tylenol to  reduce need for narcotic pain meds - Leave stents in place for now   LOS: 1 day   Londan Coplen,[name] 06/29/2016, 7:29 AM

## 2016-06-29 NOTE — Care Management Note (Signed)
Case Management Note  Patient Details  Name: Ronald Lowery MRN: [id number] Date of Birth: 06-Apr-1941  Subjective/Objective:       Extensive bladder ca and cystectomy with urostomy formation             Action/Plan: tbd   Expected Discharge Date:                  Expected Discharge Plan:  [place]  In-House Referral:     Discharge planning Services     Post Acute Care Choice:    Choice offered to:     DME Arranged:    DME Agency:     HH Arranged:    HH Agency:     Status of Service:  In process, will continue to follow  If discussed at Long Length of Stay Meetings, dates discussed:    Additional Comments:Date:  June 29, 2016 Chart reviewed for concurrent status and case management needs. Will continue to follow the patient for status change: Discharge Planning: following for needs Expected discharge date: [id number] [name], BSN, [place], [place]  Leeroy Cha, RN 06/29/2016, 10:00 AM

## 2016-06-29 NOTE — Consult Note (Signed)
[place] Nurse ostomy consult note Stoma type/location: RLQ Ileal Conduit.  Pouch change today.  Wife at bedside.  Stomal assessment/size: 1 1/4" round, pink and moist.  Some duskiness at 12 o'clock.  Clear yellow urine in pouch. Pink and red stents in place.   Peristomal assessment: Intact.  Dip from 6 to 9 o'clock to peristomal skin Will use barrier ring to improve fit.  Treatment options for stomal/peristomal skin: 1 piece convex pouch and barrier ring Output Clear yellow urine Ostomy pouching: 1pc.convex pouch  Barrier ring  Education provided: Wife at bedside.  Observed peristomal cleansing, measuring and cutting pouch to fit.  Understands rationale for barrier ring and convex pouch. Explained gold tear drop for open position when emptying and when connecting to bedside drainage.  Encouraged to disconnect from bedside drainage when OOB. Will order additional supplies.  Enrolled patient in [place] program: No WOC team will follow.    Domenic Moras RN BSN [place] Pager [phone]

## 2016-06-29 NOTE — Progress Notes (Signed)
Pt with two assist out of bed and ambulated to door and then to chair. Tolerated fair with c/o mild dizziness. Placed in chair and VSS

## 2016-06-29 NOTE — Progress Notes (Signed)
Dr. Alinda Money via phone updated that Foley Catheter has come out and balloon deflated. Small amount bloody discharge noted from penis. Maintain current plan of care. Pt and Wife updated regarding that MD is aware and the foley will remain out.

## 2016-06-29 NOTE — Progress Notes (Signed)
Pt still confused on use of PCA pump. Teaching provided many times throughout the night. Pt pain still not adequately managed because of lack of understanding of when to push button. Teaching on use, dose, and when to push button was performed. Will continue to monitor.

## 2016-06-29 NOTE — Progress Notes (Signed)
Patient ID: Ronald Lowery, male   DOB: 05/02/1941, 75 y.o.   MRN: [id number]  1 Day Post-Op Subjective: Pt has not yet ambulated.  No flatus.  No nausea.  Pt with recorded decreased UOP but urostomy has been leaking apparently most of the day.  Now with new convex pouch per [place] with improvement in [place].    Objective: Vital signs in last 24 hours: Temp:  [98.5 F (36.9 C)-99.3 F (37.4 C)] 98.5 F (36.9 C) (09/15 1500) Pulse Rate:  [70-73] 70 (09/15 1500) Resp:  [12-19] 16 (09/15 1500) BP: (98-183)/(56-87) 100/66 (09/15 1500) SpO2:  [94 %-100 %] 100 % (09/15 1500)  Intake/Output from previous day: 09/14 0701 - 09/15 0700 In: 3055.4 [I.V.:2905.4; IV Piggyback:150] Out: [id number] [Urine:1075; Drains:350; Blood:100] Intake/Output this shift: Total I/O In: 339.6 [I.V.:239.6; IV Piggyback:100] Out: 370 [Urine:150; Drains:220]  Physical Exam:  General: Alert and oriented but patient appears somewhat somnolent and paranoid about certain aspects of his hospital care. Urostomy viable, draining urine  Lab Results:  Recent Labs  06/28/16 1439 06/29/16 0356  HGB 13.8 12.7*  HCT 39.5 36.4*   BMET  Recent Labs  06/28/16 1439 06/29/16 0356  NA 134* 135  K 3.9 4.2  CL 98* 101  CO2 27 28  GLUCOSE 134* 124*  BUN 9 14  CREATININE 1.02 1.21  CALCIUM 8.7* 8.8*     Studies/Results:  Assessment/Plan: - Ambulate twice tonight and would strongly encourage frequent ambulation over the weekend - Monitor UOP which should pick up with better fitting pouch   LOS: 1 day   Kama Cammarano,[name] 06/29/2016, 5:29 PM

## 2016-06-30 LAB — BASIC METABOLIC PANEL
ANION GAP: 6 (ref 5–15)
BUN: 19 mg/dL (ref 6–20)
CHLORIDE: 100 mmol/L — AB (ref 101–111)
CO2: 26 mmol/L (ref 22–32)
Calcium: 8.4 mg/dL — ABNORMAL LOW (ref 8.9–10.3)
Creatinine, Ser: 1.22 mg/dL (ref 0.61–1.24)
GFR calc non Af Amer: 57 mL/min — ABNORMAL LOW (ref >60)
Glucose, Bld: 118 mg/dL — ABNORMAL HIGH (ref 65–99)
POTASSIUM: 4.3 mmol/L (ref 3.5–5.1)
SODIUM: 132 mmol/L — AB (ref 135–145)

## 2016-06-30 LAB — HEMOGLOBIN AND HEMATOCRIT, BLOOD
HEMATOCRIT: 34.3 % — AB (ref 39.0–52.0)
Hemoglobin: 11.8 g/dL — ABNORMAL LOW (ref 13.0–17.0)

## 2016-06-30 NOTE — Progress Notes (Signed)
Patient ambulated 320 feet in hallway and tolerated well- denied pain and discomfort. Also had a medium loose, watery, brown BM. Denies nausea at this time. Clear diet ordered per MD.

## 2016-06-30 NOTE — Progress Notes (Signed)
Urology Inpatient Progress Report  BLADDER CANCER  Procedure(s): XI ROBOTIC ASSISTED LAPAROSCOPIC COMPLETE CYSTECT ILEAL CONDUIT PELVIC LYMPHADENECTOMY  2 Days Post-Op   Intv/Subj: No acute events overnight. Patient was able to walk to the door yesterday with to assist, mild lightheadedness.  This morning, the patient was able to get to a chair again with some mild weakness. This morning, the patient is complaining of nausea. He has had several burps, but no emesis. He has not had any flatus. The patient is not complaining of any pain.  Active Problems:   Bladder cancer ([place])  Current Facility-Administered Medications  Medication Dose Route Frequency Provider Last Rate Last Dose  . alvimopan (ENTEREG) capsule 12 mg  12 mg Oral BID Raynelle Bring, MD   12 mg at 06/30/16 0847  . bisacodyl (DULCOLAX) suppository 10 mg  10 mg Rectal Daily Raynelle Bring, MD   10 mg at 06/29/16 0945  . dextrose 5 % and 0.45 % NaCl with KCl 20 mEq/L infusion   Intravenous Continuous Raynelle Bring, MD 125 mL/hr at 06/30/16 0849    . diphenhydrAMINE (BENADRYL) injection 12.5 mg  12.5 mg Intravenous Q6H PRN Raynelle Bring, MD       Or  . diphenhydrAMINE (BENADRYL) 12.5 MG/5ML elixir 12.5 mg  12.5 mg Oral Q6H PRN Raynelle Bring, MD      . docusate sodium (COLACE) capsule 100 mg  100 mg Oral BID Raynelle Bring, MD   100 mg at 06/30/16 0847  . enoxaparin (LOVENOX) injection 40 mg  40 mg Subcutaneous Q24H Raynelle Bring, MD   40 mg at 06/30/16 0848  . furosemide (LASIX) tablet 40 mg  40 mg Oral q morning - 10a Raynelle Bring, MD   40 mg at 06/30/16 0846  . gabapentin (NEURONTIN) capsule 400 mg  400 mg Oral QHS Raynelle Bring, MD   400 mg at 06/29/16 2212  . hydrALAZINE (APRESOLINE) injection 5 mg  5 mg Intravenous Q6H PRN Raynelle Bring, MD      . HYDROmorphone (DILAUDID) injection 0.5 mg  0.5 mg Intravenous Q3H PRN Raynelle Bring, MD   0.5 mg at 06/30/16 0848  . ketorolac (TORADOL) 15 MG/ML injection 15 mg  15 mg  Intravenous Q6H Raynelle Bring, MD   15 mg at 06/30/16 0650  . levothyroxine (SYNTHROID, LEVOTHROID) tablet 125 mcg  125 mcg Oral QAC breakfast Raynelle Bring, MD   125 mcg at 06/30/16 0847  . ondansetron (ZOFRAN) injection 4 mg  4 mg Intravenous Q4H PRN Raynelle Bring, MD   4 mg at 06/30/16 0941  . pantoprazole (PROTONIX) EC tablet 40 mg  40 mg Oral Daily Raynelle Bring, MD   40 mg at 06/30/16 0846  . polyvinyl alcohol (LIQUIFILM TEARS) 1.4 % ophthalmic solution 1 drop  1 drop Both Eyes PRN Raynelle Bring, MD         Objective: Vital: Vitals:   06/29/16 1000 06/29/16 1059 06/29/16 1500 06/30/16 0640  BP: 138/63 119/69 100/66 130/79  Pulse:  73 70 (!) 109  Resp: 14 16 16 16   Temp:  98.5 F (36.9 C) 98.5 F (36.9 C) 98.2 F (36.8 C)  TempSrc:  Oral Oral Oral  SpO2: 99% 100% 100% 94%  Weight:      Height:       I/Os: I/O last 3 completed shifts: In: 3444.6 [P.O.:180; I.V.:2864.6; IV Piggyback:400] Out: 1500 [Urine:975; Drains:525]  Physical Exam:  General: Patient is in no apparent distress Lungs: Normal respiratory effort, chest expands symmetrically. GI: Incisions are c/d/i.  The abdomen is soft and Appropriately tender. The patient has faint bowel sounds in the left lower quadrant, bowels very slow moving. JP drain with serosanguinous drainage, there is leakage around the drain with saturation of the patient's gown Patient's ostomy is pink and viable, urine within the urostomy appliance is blood-tinged. Ext: lower extremities symmetric, mild right calf tenderness without appreciable popliteal tenderness or palpable thrombus. Negative Homans sign  Lab Results:  Recent Labs  06/28/16 1439 06/29/16 0356 06/30/16 0531  HGB 13.8 12.7* 11.8*  HCT 39.5 36.4* 34.3*    Recent Labs  06/28/16 1439 06/29/16 0356 06/30/16 0531  NA 134* 135 132*  K 3.9 4.2 4.3  CL 98* 101 100*  CO2 27 28 26   GLUCOSE 134* 124* 118*  BUN 9 14 19   CREATININE 1.02 1.21 1.22  CALCIUM 8.7* 8.8*  8.4*   No results for input(s): LABPT, INR in the last 72 hours. No results for input(s): LABURIN in the last 72 hours. Results for orders placed or performed during the hospital encounter of 06/28/16  MRSA PCR Screening     Status: None   Collection Time: 06/28/16  5:12 PM  Result Value Ref Range Status   MRSA by PCR NEGATIVE NEGATIVE Final    Comment:        The GeneXpert MRSA Assay (FDA approved for NASAL specimens only), is one component of a comprehensive MRSA colonization surveillance program. It is not intended to diagnose MRSA infection nor to guide or monitor treatment for MRSA infections.     Studies/Results: Dg Abd Portable 1v  Result Date: 06/28/2016 CLINICAL DATA:  Bladder cancer post surgery, stent placements EXAM: PORTABLE ABDOMEN - 1 VIEW COMPARISON:  CT abdomen pelvis 04/04/2015 FINDINGS: RIGHT lower quadrant urostomy. RIGHT ureteral stent extends from RIGHT renal pelvis through the ostomy. LEFT ureteral stent extends from the LEFT ureter at the L5 level to the ostomy ; the proximal pigtail of the LEFT ureteral stent is straightened and not located at the expected position of LEFT renal pelvis. Surgical drain within pelvis. Bowel gas pattern normal. Osseous demineralization with degenerative changes and scoliosis of lumbar spine. LEFT hip prosthesis. No definite urinary tract calcification. IMPRESSION: Proximal portion of the LEFT ureteral stent is located within the LEFT ureter at the L5 level, knot at the expected position of the LEFT renal pelvis. RIGHT ureteral stent extends from expected position of RIGHT renal pelvis to the urostomy. Electronically Signed   By: Lavonia Dana M.D.   On: 06/28/2016 15:10    Assessment: Procedure(s): XI ROBOTIC ASSISTED LAPAROSCOPIC COMPLETE CYSTECT ILEAL CONDUIT PELVIC LYMPHADENECTOMY, 2 Days Post-Op  The patient is doing reasonably well, still is not had return of his bowel function. He does have bowel sounds. His nausea is  intermittent.  Plan: - The patient will remain nothing by mouth with ice chips and sips with meds given his nausea and quiet abdomen. This morning I discussed with the patient the importance of ambulation and encourage the patient to try and ambulate several times throughout the day. I also suggested that he consider chewing gum as this may also help with his bowel function. He is on Entereg which we would continue until clear evidence of bowel function has returned. - His current pain medicine regimen appears to be adequate for him maintaining a balance between good pain control and alertness. - The drain dressing will be changed and reinforced today, we will maintain the drain for now. His ostomy appliance was changed and his urine  output is adequate. - PT/OT, SQH, Nexium, ISS.    Louis Meckel, MD Urology 06/30/2016, 11:04 AM

## 2016-07-01 ENCOUNTER — Inpatient Hospital Stay (HOSPITAL_COMMUNITY): Payer: Medicare Other

## 2016-07-01 LAB — BASIC METABOLIC PANEL
Anion gap: 7 (ref 5–15)
BUN: 17 mg/dL (ref 6–20)
CALCIUM: 8.4 mg/dL — AB (ref 8.9–10.3)
CO2: 25 mmol/L (ref 22–32)
CREATININE: 1.17 mg/dL (ref 0.61–1.24)
Chloride: 99 mmol/L — ABNORMAL LOW (ref 101–111)
Glucose, Bld: 112 mg/dL — ABNORMAL HIGH (ref 65–99)
Potassium: 4.1 mmol/L (ref 3.5–5.1)
SODIUM: 131 mmol/L — AB (ref 135–145)

## 2016-07-01 LAB — CREATININE, FLUID (PLEURAL, PERITONEAL, JP DRAINAGE): Creat, Fluid: 1.1 mg/dL

## 2016-07-01 MED ORDER — SODIUM CHLORIDE 0.9% FLUSH: 3.0000 mL | INTRAVENOUS | Status: DC | PRN

## 2016-07-01 MED ORDER — PIPERACILLIN-TAZOBACTAM 3.375 G IVPB 30 MIN
3.3750 g | Freq: Three times a day (TID) | INTRAVENOUS | Status: DC
  Administered 2016-07-01 – 2016-07-02 (×2): 3.375 g via INTRAVENOUS
  Filled 2016-07-01 (×5): qty 50

## 2016-07-01 MED ORDER — OXYCODONE HCL 5 MG PO TABS: 5.0000 mg | ORAL_TABLET | ORAL | Status: DC | PRN

## 2016-07-01 MED ORDER — TRAMADOL HCL 50 MG PO TABS
50.0000 mg | ORAL_TABLET | Freq: Four times a day (QID) | ORAL | Status: DC | PRN
  Administered 2016-07-02 – 2016-07-03 (×2): 100 mg via ORAL
  Filled 2016-07-01 (×2): qty 2

## 2016-07-01 MED ORDER — SODIUM CHLORIDE 0.9 % IV SOLN: 250.0000 mL | INTRAVENOUS | Status: DC | PRN

## 2016-07-01 MED ORDER — SODIUM CHLORIDE 0.9% FLUSH
3.0000 mL | Freq: Two times a day (BID) | INTRAVENOUS | Status: DC
  Administered 2016-07-01: 3 mL via INTRAVENOUS

## 2016-07-01 MED ORDER — ACETAMINOPHEN 500 MG PO TABS
1000.0000 mg | ORAL_TABLET | Freq: Four times a day (QID) | ORAL | Status: DC | PRN
  Administered 2016-07-01 – 2016-07-04 (×2): 1000 mg via ORAL
  Filled 2016-07-01 (×2): qty 2

## 2016-07-01 NOTE — Progress Notes (Signed)
Notified Dr Larinda Buttery re pt's fever. Orders received. Aima Mcwhirt, [employer]

## 2016-07-01 NOTE — Evaluation (Signed)
Physical Therapy Evaluation Patient Details Name: Ronald Lowery MRN: [id number] DOB: [birth year] Today's Date: 07/01/2016   History of Present Illness   ROBOTIC ASSISTED LAPAROSCOPIC COMPLETE CYSTECTomy ILEAL CONDUIT for baldder cancer on 06/28/16  Clinical Impression  The patient is eager to ambulate, felt use of RW was helpful. OK for patient to ambulate w/ RW and family. Pt admitted with above diagnosis. Pt currently with functional limitations due to the deficits listed below (see PT Problem List).  Pt will benefit from skilled PT to increase their independence and safety with mobility to allow discharge to the venue listed below.       Follow Up Recommendations No PT follow up    Equipment Recommendations  None recommended by PT    Recommendations for Other Services       Precautions / Restrictions Precautions Precautions: Fall Precaution Comments: L drain      Mobility  Bed Mobility               General bed mobility comments: in recliner  Transfers Overall transfer level: Needs assistance Equipment used: Rolling walker (2 wheeled) Transfers: Sit to/from Stand Sit to Stand: Supervision            Ambulation/Gait Ambulation/Gait assistance: Supervision Ambulation Distance (Feet): 440 Feet Assistive device: Rolling walker (2 wheeled) Gait Pattern/deviations: Step-through pattern     General Gait Details: cues for  posture  Stairs            Wheelchair Mobility    Modified Rankin (Stroke Patients Only)       Balance                                             Pertinent Vitals/Pain Pain Assessment: 0-10 Pain Score: 0-No pain    Home Living Family/patient expects to be discharged to:: Private residence Living Arrangements: Spouse/significant other Available Help at Discharge: Family   Home Access: Stairs to enter Entrance Stairs-Rails: Right Entrance Stairs-Number of Steps: 6 Home Layout: One level Home  Equipment: Environmental consultant - 2 wheels;Bedside commode      Prior Function Level of Independence: Independent               Hand Dominance        Extremity/Trunk Assessment   Upper Extremity Assessment: Overall WFL for tasks assessed           Lower Extremity Assessment: Generalized weakness      Cervical / Trunk Assessment: Kyphotic  Communication      Cognition Arousal/Alertness: Awake/alert Behavior During Therapy: WFL for tasks assessed/performed Overall Cognitive Status: Impaired/Different from baseline Area of Impairment: Memory               General Comments: fuzzy on when he had THA    General Comments      Exercises     Assessment/Plan    PT Assessment Patient needs continued PT services  PT Problem List Decreased strength;Decreased activity tolerance;Decreased mobility          PT Treatment Interventions DME instruction;Gait training;Functional mobility training;Stair training;Therapeutic activities;Patient/family education    PT Goals (Current goals can be found in the Care Plan section)  Acute Rehab PT Goals Patient Stated Goal: to walk more PT Goal Formulation: With patient/family Time For Goal Achievement: 07/08/16 Potential to Achieve Goals: Good    Frequency Min 3X/week   Barriers to discharge  Co-evaluation               End of Session   Activity Tolerance: Patient tolerated treatment well Patient left: in chair;with call bell/phone within reach;with family/visitor present Nurse Communication: Mobility status         Time: [id number] PT Time Calculation (min) (ACUTE ONLY): 10 min   Charges:   PT Evaluation $PT Eval Low Complexity: 1 Procedure     PT G CodesClaretha Cooper 07/01/2016, 10:06 AM Tresa Endo PT [phone]

## 2016-07-01 NOTE — Progress Notes (Signed)
Urology Inpatient Progress Report  BLADDER CANCER  Procedure(s): XI ROBOTIC ASSISTED LAPAROSCOPIC COMPLETE CYSTECT ILEAL CONDUIT PELVIC LYMPHADENECTOMY  3 Days Post-Op   Intv/Subj: - No acute events overnight - Patient had several bowel movements yesterday afternoon. His diet was advanced to clear liquids which - He tolerated without nausea/vomiting. - He has been working with physical therapy and walking with the nursing staff. Today, he was able to make 2 laps around the hall, he did this twice already. - The patient's complaining of no pain, has continued to get Toradol on a scheduled basis.  - Ostomy was changed last night with the nurse and daughter-in-law. - The patient has worked with physical therapy who has signed off on him.   Active Problems:   Bladder cancer ([place])  Current Facility-Administered Medications  Medication Dose Route Frequency Provider Last Rate Last Dose  . acetaminophen (TYLENOL) tablet 1,000 mg  1,000 mg Oral Q6H PRN Ardis Hughs, MD      . diphenhydrAMINE (BENADRYL) injection 12.5 mg  12.5 mg Intravenous Q6H PRN Raynelle Bring, MD       Or  . diphenhydrAMINE (BENADRYL) 12.5 MG/5ML elixir 12.5 mg  12.5 mg Oral Q6H PRN Raynelle Bring, MD      . docusate sodium (COLACE) capsule 100 mg  100 mg Oral BID Raynelle Bring, MD   100 mg at 07/01/16 1018  . enoxaparin (LOVENOX) injection 40 mg  40 mg Subcutaneous Q24H Raynelle Bring, MD   40 mg at 07/01/16 0845  . furosemide (LASIX) tablet 40 mg  40 mg Oral q morning - 10a Raynelle Bring, MD   40 mg at 07/01/16 1018  . gabapentin (NEURONTIN) capsule 400 mg  400 mg Oral QHS Raynelle Bring, MD   400 mg at 06/30/16 2234  . hydrALAZINE (APRESOLINE) injection 5 mg  5 mg Intravenous Q6H PRN Raynelle Bring, MD      . HYDROmorphone (DILAUDID) injection 0.5 mg  0.5 mg Intravenous Q3H PRN Raynelle Bring, MD   0.5 mg at 06/30/16 0848  . levothyroxine (SYNTHROID, LEVOTHROID) tablet 125 mcg  125 mcg Oral QAC breakfast Raynelle Bring, MD   125 mcg at 07/01/16 0845  . ondansetron (ZOFRAN) injection 4 mg  4 mg Intravenous Q4H PRN Raynelle Bring, MD   4 mg at 06/30/16 0941  . pantoprazole (PROTONIX) EC tablet 40 mg  40 mg Oral Daily Raynelle Bring, MD   40 mg at 07/01/16 1018  . polyvinyl alcohol (LIQUIFILM TEARS) 1.4 % ophthalmic solution 1 drop  1 drop Both Eyes PRN Raynelle Bring, MD      . traMADol Veatrice Bourbon) tablet 50-100 mg  50-100 mg Oral Q6H PRN Ardis Hughs, MD         Objective: Vital: Vitals:   06/30/16 0640 06/30/16 1524 06/30/16 2038 07/01/16 0436  BP: 130/79 138/79 127/74 134/72  Pulse: (!) 109 82 83   Resp: 16 16 18 16   Temp: 98.2 F (36.8 C) 99.1 F (37.3 C) 97.8 F (36.6 C) 98.2 F (36.8 C)  TempSrc: Oral Oral Oral Oral  SpO2: 94% 95% 96% 96%  Weight:      Height:       I/Os: I/O last 3 completed shifts: In: 2755.8 [P.O.:180; I.V.:2475.8; IV Piggyback:100] Out: 2790 [Urine:2500; Drains:290]  Physical Exam:  General: Patient is in no apparent distress Lungs: Normal respiratory effort, chest expands symmetrically. GI: Incisions are c/d/i.  The abdomen is soft and Appropriately tender.  JP drain with serosanguinous drainage Patient's ostomy is  pink and viable, urine within the urostomy appliance is blood-tinged. Ext: lower extremities symmetric, mild right calf tenderness without appreciable popliteal tenderness or palpable thrombus. Negative Homans sign  Lab Results:  Recent Labs  06/28/16 1439 06/29/16 0356 06/30/16 0531  HGB 13.8 12.7* 11.8*  HCT 39.5 36.4* 34.3*    Recent Labs  06/29/16 0356 06/30/16 0531 07/01/16 0507  NA 135 132* 131*  K 4.2 4.3 4.1  CL 101 100* 99*  CO2 28 26 25   GLUCOSE 124* 118* 112*  BUN 14 19 17   CREATININE 1.21 1.22 1.17  CALCIUM 8.8* 8.4* 8.4*   No results for input(s): LABPT, INR in the last 72 hours. No results for input(s): LABURIN in the last 72 hours. Results for orders placed or performed during the hospital encounter of  06/28/16  MRSA PCR Screening     Status: None   Collection Time: 06/28/16  5:12 PM  Result Value Ref Range Status   MRSA by PCR NEGATIVE NEGATIVE Final    Comment:        The GeneXpert MRSA Assay (FDA approved for NASAL specimens only), is one component of a comprehensive MRSA colonization surveillance program. It is not intended to diagnose MRSA infection nor to guide or monitor treatment for MRSA infections.     Studies/Results: No results found.  Assessment: Procedure(s): XI ROBOTIC ASSISTED LAPAROSCOPIC COMPLETE CYSTECT ILEAL CONDUIT PELVIC LYMPHADENECTOMY, 3 Days Post-Op  bowel function has returned. The patient is tolerating diet advance. His strength is improving, he is able to ambulate around the hall with a front wheel walker.  Plan: -  Entereg has been discontinued, patient's diet has been advanced to a regular diet, his IV fluid has been discontinued. -  We have transitioned him to oral pain medication. We will start with tramadol and Tylenol and see if this is adequate for him, if not we will bump him up to oxycodone. -  The patient is getting close to discharge. The family is expecting one more visit from the ostomy nurse to reinforce the stoma care. - Lovenox, Nexium, ISS.    Louis Meckel, MD Urology 07/01/2016, 1:01 PM

## 2016-07-02 LAB — CBC WITH DIFFERENTIAL/PLATELET
BASOS ABS: 0 10*3/uL (ref 0.0–0.1)
Basophils Relative: 0 %
EOS ABS: 0.1 10*3/uL (ref 0.0–0.7)
Eosinophils Relative: 2 %
HCT: 34.2 % — ABNORMAL LOW (ref 39.0–52.0)
HEMOGLOBIN: 11.8 g/dL — AB (ref 13.0–17.0)
Lymphocytes Relative: 14 %
Lymphs Abs: 1.2 10*3/uL (ref 0.7–4.0)
MCH: 32 pg (ref 26.0–34.0)
MCHC: 34.5 g/dL (ref 30.0–36.0)
MCV: 92.7 fL (ref 78.0–100.0)
MONOS PCT: 8 %
Monocytes Absolute: 0.7 10*3/uL (ref 0.1–1.0)
NEUTROS ABS: 7 10*3/uL (ref 1.7–7.7)
NEUTROS PCT: 76 %
PLATELETS: 235 10*3/uL (ref 150–400)
RBC: 3.69 MIL/uL — AB (ref 4.22–5.81)
RDW: 12.8 % (ref 11.5–15.5)
WBC: 9.1 10*3/uL (ref 4.0–10.5)

## 2016-07-02 LAB — COMPREHENSIVE METABOLIC PANEL
ALBUMIN: 3 g/dL — AB (ref 3.5–5.0)
ALK PHOS: 59 U/L (ref 38–126)
ALT: 13 U/L — AB (ref 17–63)
AST: 18 U/L (ref 15–41)
Anion gap: 9 (ref 5–15)
BUN: 14 mg/dL (ref 6–20)
CALCIUM: 8.6 mg/dL — AB (ref 8.9–10.3)
CHLORIDE: 98 mmol/L — AB (ref 101–111)
CO2: 24 mmol/L (ref 22–32)
CREATININE: 0.93 mg/dL (ref 0.61–1.24)
GFR calc Af Amer: >60 mL/min (ref >60)
GFR calc non Af Amer: >60 mL/min (ref >60)
GLUCOSE: 132 mg/dL — AB (ref 65–99)
Potassium: 3.9 mmol/L (ref 3.5–5.1)
SODIUM: 131 mmol/L — AB (ref 135–145)
Total Bilirubin: 1.5 mg/dL — ABNORMAL HIGH (ref 0.3–1.2)
Total Protein: 6.2 g/dL — ABNORMAL LOW (ref 6.5–8.1)

## 2016-07-02 MED ORDER — KCL IN DEXTROSE-NACL 20-5-0.45 MEQ/L-%-% IV SOLN
INTRAVENOUS | Status: DC
  Filled 2016-07-02: qty 1000

## 2016-07-02 MED ORDER — LOSARTAN POTASSIUM 50 MG PO TABS
50.0000 mg | ORAL_TABLET | Freq: Every day | ORAL | Status: DC
  Administered 2016-07-02 – 2016-07-05 (×4): 50 mg via ORAL
  Filled 2016-07-02 (×4): qty 1

## 2016-07-02 MED ORDER — LIP MEDEX EX OINT
TOPICAL_OINTMENT | CUTANEOUS | Status: DC | PRN
  Administered 2016-07-02: 23:00:00 via TOPICAL
  Filled 2016-07-02: qty 7

## 2016-07-02 MED ORDER — KCL IN DEXTROSE-NACL 20-5-0.9 MEQ/L-%-% IV SOLN
INTRAVENOUS | Status: DC
  Administered 2016-07-02 – 2016-07-04 (×3): via INTRAVENOUS
  Filled 2016-07-02 (×5): qty 1000

## 2016-07-02 NOTE — Consult Note (Signed)
[facility] Nurse ostomy follow up Stoma type/location: RLQ ileal conduit Visit deferred until tomorrow for pouch change. Noted that pouch applied on Friday leaked on Saturday night.  Will use convex pouching system with tomorrow's change.  Patient had increased temp overnight and nausea today. Enrolled patient in [place] Start Discharge program: No WOC nursing team will follow, and will remain available to this patient, the nursing and medical teams.   Thanks, Maudie Flakes, MSN, RN, [place], Arther Abbott  Pager# [phone]

## 2016-07-02 NOTE — Progress Notes (Signed)
Spoke with pt's wife concerning HH needs and [place] agencies. She had no preference. Referral given to [facility] for [facility] for teaching and [place].  Will need MD to place face to face order please.

## 2016-07-02 NOTE — Progress Notes (Signed)
Patient ID: Ronald Lowery, male   DOB: 1940-12-15, 75 y.o.   MRN: [id number]  4 Days Post-Op Subjective: Pt made significant progress over the weekend and was ambulating well and tolerating regular diet.  However, he developed fever to 103 last night along with increased nausea.  He denies any vomiting.  He continues to pass flatus.  He has not had a normal formed bowel movement. Entereg stopped yesterday.  Denies incisional pain.   Objective: Vital signs in last 24 hours: Temp:  [99 F (37.2 C)-103.2 F (39.6 C)] 99.3 F (37.4 C) (09/18 0429) Pulse Rate:  [75-98] 75 (09/18 0429) Resp:  [18] 18 (09/17 2131) BP: (126-156)/(77-83) 156/77 (09/18 0429) SpO2:  [96 %-97 %] 97 % (09/18 0429)  Intake/Output from previous day: 09/17 0701 - 09/18 0700 In: 660 [P.O.:560; IV Piggyback:100] Out: 1520 [Urine:1150; Drains:370] Intake/Output this shift: No intake/output data recorded.  Physical Exam:  General: Alert and oriented CV: RRR Lungs: Clear Abdomen: Soft, ND, NT, minimal BS Urostomy: Pink and viable Incisions: C/D/I Ext: NT, No erythema  Lab Results:  Recent Labs  06/30/16 0531 07/02/16 0742  HGB 11.8* 11.8*  HCT 34.3* 34.2*   CBC Latest Ref Rng & Units 07/02/2016 06/30/2016 06/29/2016  WBC 4.0 - 10.5 K/uL 9.1 - -  Hemoglobin 13.0 - 17.0 g/dL 11.8(L) 11.8(L) 12.7(L)  Hematocrit 39.0 - 52.0 % 34.2(L) 34.3(L) 36.4(L)  Platelets 150 - 400 K/uL 235 - -     BMET  Recent Labs  07/01/16 0507 07/02/16 0742  NA 131* 131*  K 4.1 3.9  CL 99* 98*  CO2 25 24  GLUCOSE 112* 132*  BUN 17 14  CREATININE 1.17 0.93  CALCIUM 8.4* 8.6*     Studies/Results: Dg Chest 2 View  Result Date: 07/02/2016 CLINICAL DATA:  Fever. Shortness of breath on physical activity. 3 days post robot assisted laparoscopic ileal conduit. EXAM: CHEST  2 VIEW COMPARISON:  05/03/2015 FINDINGS: Patient is rotated. Shallow inspiration with atelectasis in the lung bases. Normal heart size and pulmonary  vascularity. No focal airspace disease or consolidation in the lungs. No blunting of costophrenic angles. No pneumothorax. Mediastinal contours appear intact. Degenerative changes in the spine and shoulders. IMPRESSION: Shallow inspiration with atelectasis in the lung bases. Electronically Signed   By: Lucienne Capers M.D.   On: 07/02/2016 01:34   Drain Cr 1.1   Assessment/Plan: POD # 4 s/p radical cystectomy and ileal conduit urinary diversion 1) Fever - Cultures pending.  No clear source right now. WBC normal.  Could be atelectasis.  Encourage ambulation and IS.  Will stop Zosyn and observe. 2) GI - Will continue diet as tolerated.  Anti-emetics as needed.  Restart IVF. 3) DVT prophylaxis - Continue SCDs and Lovenox.  Will need to continue Lovenox 40 mg [place] daily for 30 days post-op. 4) Wound care - Patient and wife have done well with teaching for urostomy pouch.  Continue education with [facility] nurse inpatient and will arrange home health for continued education at home. 5) Discharge - Pending resolution of above issues.    LOS: 4 days   Makisha Marrin,[name] 07/02/2016, 8:28 AM

## 2016-07-02 NOTE — Progress Notes (Signed)
Pt resting, does not want to ambulate at this time.  Walked with PT this afternoon but not as far as yesterday due to nausea.  Will continue to encourage ambulation.

## 2016-07-02 NOTE — Care Management Important Message (Signed)
Important Message  Patient Details  Name: Ronald Lowery MRN: [id number] Date of Birth: 09/08/1941   Medicare Important Message Given:  Yes    Camillo Flaming 07/02/2016, 10:37 AMImportant Message  Patient Details  Name: Ronald Lowery MRN: [id number] Date of Birth: 1941-02-04   Medicare Important Message Given:  Yes    Camillo Flaming 07/02/2016, 10:36 AM

## 2016-07-02 NOTE — Progress Notes (Signed)
Physical Therapy Treatment Patient Details Name: Ronald Lowery MRN: [id number] DOB: 1941/03/21 Today's Date: 07/02/2016    History of Present Illness  ROBOTIC ASSISTED LAPAROSCOPIC COMPLETE CYSTECTomy ILEAL CONDUIT for baldder cancer on 06/28/16    PT Comments    Progressing with mobility. Pt has been experiencing nausea on today so he didn't want to walk as far as he had last session.   Follow Up Recommendations  No PT follow up     Equipment Recommendations  None recommended by PT    Recommendations for Other Services       Precautions / Restrictions Precautions Precautions: Fall Precaution Comments: L drain, urostomy Restrictions Weight Bearing Restrictions: No    Mobility  Bed Mobility Overal bed mobility: Needs Assistance Bed Mobility: Supine to Sit;Sit to Supine     Supine to sit: Min assist Sit to supine: Min assist   General bed mobility comments: wife assisted with getting pt to EOB and getting [name] back onto bed  Transfers Overall transfer level: Needs assistance Equipment used: Rolling walker (2 wheeled) Transfers: Sit to/from Stand Sit to Stand: Min guard         General transfer comment: close guard for safety. VCs safety  Ambulation/Gait Ambulation/Gait assistance: Min guard Ambulation Distance (Feet): 125 Feet Assistive device: Rolling walker (2 wheeled) Gait Pattern/deviations: Step-through pattern;Trunk flexed     General Gait Details: cues for  posture, safe distance from RW. slow gait speed. close guard for safety   Stairs            Wheelchair Mobility    Modified Rankin (Stroke Patients Only)       Balance                                    Cognition Arousal/Alertness: Awake/alert Behavior During Therapy: WFL for tasks assessed/performed Overall Cognitive Status: Impaired/Different from baseline                      Exercises      General Comments        Pertinent Vitals/Pain Pain  Assessment: No/denies pain    Home Living                      Prior Function            PT Goals (current goals can now be found in the care plan section) Progress towards PT goals: Progressing toward goals    Frequency    Min 3X/week      PT Plan Current plan remains appropriate    Co-evaluation             End of Session   Activity Tolerance: Patient tolerated treatment well Patient left: with call bell/phone within reach;with bed alarm set;with family/visitor present     Time: [id number] PT Time Calculation (min) (ACUTE ONLY): 24 min  Charges:  $Gait Training: 8-22 mins                    G Codes:      Ronald Lowery, MPT Pager: [phone] \

## 2016-07-03 LAB — CBC
HEMATOCRIT: 29.1 % — AB (ref 39.0–52.0)
Hemoglobin: 10 g/dL — ABNORMAL LOW (ref 13.0–17.0)
MCH: 30.9 pg (ref 26.0–34.0)
MCHC: 34.4 g/dL (ref 30.0–36.0)
MCV: 89.8 fL (ref 78.0–100.0)
Platelets: 236 10*3/uL (ref 150–400)
RBC: 3.24 MIL/uL — ABNORMAL LOW (ref 4.22–5.81)
RDW: 12.6 % (ref 11.5–15.5)
WBC: 7 10*3/uL (ref 4.0–10.5)

## 2016-07-03 LAB — BASIC METABOLIC PANEL
Anion gap: 8 (ref 5–15)
BUN: 10 mg/dL (ref 6–20)
CALCIUM: 8.2 mg/dL — AB (ref 8.9–10.3)
CO2: 25 mmol/L (ref 22–32)
Chloride: 99 mmol/L — ABNORMAL LOW (ref 101–111)
Creatinine, Ser: 0.7 mg/dL (ref 0.61–1.24)
GFR calc Af Amer: >60 mL/min (ref >60)
GLUCOSE: 109 mg/dL — AB (ref 65–99)
Potassium: 3.5 mmol/L (ref 3.5–5.1)
Sodium: 132 mmol/L — ABNORMAL LOW (ref 135–145)

## 2016-07-03 MED ORDER — METOCLOPRAMIDE HCL 5 MG/ML IJ SOLN
5.0000 mg | Freq: Four times a day (QID) | INTRAMUSCULAR | Status: DC
  Administered 2016-07-03 – 2016-07-05 (×7): 5 mg via INTRAVENOUS
  Filled 2016-07-03 (×8): qty 2

## 2016-07-03 NOTE — Consult Note (Signed)
[facility] Nurse ostomy follow up Stoma type/location: RLQ urostomy (ileal conduit) Stomal assessment/size: 1 and 3/8 inches round, red, moist, budded with significant creasing at 5 and 7-8 o'clock Peristomal assessment: Erythematous, but no denuded area from 6-9 o'clock from previous episodes of leakage. Treatment options for stomal/peristomal skin: placement of 1.5 skin barrier rings to fill defect from 6-9 o'clock Output: dark yellow, clear urine in tubing and in new pouch Ostomy pouching: 1pc.convex urostomy pouch with 1/5 skin barrier rings used today.  One circumferentially around stoma and 1/2 placed from 6-9 o'clock  Education provided: Patient's wife is in room watching and assisting (while writing notes) on ostomy pouch change procedure.  Both patient and wife are anxious about the two episodes of leakage experienced this past weekend.  Understand the challenge of matching body contour to pouching products, but still and understandably seeking a few days of sustained success. Patient and wife now have their own ostomy teaching booklets, wife has one-page teaching sheet for application of a one-piece pouch.  Patient would benefit from another day in house for continued teaching. Next planned visit is tomorrow, 07/04/16. Enrolled patient in [place] Start Discharge program: No [facility] nursing team will follow, and will remain available to this patient, the nursing and medical teams.  Thanks, Maudie Flakes, MSN, RN, [place], Arther Abbott  Pager# [phone]

## 2016-07-03 NOTE — Progress Notes (Signed)
Physical Therapy Treatment Patient Details Name: MAURICIO DUFRENE MRN: [id number] DOB: July 09, 1941 Today's Date: 07/03/2016    History of Present Illness  ROBOTIC ASSISTED LAPAROSCOPIC COMPLETE CYSTECTomy ILEAL CONDUIT for baldder cancer on 06/28/16    PT Comments    POD # 5 Very supportive spouse in room giving pt a bath.  Assisted with amb a greater distance in hallway and practiced stairs.  Pt progressing well.   Follow Up Recommendations  No PT follow up (has a very supportive spouse)     Equipment Recommendations  None recommended by PT    Recommendations for Other Services       Precautions / Restrictions Precautions Precautions: Fall Precaution Comments: L drain, urostomy Restrictions Weight Bearing Restrictions: No    Mobility  Bed Mobility               General bed mobility comments: pt OOB in recliner  Transfers Overall transfer level: Needs assistance Equipment used: Rolling walker (2 wheeled) Transfers: Sit to/from Stand Sit to Stand: Min guard         General transfer comment: close guard for safety. VCs safety  Ambulation/Gait Ambulation/Gait assistance: Min guard;Supervision Ambulation Distance (Feet): 500 Feet Assistive device: Rolling walker (2 wheeled) Gait Pattern/deviations: Step-through pattern;Decreased stride length;Trunk flexed Gait velocity: decreased   General Gait Details: cues for  posture, safe distance from RW. slow gait speed. close guard for safety   Stairs Stairs: Yes Stairs assistance: Min guard Stair Management: One rail Right;Step to pattern;Forwards Number of Stairs: 4 General stair comments: <25% VC's on safety.  Pt tolerated well.   Wheelchair Mobility    Modified Rankin (Stroke Patients Only)       Balance                                    Cognition Arousal/Alertness: Awake/alert Behavior During Therapy: WFL for tasks assessed/performed Overall Cognitive Status: Within Functional  Limits for tasks assessed                      Exercises      General Comments        Pertinent Vitals/Pain Pain Assessment: Faces Faces Pain Scale: Hurts little more Pain Location: ABD with activity Pain Descriptors / Indicators: Grimacing;Discomfort Pain Intervention(s): Monitored during session;Repositioned    Home Living                      Prior Function            PT Goals (current goals can now be found in the care plan section) Progress towards PT goals: Progressing toward goals    Frequency    Min 3X/week      PT Plan Current plan remains appropriate    Co-evaluation             End of Session Equipment Utilized During Treatment: Gait belt Activity Tolerance: Patient tolerated treatment well Patient left: with call bell/phone within reach;with family/visitor present;in chair;with chair alarm set     Time: 1106-1130 PT Time Calculation (min) (ACUTE ONLY): 24 min  Charges:  $Gait Training: 8-22 mins $Therapeutic Activity: 8-22 mins                    G Codes:      Rica Koyanagi  PTA [facility]  Acute  Rehab Pager      [phone]

## 2016-07-03 NOTE — Progress Notes (Signed)
Patient ID: Ronald Lowery, male   DOB: 08-28-1941, 75 y.o.   MRN: [id number]  5 Days Post-Op Subjective: Pt with decreased nausea overnight.  He has had multiple bowel movements.  Due to watery nature of bowel movements, he was automatically placed on isolation contact precautions per hospital policy. He did not ambulate much last night. No fever in > 24 hours off antibiotics.  Is and Os not recorded for last shift.  Objective: Vital signs in last 24 hours: Temp:  [97.4 F (36.3 C)-98.9 F (37.2 C)] 97.4 F (36.3 C) (09/19 0628) Pulse Rate:  [71-78] 72 (09/19 0628) Resp:  [18-20] 18 (09/19 0628) BP: (127-145)/(72-81) 145/81 (09/19 0628) SpO2:  [96 %-98 %] 97 % (09/19 0628)  Intake/Output from previous day: 09/18 0701 - 09/19 0700 In: -  Out: 1322 [Urine:975; Drains:347] Intake/Output this shift: Total I/O In: -  Out: 100 [Drains:100]  Physical Exam:  General: Alert and oriented CV: RRR Lungs: Clear Abdomen: Soft, ND, Mild bowel sounds Incisions: C/D/I Ext: NT, No erythema  Lab Results:  Recent Labs  07/02/16 0742 07/03/16 0403  HGB 11.8* 10.0*  HCT 34.2* 29.1*   CBC Latest Ref Rng & Units 07/03/2016 07/02/2016 06/30/2016  WBC 4.0 - 10.5 K/uL 7.0 9.1 -  Hemoglobin 13.0 - 17.0 g/dL 10.0(L) 11.8(L) 11.8(L)  Hematocrit 39.0 - 52.0 % 29.1(L) 34.2(L) 34.3(L)  Platelets 150 - 400 K/uL 236 235 -     BMET  Recent Labs  07/02/16 0742 07/03/16 0403  NA 131* 132*  K 3.9 3.5  CL 98* 99*  CO2 24 25  GLUCOSE 132* 109*  BUN 14 10  CREATININE 0.93 0.70  CALCIUM 8.6* 8.2*     Studies/Results: Urine culture from 9/17 - GNRs Blood cultures pending.  Pathology pending.  Assessment/Plan: POD # 5 s/p robotic radical cystectomy and ileal conduit urinary diversion - Strongly encouraged patient to ambulate 4-6 times today, IS, DVT prophylaxis - Will restart clear liquids, on Reglan, will d/c suppositories, continue IVF until improved po intake - Pt being tested for C.  Diff per hospital policy although my clinical suspicion is low considering his clinical exam and history. Will f/u on cultures. - [facility] nurse to see patient today, continue education with pouch changes/management - Urine culture likely reflective of expected bacterial colonization of urine after urinary diversion. In absence of signs of clinical infection, will not treat. - Pt needs to ambulate better and improve po intake/improve bowel function prior to discharge.  Arrangements in progress to set up home health for continued urostomy care/education and Lovenox injections (needs for 30 days from surgery date).   LOS: 5 days   Dyson Sevey,[name] 07/03/2016, 7:44 AM

## 2016-07-03 NOTE — Progress Notes (Addendum)
Enteric Precautions D/C'd per protocol. Patient has had several loose, watery BMs, but is also receiving stool softeners and laxatives to assist with him having a BM. Will continue to monitor. Also spoke with Dr. Karie Georges who agrees to D/C Enteric Precautions at this time and reassess status this afternoon.

## 2016-07-03 NOTE — Progress Notes (Signed)
Pt placed on enteric precautions due to pt saying he has had more than 3 loose watery stools today.   Hat placed in commode and spec cup placed in restroom for collection.  This nurse explained to pt why intervention was placed

## 2016-07-04 LAB — BASIC METABOLIC PANEL
Anion gap: 7 (ref 5–15)
BUN: 7 mg/dL (ref 6–20)
CHLORIDE: 96 mmol/L — AB (ref 101–111)
CO2: 28 mmol/L (ref 22–32)
Calcium: 8.4 mg/dL — ABNORMAL LOW (ref 8.9–10.3)
Creatinine, Ser: 0.81 mg/dL (ref 0.61–1.24)
GFR calc Af Amer: >60 mL/min (ref >60)
GFR calc non Af Amer: >60 mL/min (ref >60)
GLUCOSE: 101 mg/dL — AB (ref 65–99)
POTASSIUM: 3.5 mmol/L (ref 3.5–5.1)
Sodium: 131 mmol/L — ABNORMAL LOW (ref 135–145)

## 2016-07-04 LAB — URINE CULTURE: Culture: >=100000 — AB

## 2016-07-04 MED ORDER — BISACODYL 10 MG RE SUPP
10.0000 mg | Freq: Once | RECTAL | Status: AC
  Administered 2016-07-04: 10 mg via RECTAL
  Filled 2016-07-04: qty 1

## 2016-07-04 MED ORDER — ENOXAPARIN SODIUM 40 MG/0.4ML ~~LOC~~ SOLN: 40.0000 mg | SUBCUTANEOUS | 0 refills | Status: DC

## 2016-07-04 MED ORDER — TRAMADOL HCL 50 MG PO TABS: 50.0000 mg | ORAL_TABLET | Freq: Four times a day (QID) | ORAL | 0 refills | Status: DC | PRN

## 2016-07-04 NOTE — Progress Notes (Addendum)
Patient ID: Ronald Lowery, male   DOB: 1941-03-25, 75 y.o.   MRN: [id number]  6 Days Post-Op Subjective: Pt improved.  No nausea over the past 24 hours.  Tolerated clears . Passing flatus and having bowel movements. Ambulating better.    Objective: Vital signs in last 24 hours: Temp:  [97.9 F (36.6 C)-99 F (37.2 C)] 98.7 F (37.1 C) (09/20 0517) Pulse Rate:  [72-76] 75 (09/20 0517) Resp:  [16-18] 16 (09/20 0517) BP: (125-137)/(71-79) 130/79 (09/20 0517) SpO2:  [94 %-96 %] 94 % (09/20 0517)  Intake/Output from previous day: 09/19 0701 - 09/20 0700 In: 3416.3 [P.O.:600; I.V.:2816.3] Out: 2960 [Urine:2800; Drains:160] Intake/Output this shift: No intake/output data recorded.  Physical Exam:  General: Alert and oriented CV: RRR Lungs: Clear Abdomen: Soft, ND, Still with minimal BS. Incisions: C/D/I Urostomy: Pink and viable Ext: NT, No erythema  Lab Results:  Recent Labs  07/02/16 0742 07/03/16 0403  HGB 11.8* 10.0*  HCT 34.2* 29.1*   BMET  Recent Labs  07/03/16 0403 07/04/16 0407  NA 132* 131*  K 3.5 3.5  CL 99* 96*  CO2 25 28  GLUCOSE 109* 101*  BUN 10 7  CREATININE 0.70 0.81  CALCIUM 8.2* 8.4*     Studies/Results: Pathology: pTis No Mx  Assessment/Plan: POD # 6 s/p radical cystectomy and ileal conduit urinary diversion - SL IVF - Regular diet - Ambulate 6 times today - Discussed pathology report, prognosis is excellent - Dulcolax suppository - Continued urostomy teaching ([facility] nurse to see patient one more time today) - Discharge planning for Lovenox/home health nursing ostomy care, possible d/c home tomorrow if does well for next 24 hours    LOS: 6 days   Fallan Mccarey,[name] 07/04/2016, 7:40 AM

## 2016-07-04 NOTE — Consult Note (Signed)
[facility] Nurse ostomy follow up Stoma type/location: RLQ Urostomy Stomal assessment/size: 1 and 3/8 inches round.  Stents in place.  Pouch applied yesterday in intact Peristomal assessment: not seen today Treatment options for stomal/peristomal skin: Skin barrier ring in place Output Clear yellow urine Ostomy pouching: 1pc. Convex pouch with skin barrier ring (1 and 1/2)  Education provided: Extended session with patient and wife to discuss disconnecting and reconnecting from bedside urinary drainage bag, return demo received.  Discussed how to clean bedside drainage bag, maintenance.  Reviewed mucous in pouch as a normal finding and taught methods for care providers to obtain a urine sample if needed in the future (never from old pouch).  Provided couple with my contact information in the event questions arise. Patient to empty pouch today independently. Enrolled patient in [place] Start Discharge program: Yes [place] nursing team will follow, and will remain available to this patient, the nursing and medical teams.  . Thanks, Maudie Flakes, MSN, RN, [place], Arther Abbott  Pager# [phone]

## 2016-07-04 NOTE — Progress Notes (Addendum)
S/W FELICIA @ [employer] [facility] # [phone]   LOVENOX  100 MG SQ BID  * NOT COVER AND NONE FORMULARY *   ENOXAPARIN 100 MG  SQ BID   COVER - YES  CO-PAY- $ 8.35  TIER - 2 DRUG  PRIOR APPROVAL - NO  PHARMACY : [facility] OR ANY RETAIL

## 2016-07-05 NOTE — Discharge Instructions (Signed)

## 2016-07-05 NOTE — Progress Notes (Signed)
Pt was Discharged home with spouse. Orders were reviewed and questions were answered. Pt was taken out via wheelchair by nurse and NT.  [place]

## 2016-07-05 NOTE — Discharge Summary (Signed)
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 Briefly, Ronald Lowery is a 75 y.o. year old patient with a history of urothelial carcinoma of the bladder.  He developed carcinoma in situ and was treated with BCG but was refractory to this treatment. After discussing options for management, he elected to proceed with radical cystectomy.   Hospital Course: He was taken to the OR on 06/28/16 and underwent a robot assisted laparoscopic radical cystectomy, PLND, and ileal conduit urinary diversion.  He was transferred to the step down ICU for close monitoring and was stable overnight.  He was transferred to a regular hospital room on POD # 1.  He demonstrated return of bowel function over the next couple of days and his diet was advanced and he was transitioned to po pain medication.  He also was able to ambulate with the help of physical therapy initially and ultimately on his own.  On POD # 4, he developed worsening nausea and fever to 103.  He was managed conservatively and his fever resolved with incentive spirometry.  His nausea also improved and his diet was again carefully advanced.  He had excellent UOP and his drain confirmed no evidence of urine leak and was removed.  He received excellent urostomy education by the [facility] nurses.  On POD # 7, he met all discharge criteria and was discharged home.  Laboratory values:  Recent Labs  07/02/16 0742 07/03/16 0403  HGB 11.8* 10.0*  HCT 34.2* 29.1*    Recent Labs  07/03/16 0403 07/04/16 0407  CREATININE 0.70 0.81    Disposition: Home  Discharge instruction: The patient was instructed to be ambulatory but told to refrain from heavy lifting, strenuous activity,  or driving.  Discharge medications:    Medication List    STOP taking these medications   acetaminophen 650 MG CR tablet Commonly known as:  TYLENOL   FISH OIL PO   HYDROcodone-acetaminophen 5-325 MG tablet Commonly known as:  NORCO/VICODIN   oxybutynin 10 MG 24 hr tablet Commonly known as:  DITROPAN-XL   phenazopyridine 100 MG tablet Commonly known as:  PYRIDIUM     TAKE these medications   ABREVA 10 % Crea Generic drug:  Docosanol Apply 1 application topically daily as needed (for fever blisters). Fever blisters   AYR SALINE NASAL GEL Swab Place 1 each into the nose as needed (nasal dryness).   enoxaparin 40 MG/0.4ML injection Commonly known as:  LOVENOX Inject 0.4 mLs (40 mg total) into the skin daily.   furosemide 40 MG tablet Commonly known as:  LASIX Take 40 mg by mouth every morning.   gabapentin 300 MG capsule Commonly known as:  NEURONTIN Take 300 mg by mouth See admin instructions. Take 300 mg by mouth with 100 mg to equal 400 mg nightly.   gabapentin 100 MG capsule Commonly known as:  NEURONTIN Take 100 mg by mouth See admin instructions. Take 100 mg by mouth with 300 mg capsule to equal 400 mg nightly.   levothyroxine 125 MCG tablet Commonly known as:  SYNTHROID, LEVOTHROID Take 125 mcg by mouth daily before breakfast.   loratadine 10 MG tablet Commonly known as:  CLARITIN Take 10 mg by mouth daily.   losartan 50 MG tablet Commonly known as:  COZAAR Take 50 mg by mouth every evening.   MUSCLE RUB 10-15 % Crea Apply 1 application topically as needed for muscle pain.   omeprazole 20 MG capsule Commonly known as:  PRILOSEC Take 20 mg by mouth 2 (two) times daily. 1 hour before breakfast.   potassium chloride SA 20 MEQ tablet Commonly known as:  K-DUR,KLOR-CON Take 20 mEq by mouth every morning.   PROBIOTIC DAILY PO Take 1 capsule by mouth every morning. [facility].   pseudoephedrine 120 MG 12 hr tablet Commonly known as:   SUDAFED Take 120 mg by mouth daily.   psyllium 0.52 g capsule Commonly known as:  REGULOID Take 0.52 g by mouth daily.   sodium chloride 0.65 % Soln nasal spray Commonly known as:  OCEAN Place 1 spray into both nostrils 2 (two) times daily as needed for congestion.   SYSTANE ULTRA 0.4-0.3 % Soln Generic drug:  Polyethyl Glycol-Propyl Glycol Apply 1 drop to eye 4 (four) times daily as needed (for dry eyes).   traMADol 50 MG tablet Commonly known as:  ULTRAM Take 1-2 tablets (50-100 mg total) by mouth every 6 (six) hours as needed for moderate pain.   zolpidem 10 MG tablet Commonly known as:  AMBIEN Take 10 mg by mouth at bedtime.       Followup:  Follow-up Information    Virginio Isidore,[name], MD Follow up on 07/12/2016.   Specialty:  Urology Why:  at 8:45 Contact information: [place] [address] [phone]

## 2016-07-05 NOTE — Progress Notes (Signed)
Patient ID: Ronald Lowery, male   DOB: [birth year], 75 y.o.   MRN: [id number]   7 Days Post-Op Subjective: Pt has done well for the last 24 hours. He did have one low grade fever yesterday (100.9 which resolved).  He has ambulated better.  Tolerating regular diet. Having bowel movements.  Objective: Vital signs in last 24 hours: Temp:  [98 F (36.7 C)-100.9 F (38.3 C)] 98.8 F (37.1 C) (09/21 0527) Pulse Rate:  [84-93] 87 (09/21 0527) Resp:  [16-18] 18 (09/21 0527) BP: (124-145)/(76-86) 140/76 (09/21 0527) SpO2:  [95 %-98 %] 98 % (09/21 0527)  Intake/Output from previous day: 09/20 0701 - 09/21 0700 In: 240 [P.O.:240] Out: 1875 [Urine:1875] Intake/Output this shift: No intake/output data recorded.  Physical Exam:  General: Alert and oriented CV: RRR Lungs: Clear Abdomen: Soft, ND, NT, Positive BS Urostomy: Pink and viable Incisions: C/D/I Ext: NT, No erythema  Lab Results:  Recent Labs  07/02/16 0742 07/03/16 0403  HGB 11.8* 10.0*  HCT 34.2* 29.1*   BMET  Recent Labs  07/03/16 0403 07/04/16 0407  NA 132* 131*  K 3.5 3.5  CL 99* 96*  CO2 25 28  GLUCOSE 109* 101*  BUN 10 7  CREATININE 0.70 0.81  CALCIUM 8.2* 8.4*     Studies/Results: Urine culture: Klebsiella  Assessment/Plan: POD #7 s/p radical cystectomy and ileal conduit - Will d/c home today - Home health arranged for urostomy teaching and Lovenox teaching (clarification - pt will need Lovenox 40 mg [place] daily (NOT 100 mg [place] bid))    LOS: 7 days   Ronald Lowery,[name] 07/05/2016, 7:14 AM

## 2016-07-05 NOTE — Progress Notes (Signed)
Physical Therapy Treatment Patient Details Name: Ronald Lowery MRN: [id number] DOB: Dec 30, 1940 Today's Date: 07/05/2016    History of Present Illness  ROBOTIC ASSISTED LAPAROSCOPIC COMPLETE CYSTECTomy ILEAL CONDUIT for baldder cancer on 06/28/16    PT Comments    Assisted with amb in hallway and practiced stairs with spouse who was a little apprehensive about getting pt into house today.  Pt did well holding to one rail and stepping up.    Follow Up Recommendations  No PT follow up     Equipment Recommendations  None recommended by PT    Recommendations for Other Services       Precautions / Restrictions Precautions Precautions: Fall Precaution Comments: urostomy Restrictions Weight Bearing Restrictions: No    Mobility  Bed Mobility               General bed mobility comments: pt OOB in recliner  Transfers Overall transfer level: Needs assistance Equipment used: Rolling walker (2 wheeled) Transfers: Sit to/from Stand Sit to Stand: Min guard         General transfer comment: close guard for safety. VCs safety  Ambulation/Gait Ambulation/Gait assistance: Min guard;Supervision Ambulation Distance (Feet): 115 Feet Assistive device: Rolling walker (2 wheeled) Gait Pattern/deviations: Step-through pattern Gait velocity: decreased   General Gait Details: cues for  posture, safe distance from RW. slow gait speed. close guard for safety   Stairs Stairs: Yes Stairs assistance: Min guard Stair Management: One rail Left;Step to pattern;Forwards Number of Stairs: 5 General stair comments: <25% VC's on safety.  Pt tolerated well. Spouse present  Wheelchair Mobility    Modified Rankin (Stroke Patients Only)       Balance                                    Cognition Arousal/Alertness: Awake/alert   Overall Cognitive Status: Within Functional Limits for tasks assessed                      Exercises      General Comments         Pertinent Vitals/Pain Pain Assessment: No/denies pain    Home Living                      Prior Function            PT Goals (current goals can now be found in the care plan section) Progress towards PT goals: Progressing toward goals    Frequency    Min 3X/week      PT Plan Current plan remains appropriate    Co-evaluation             End of Session Equipment Utilized During Treatment: Gait belt Activity Tolerance: Patient tolerated treatment well Patient left: with call bell/phone within reach;with family/visitor present;in chair;with chair alarm set     Time: [id number] PT Time Calculation (min) (ACUTE ONLY): 12 min  Charges:  $Gait Training: 8-22 mins                    G Codes:      Rica Koyanagi  PTA [facility]  Acute  Rehab Pager      [phone]

## 2016-07-06 DIAGNOSIS — M5136 Other intervertebral disc degeneration, lumbar region: Secondary | ICD-10-CM | POA: Diagnosis not present

## 2016-07-06 DIAGNOSIS — I10 Essential (primary) hypertension: Secondary | ICD-10-CM | POA: Diagnosis not present

## 2016-07-06 DIAGNOSIS — Z436 Encounter for attention to other artificial openings of urinary tract: Secondary | ICD-10-CM | POA: Diagnosis not present

## 2016-07-06 DIAGNOSIS — D090 Carcinoma in situ of bladder: Secondary | ICD-10-CM | POA: Diagnosis not present

## 2016-07-06 DIAGNOSIS — F419 Anxiety disorder, unspecified: Secondary | ICD-10-CM | POA: Diagnosis not present

## 2016-07-06 DIAGNOSIS — Z483 Aftercare following surgery for neoplasm: Secondary | ICD-10-CM | POA: Diagnosis not present

## 2016-07-07 ENCOUNTER — Encounter (HOSPITAL_COMMUNITY): Payer: Self-pay | Admitting: Emergency Medicine

## 2016-07-07 ENCOUNTER — Emergency Department (HOSPITAL_COMMUNITY): Payer: Medicare Other

## 2016-07-07 ENCOUNTER — Inpatient Hospital Stay (HOSPITAL_COMMUNITY)
Admission: EM | Admit: 2016-07-07 | Discharge: 2016-07-12 | DRG: 863 | Disposition: A | Discharge status: Home Health | Payer: Medicare Other | Attending: Urology | Admitting: Urology

## 2016-07-07 DIAGNOSIS — N12 Tubulo-interstitial nephritis, not specified as acute or chronic: Secondary | ICD-10-CM | POA: Diagnosis present

## 2016-07-07 DIAGNOSIS — F329 Major depressive disorder, single episode, unspecified: Secondary | ICD-10-CM | POA: Diagnosis present

## 2016-07-07 DIAGNOSIS — T814XXA Infection following a procedure, initial encounter: Secondary | ICD-10-CM | POA: Diagnosis present

## 2016-07-07 DIAGNOSIS — I10 Essential (primary) hypertension: Secondary | ICD-10-CM | POA: Diagnosis present

## 2016-07-07 DIAGNOSIS — Z85828 Personal history of other malignant neoplasm of skin: Secondary | ICD-10-CM | POA: Diagnosis not present

## 2016-07-07 DIAGNOSIS — Z8546 Personal history of malignant neoplasm of prostate: Secondary | ICD-10-CM

## 2016-07-07 DIAGNOSIS — M419 Scoliosis, unspecified: Secondary | ICD-10-CM | POA: Diagnosis present

## 2016-07-07 DIAGNOSIS — E039 Hypothyroidism, unspecified: Secondary | ICD-10-CM | POA: Diagnosis present

## 2016-07-07 DIAGNOSIS — Z906 Acquired absence of other parts of urinary tract: Secondary | ICD-10-CM | POA: Diagnosis not present

## 2016-07-07 DIAGNOSIS — Z96642 Presence of left artificial hip joint: Secondary | ICD-10-CM | POA: Diagnosis present

## 2016-07-07 DIAGNOSIS — Z881 Allergy status to other antibiotic agents status: Secondary | ICD-10-CM | POA: Diagnosis not present

## 2016-07-07 DIAGNOSIS — J9811 Atelectasis: Secondary | ICD-10-CM | POA: Diagnosis not present

## 2016-07-07 DIAGNOSIS — Z888 Allergy status to other drugs, medicaments and biological substances status: Secondary | ICD-10-CM

## 2016-07-07 DIAGNOSIS — Z7901 Long term (current) use of anticoagulants: Secondary | ICD-10-CM | POA: Diagnosis not present

## 2016-07-07 DIAGNOSIS — Z79899 Other long term (current) drug therapy: Secondary | ICD-10-CM

## 2016-07-07 DIAGNOSIS — C679 Malignant neoplasm of bladder, unspecified: Secondary | ICD-10-CM | POA: Diagnosis present

## 2016-07-07 DIAGNOSIS — E785 Hyperlipidemia, unspecified: Secondary | ICD-10-CM | POA: Diagnosis present

## 2016-07-07 DIAGNOSIS — K219 Gastro-esophageal reflux disease without esophagitis: Secondary | ICD-10-CM | POA: Diagnosis present

## 2016-07-07 DIAGNOSIS — R509 Fever, unspecified: Secondary | ICD-10-CM | POA: Diagnosis present

## 2016-07-07 DIAGNOSIS — R5082 Postprocedural fever: Secondary | ICD-10-CM | POA: Diagnosis not present

## 2016-07-07 DIAGNOSIS — R188 Other ascites: Secondary | ICD-10-CM | POA: Diagnosis present

## 2016-07-07 DIAGNOSIS — F419 Anxiety disorder, unspecified: Secondary | ICD-10-CM | POA: Diagnosis present

## 2016-07-07 DIAGNOSIS — Z800 Family history of malignant neoplasm of digestive organs: Secondary | ICD-10-CM

## 2016-07-07 DIAGNOSIS — Y836 Removal of other organ (partial) (total) as the cause of abnormal reaction of the patient, or of later complication, without mention of misadventure at the time of the procedure: Secondary | ICD-10-CM | POA: Diagnosis present

## 2016-07-07 DIAGNOSIS — R935 Abnormal findings on diagnostic imaging of other abdominal regions, including retroperitoneum: Secondary | ICD-10-CM | POA: Diagnosis not present

## 2016-07-07 LAB — URINALYSIS, ROUTINE W REFLEX MICROSCOPIC
Bilirubin Urine: NEGATIVE
Glucose, UA: NEGATIVE mg/dL
KETONES UR: NEGATIVE mg/dL
NITRITE: POSITIVE — AB
PH: 6.5 (ref 5.0–8.0)
PROTEIN: 100 mg/dL — AB
Specific Gravity, Urine: 1.017 (ref 1.005–1.030)

## 2016-07-07 LAB — I-STAT CG4 LACTIC ACID, ED: Lactic Acid, Venous: 0.81 mmol/L (ref 0.5–1.9)

## 2016-07-07 LAB — CBC WITH DIFFERENTIAL/PLATELET
BASOS ABS: 0 10*3/uL (ref 0.0–0.1)
BASOS PCT: 0 %
EOS ABS: 0.1 10*3/uL (ref 0.0–0.7)
Eosinophils Relative: 1 %
HCT: 30.4 % — ABNORMAL LOW (ref 39.0–52.0)
Hemoglobin: 10.4 g/dL — ABNORMAL LOW (ref 13.0–17.0)
Lymphocytes Relative: 11 %
Lymphs Abs: 1.6 10*3/uL (ref 0.7–4.0)
MCH: 31.3 pg (ref 26.0–34.0)
MCHC: 34.2 g/dL (ref 30.0–36.0)
MCV: 91.6 fL (ref 78.0–100.0)
Monocytes Absolute: 0.8 10*3/uL (ref 0.1–1.0)
Monocytes Relative: 5 %
NEUTROS PCT: 83 %
Neutro Abs: 12.6 10*3/uL — ABNORMAL HIGH (ref 1.7–7.7)
Platelets: 583 10*3/uL — ABNORMAL HIGH (ref 150–400)
RBC: 3.32 MIL/uL — AB (ref 4.22–5.81)
RDW: 12.6 % (ref 11.5–15.5)
WBC: 15.1 10*3/uL — AB (ref 4.0–10.5)

## 2016-07-07 LAB — COMPREHENSIVE METABOLIC PANEL
ALT: 25 U/L (ref 17–63)
ANION GAP: 9 (ref 5–15)
AST: 23 U/L (ref 15–41)
Albumin: 2.8 g/dL — ABNORMAL LOW (ref 3.5–5.0)
Alkaline Phosphatase: 66 U/L (ref 38–126)
BILIRUBIN TOTAL: 0.7 mg/dL (ref 0.3–1.2)
BUN: 9 mg/dL (ref 6–20)
CO2: 27 mmol/L (ref 22–32)
Calcium: 8.1 mg/dL — ABNORMAL LOW (ref 8.9–10.3)
Chloride: 88 mmol/L — ABNORMAL LOW (ref 101–111)
Creatinine, Ser: 0.9 mg/dL (ref 0.61–1.24)
Glucose, Bld: 101 mg/dL — ABNORMAL HIGH (ref 65–99)
POTASSIUM: 3.6 mmol/L (ref 3.5–5.1)
Sodium: 124 mmol/L — ABNORMAL LOW (ref 135–145)
TOTAL PROTEIN: 6.2 g/dL — AB (ref 6.5–8.1)

## 2016-07-07 LAB — CULTURE, BLOOD (ROUTINE X 2)
Culture: NO GROWTH
Culture: NO GROWTH

## 2016-07-07 LAB — URINE MICROSCOPIC-ADD ON

## 2016-07-07 MED ORDER — IOPAMIDOL (ISOVUE-300) INJECTION 61%
100.0000 mL | Freq: Once | INTRAVENOUS | Status: AC | PRN
  Administered 2016-07-07: 100 mL via INTRAVENOUS

## 2016-07-07 MED ORDER — PIPERACILLIN-TAZOBACTAM 3.375 G IVPB: 3.3750 g | Freq: Three times a day (TID) | INTRAVENOUS | Status: DC

## 2016-07-07 MED ORDER — GABAPENTIN 300 MG PO CAPS: 300.0000 mg | ORAL_CAPSULE | ORAL | Status: DC

## 2016-07-07 MED ORDER — KCL IN DEXTROSE-NACL 20-5-0.45 MEQ/L-%-% IV SOLN
INTRAVENOUS | Status: DC
  Administered 2016-07-07 – 2016-07-08 (×3): via INTRAVENOUS
  Filled 2016-07-07 (×3): qty 1000

## 2016-07-07 MED ORDER — PANTOPRAZOLE SODIUM 40 MG PO TBEC
40.0000 mg | DELAYED_RELEASE_TABLET | Freq: Every day | ORAL | Status: DC
  Administered 2016-07-08 – 2016-07-11 (×4): 40 mg via ORAL
  Filled 2016-07-07 (×5): qty 1

## 2016-07-07 MED ORDER — FUROSEMIDE 40 MG PO TABS
40.0000 mg | ORAL_TABLET | Freq: Every morning | ORAL | Status: DC
  Administered 2016-07-08 – 2016-07-12 (×5): 40 mg via ORAL
  Filled 2016-07-07 (×5): qty 1

## 2016-07-07 MED ORDER — POTASSIUM CHLORIDE CRYS ER 20 MEQ PO TBCR
20.0000 meq | EXTENDED_RELEASE_TABLET | Freq: Every morning | ORAL | Status: DC
  Administered 2016-07-08 – 2016-07-12 (×5): 20 meq via ORAL
  Filled 2016-07-07 (×5): qty 1

## 2016-07-07 MED ORDER — CLINDAMYCIN PHOSPHATE 600 MG/50ML IV SOLN
600.0000 mg | Freq: Four times a day (QID) | INTRAVENOUS | Status: DC
  Administered 2016-07-07 – 2016-07-09 (×6): 600 mg via INTRAVENOUS
  Filled 2016-07-07 (×6): qty 50

## 2016-07-07 MED ORDER — VANCOMYCIN HCL 10 G IV SOLR
1250.0000 mg | Freq: Two times a day (BID) | INTRAVENOUS | Status: DC
Start: 2016-07-08 — End: 2016-07-07
  Filled 2016-07-07: qty 1250

## 2016-07-07 MED ORDER — IOPAMIDOL (ISOVUE-300) INJECTION 61%
25.0000 mL | Freq: Once | INTRAVENOUS | Status: AC | PRN
  Administered 2016-07-07: 25 mL via ORAL

## 2016-07-07 MED ORDER — LEVOTHYROXINE SODIUM 125 MCG PO TABS
125.0000 ug | ORAL_TABLET | Freq: Every day | ORAL | Status: DC
  Administered 2016-07-08 – 2016-07-09 (×2): 125 ug via ORAL
  Filled 2016-07-07 (×3): qty 1

## 2016-07-07 MED ORDER — SENNOSIDES-DOCUSATE SODIUM 8.6-50 MG PO TABS
1.0000 | ORAL_TABLET | Freq: Two times a day (BID) | ORAL | Status: DC
  Administered 2016-07-07 – 2016-07-10 (×3): 1 via ORAL
  Filled 2016-07-07 (×8): qty 1

## 2016-07-07 MED ORDER — PIPERACILLIN-TAZOBACTAM 3.375 G IVPB 30 MIN
3.3750 g | INTRAVENOUS | Status: AC
  Administered 2016-07-07: 3.375 g via INTRAVENOUS
  Filled 2016-07-07: qty 50

## 2016-07-07 MED ORDER — OXYCODONE-ACETAMINOPHEN 5-325 MG PO TABS: 1.0000 | ORAL_TABLET | ORAL | Status: DC | PRN

## 2016-07-07 MED ORDER — GABAPENTIN 100 MG PO CAPS: 100.0000 mg | ORAL_CAPSULE | ORAL | Status: DC

## 2016-07-07 MED ORDER — VANCOMYCIN HCL IN DEXTROSE 1-5 GM/200ML-% IV SOLN
1000.0000 mg | INTRAVENOUS | Status: AC
  Administered 2016-07-07: 1000 mg via INTRAVENOUS
  Filled 2016-07-07: qty 200

## 2016-07-07 MED ORDER — SODIUM CHLORIDE 0.9 % IV BOLUS (SEPSIS)
500.0000 mL | Freq: Once | INTRAVENOUS | Status: AC
  Administered 2016-07-07: 500 mL via INTRAVENOUS

## 2016-07-07 MED ORDER — GENTAMICIN SULFATE 40 MG/ML IJ SOLN
7.0000 mg/kg | INTRAVENOUS | Status: DC
  Administered 2016-07-07 – 2016-07-11 (×4): 630 mg via INTRAVENOUS
  Filled 2016-07-07 (×4): qty 15.75

## 2016-07-07 MED ORDER — ACETAMINOPHEN 500 MG PO TABS
500.0000 mg | ORAL_TABLET | Freq: Once | ORAL | Status: AC
  Administered 2016-07-07: 500 mg via ORAL
  Filled 2016-07-07: qty 1

## 2016-07-07 MED ORDER — SODIUM CHLORIDE 0.9 % IV BOLUS (SEPSIS)
1000.0000 mL | Freq: Once | INTRAVENOUS | Status: AC
  Administered 2016-07-07: 1000 mL via INTRAVENOUS

## 2016-07-07 MED ORDER — GABAPENTIN 400 MG PO CAPS
400.0000 mg | ORAL_CAPSULE | Freq: Every day | ORAL | Status: DC
  Administered 2016-07-07 – 2016-07-11 (×5): 400 mg via ORAL
  Filled 2016-07-07 (×5): qty 1

## 2016-07-07 MED ORDER — HYDROMORPHONE HCL 1 MG/ML IJ SOLN: 0.5000 mg | INTRAMUSCULAR | Status: DC | PRN

## 2016-07-07 MED ORDER — LOSARTAN POTASSIUM 50 MG PO TABS
50.0000 mg | ORAL_TABLET | Freq: Every evening | ORAL | Status: DC
  Administered 2016-07-07: 50 mg via ORAL
  Filled 2016-07-07: qty 1

## 2016-07-07 NOTE — Progress Notes (Addendum)
Pharmacy Antibiotic Note  Ronald Lowery is a 75 y.o. male s/p radical cystectomy and ileal conduit urinary diversion 9 days ago admitted on 07/07/2016 with fever and wound infection. Pharmacy has been consulted for Vancomycin and Zosyn dosing.  Plan: Vancomycin 1g IV x 1 given in the ED. Continue with Vancomycin 1250mg  IV q12h.  Plan for Vancomycin trough level at steady state. Zosyn 3.375g IV x 1 over 30 minutes, then Zosyn 3.375g IV q8h (infuse each dose over 4 hours). Monitor renal function, cultures, clinical course.     Temp (24hrs), Avg:100.2 F (37.9 C), Min:99.2 F (37.3 C), Max:101.2 F (38.4 C)   Recent Labs Lab 07/01/16 0507 07/02/16 0742 07/03/16 0403 07/04/16 0407 07/07/16 1705 07/07/16 1720  WBC  --  9.1 7.0  --  15.1*  --   CREATININE 1.17 0.93 0.70 0.81 0.90  --   LATICACIDVEN  --   --   --   --   --  0.81    Estimated Creatinine Clearance: 87.4 mL/min (by C-G formula based on SCr of 0.9 mg/dL).    Allergies  Allergen Reactions  . Cefdinir Other (See Comments)    Muscle pain  . Myrbetriq [Mirabegron] Itching    Antimicrobials this admission: 9/23 >> Vancomycin >> 9/23 >> Zosyn >>  Dose adjustments this admission: --  Microbiology results: 9/23 BCx: sent 9/23 UCx: sent    Thank you for allowing pharmacy to be a part of this patient's care.   Lindell Spar, PharmD, BCPS Pager: [phone] 07/07/2016 5:43 PM

## 2016-07-07 NOTE — ED Notes (Signed)
Pt's wife would like to be notified when pt has arrived on unit

## 2016-07-07 NOTE — ED Notes (Signed)
Steward Drone, pt's wife can be contacted at [phone] or cell [phone]

## 2016-07-07 NOTE — H&P (Signed)
Ronald Lowery is an 75 y.o. male.    Chief Complaint: Fevers and Malaise   HPI:   1 - Fever, Malaise, Abdominal Fluid Collection & Wound Erythema - s/p cystectomy / ileal conduit 06/28/16. New fever to 102 at home x several past 24 hours and worsening weakness with malaise. Oral + IV contrast CT in ER with 8cm fluid density collection near conduit without gas / or extravasation but some mass effect on ureteral-enteric anastomeses. Exam with wound erythema and drainage likely from collection below. CXR unremarkable.  2 - Bladder Cancer - s/p cystectomy 06/2016 for refracotry [id number] disease.   Today "Ronald Lowery" is seen for admission for above. We discussed fevers by phone earlier in the day and agreed on ER eval for labs and imaging prior to likely admission.   Past Medical History:  Diagnosis Date  . Anxiety   . Arthritis   . Basal cell carcinoma 2004   x1-face  . Cataract    bilateral-may be removed 05/2015  . Complication of anesthesia    "took a while to wake up with triple hernia repair"  . DDD (degenerative disc disease), lumbar    L5-steroid injection 01/2015  . Depression   . Environmental allergies   . GERD (gastroesophageal reflux disease)   . Hematuria   . History of bladder cancer   . History of hiatal hernia   . History of prostate cancer   . History of skin cancer   . Hyperlipidemia   . Hypertension   . Hypothyroidism   . Nocturia   . Numbness    4TH / 5TH FINGER TIPS  . Prostate cancer ([place])   . Scoliosis   . Squamous carcinoma ([place]) 2008   x2- neck, face  . Urgency of urination   . Varicose veins     Past Surgical History:  Procedure Laterality Date  . APPENDECTOMY  1955  . CATARACT EXTRACTION    . CYSTOSCOPY N/A 12/19/2015   Procedure: CYSTOSCOPY;  Surgeon: Raynelle Bring, MD;  [facility];  Service: Urology;  Laterality: N/A;  . CYSTOSCOPY W/ RETROGRADES Bilateral 05/09/2015   Procedure: CYSTOSCOPY WITH LEFT URETERAL CANNULATION;  Surgeon: Raynelle Bring, MD;  [facility];  Service: Urology;  Laterality: Bilateral;  . CYSTOSCOPY W/ RETROGRADES Bilateral 05/04/2016   Procedure: CYSTOSCOPY WITH RETROGRADE PYELOGRAM;  Surgeon: Raynelle Bring, MD;  [facility];  Service: Urology;  Laterality: Bilateral;  . ESOPHAGOGASTRODUODENOSCOPY (EGD) WITH ESOPHAGEAL DILATION  2004  . EYE SURGERY     cataracts with lens implants bilateral  . FOOT SURGERY  1980's   Morton's neuroma x3  . HERNIA REPAIR     triple laproscopic  . LYMPHADENECTOMY Bilateral 06/28/2016   Procedure: PELVIC LYMPHADENECTOMY;  Surgeon: Raynelle Bring, MD;  [facility];  Service: Urology;  Laterality: Bilateral;  . MASS BIOPSY     bladder tumor biopsy  . PROSTATECTOMY  2010  . ROBOT ASSISTED LAPAROSCOPIC COMPLETE CYSTECT ILEAL CONDUIT N/A 06/28/2016   Procedure: XI ROBOTIC ASSISTED LAPAROSCOPIC COMPLETE CYSTECT ILEAL CONDUIT;  Surgeon: Raynelle Bring, MD;  [facility];  Service: Urology;  Laterality: N/A;  . SKIN CANCER DESTRUCTION  2004   basal cell carcinoma  . TONSILLECTOMY  ~1949  . TOTAL HIP ARTHROPLASTY Left 08/16/2015   Procedure: LEFT TOTAL HIP ARTHROPLASTY ANTERIOR APPROACH;  Surgeon: Paralee Cancel, MD;  [facility];  Service: Orthopedics;  Laterality: Left;  . TRANSURETHRAL RESECTION OF BLADDER TUMOR N/A 05/09/2015   Procedure: TRANSURETHRAL RESECTION OF BLADDER  TUMOR (TURBT);  Surgeon: Raynelle Bring, MD;  [facility];  Service: Urology;  Laterality: N/A;  . TRANSURETHRAL RESECTION OF BLADDER TUMOR N/A 11/14/2015   Procedure: TRANSURETHRAL RESECTION OF BLADDER TUMOR (TURBT);  Surgeon: Raynelle Bring, MD;  [facility];  Service: Urology;  Laterality: N/A;  . TRANSURETHRAL RESECTION OF BLADDER TUMOR N/A 12/19/2015   Procedure: TRANSURETHRAL RESECTION OF BLADDER TUMOR (TURBT);  Surgeon: Raynelle Bring, MD;  [facility];  Service: Urology;  Laterality: N/A;  . TRANSURETHRAL RESECTION OF BLADDER TUMOR N/A 05/04/2016   Procedure: TRANSURETHRAL  RESECTION OF BLADDER TUMOR (TURBT);  Surgeon: Raynelle Bring, MD;  [facility];  Service: Urology;  Laterality: N/A;  . VEIN SURGERY Left 2011   leg    Family History  Problem Relation Age of Onset  . Colon cancer Maternal Aunt   . Esophageal cancer Neg Hx   . Stomach cancer Neg Hx   . Rectal cancer Neg Hx    Social History:  reports that he has never smoked. He has never used smokeless tobacco. He reports that he does not drink alcohol or use drugs.  Allergies:  Allergies  Allergen Reactions  . Cefdinir Other (See Comments)    Muscle pain  . Myrbetriq [Mirabegron] Itching     (Not in a hospital admission)  Results for orders placed or performed during the hospital encounter of 07/07/16 (from the past 48 hour(s))  Comprehensive metabolic panel     Status: Abnormal   Collection Time: 07/07/16  5:05 PM  Result Value Ref Range   Sodium 124 (L) 135 - 145 mmol/L   Potassium 3.6 3.5 - 5.1 mmol/L   Chloride 88 (L) 101 - 111 mmol/L   CO2 27 22 - 32 mmol/L   Glucose, Bld 101 (H) 65 - 99 mg/dL   BUN 9 6 - 20 mg/dL   Creatinine, Ser 0.90 0.61 - 1.24 mg/dL   Calcium 8.1 (L) 8.9 - 10.3 mg/dL   Total Protein 6.2 (L) 6.5 - 8.1 g/dL   Albumin 2.8 (L) 3.5 - 5.0 g/dL   AST 23 15 - 41 U/L   ALT 25 17 - 63 U/L   Alkaline Phosphatase 66 38 - 126 U/L   Total Bilirubin 0.7 0.3 - 1.2 mg/dL   GFR calc non Af Amer >60 >60 mL/min   GFR calc Af Amer >60 >60 mL/min    Comment: (NOTE) The eGFR has been calculated using the CKD EPI equation. This calculation has not been validated in all clinical situations. eGFR's persistently <60 mL/min signify possible Chronic Kidney Disease.    Anion gap 9 5 - 15  CBC with Differential     Status: Abnormal   Collection Time: 07/07/16  5:05 PM  Result Value Ref Range   WBC 15.1 (H) 4.0 - 10.5 K/uL   RBC 3.32 (L) 4.22 - 5.81 MIL/uL   Hemoglobin 10.4 (L) 13.0 - 17.0 g/dL   HCT 30.4 (L) 39.0 - 52.0 %   MCV 91.6 78.0 - 100.0 fL   MCH 31.3 26.0 - 34.0  pg   MCHC 34.2 30.0 - 36.0 g/dL   RDW 12.6 11.5 - 15.5 %   Platelets 583 (H) 150 - 400 K/uL   Neutrophils Relative % 83 %   Neutro Abs 12.6 (H) 1.7 - 7.7 K/uL   Lymphocytes Relative 11 %   Lymphs Abs 1.6 0.7 - 4.0 K/uL   Monocytes Relative 5 %   Monocytes Absolute 0.8 0.1 - 1.0 K/uL  Eosinophils Relative 1 %   Eosinophils Absolute 0.1 0.0 - 0.7 K/uL   Basophils Relative 0 %   Basophils Absolute 0.0 0.0 - 0.1 K/uL  Urinalysis, Routine w reflex microscopic     Status: Abnormal   Collection Time: 07/07/16  5:15 PM  Result Value Ref Range   Color, Urine YELLOW YELLOW   APPearance TURBID (A) CLEAR   Specific Gravity, Urine 1.017 1.005 - 1.030   pH 6.5 5.0 - 8.0   Glucose, UA NEGATIVE NEGATIVE mg/dL   Hgb urine dipstick LARGE (A) NEGATIVE   Bilirubin Urine NEGATIVE NEGATIVE   Ketones, ur NEGATIVE NEGATIVE mg/dL   Protein, ur 100 (A) NEGATIVE mg/dL   Nitrite POSITIVE (A) NEGATIVE   Leukocytes, UA LARGE (A) NEGATIVE  Urine microscopic-add on     Status: Abnormal   Collection Time: 07/07/16  5:15 PM  Result Value Ref Range   Squamous Epithelial / LPF 0-5 (A) NONE SEEN   WBC, UA TOO NUMEROUS TO COUNT 0 - 5 WBC/hpf   RBC / HPF TOO NUMEROUS TO COUNT 0 - 5 RBC/hpf   Bacteria, UA MANY (A) NONE SEEN  I-Stat CG4 Lactic Acid, ED     Status: None   Collection Time: 07/07/16  5:20 PM  Result Value Ref Range   Lactic Acid, Venous 0.81 0.5 - 1.9 mmol/L   Dg Chest 2 View  Result Date: 07/07/2016 CLINICAL DATA:  History of bladder carcinoma.  Fever for 1 day EXAM: CHEST  2 VIEW COMPARISON:  July 01, 2016 FINDINGS: There is bibasilar atelectatic change. Lungs elsewhere clear. Heart size and pulmonary vascularity are normal. No adenopathy. No bone lesions. IMPRESSION: Bibasilar atelectasis. Lungs elsewhere clear. No adenopathy evident. Electronically Signed   By: Lowella Grip III M.D.   On: 07/07/2016 17:02   Ct Abdomen Pelvis W Contrast  Result Date: 07/07/2016 CLINICAL DATA:   History of bladder cancer with cystectomy and ileal conduit. EXAM: CT ABDOMEN AND PELVIS WITH CONTRAST TECHNIQUE: Multidetector CT imaging of the abdomen and pelvis was performed using the standard protocol following bolus administration of intravenous contrast. CONTRAST:  57m ISOVUE-300 IOPAMIDOL (ISOVUE-300) INJECTION 61%, 1056mISOVUE-300 IOPAMIDOL (ISOVUE-300) INJECTION 61% COMPARISON:  May 24, 2016 FINDINGS: Lower chest: Opacity posteriorly in the right lung base is consistent with atelectasis. Coronary artery calcifications. Lung bases otherwise normal. Hepatobiliary: Probable cyst in the hepatic dome on axial image 10. Several other small low-attenuation lesions in the liver are likely small cysts, unchanged and too small to characterize. The liver, portal vein, and gallbladder are otherwise normal. Pancreas: Unremarkable. No pancreatic ductal dilatation or surrounding inflammatory changes. Spleen: Normal in size without focal abnormality. Adrenals/Urinary Tract: The patient is status post cystectomy with ileal conduit placement. No suspicious renal masses or stones. Stents are seen in the bilateral ureters. There is a stent extending from the right renal pelvis into the ileal conduit. The left-sided stent extends from the mid left ureter into the ileal conduit but does not reach the renal pelvis. Stranding in the fat around the ileal conduit is likely postoperative in nature. There is a fluid collection adjacent to the ileal conduit as seen on axial image 62 and coronal image 72 measuring 8 by 7.9 by 8.4 cm. Stomach/Bowel: Postoperative changes associated with bowel. No acute bowel abnormalities. The appendix is not seen but there is no secondary evidence of appendicitis. Vascular/Lymphatic: Atherosclerosis with no aneurysm in the aorta. No adenopathy. Reproductive: The patient is status post prostatectomy. Other: There is a small fluid  collection at the surgical site deep within the pelvis containing 2  foci of gas on axial image 89 and coronal images 94-96 measuring 3.8 x 2.6 by 1.7 cm. There is a focus of gas inferior to the left pubic ramus on image 86 likely postoperative. There is free fluid deep in the pelvis, likely postoperative. Air and fluid in the pelvis on axial image 82 is all thought to be within bowel loops. Musculoskeletal: The patient is status post left hip replacement. No bony metastatic disease. Degenerative changes in the spine again seen. IMPRESSION: 1. The 8 x 7.9 x 8.4 cm fluid collection adjacent to the ileal conduit is likely postoperative in nature. There is no thickened wall or adjacent stranding suggesting this may represent a sterile seroma. This collection could develop into an abscess. Recommend clinical correlation and follow-up as warranted. 2. Small collection of fluid and gas at the operative site deep in the pelvis is likely postoperative. Recommend follow-up as warranted. 3. Postoperative changes. 4. No other changes. Electronically Signed   By: Dorise Bullion III M.D   On: 07/07/2016 19:56    ROS  Blood pressure 135/65, pulse 88, temperature 100.9 F (38.3 C), temperature source Oral, resp. rate 20, SpO2 96 %. Physical Exam  Constitutional: He appears well-developed.  Visibly tired and with malaise. Wife does most of talking.   HENT:  Head: Normocephalic.  Eyes: Pupils are equal, round, and reactive to light.  Neck: Normal range of motion.  Cardiovascular: Normal rate.   Respiratory: Effort normal.  GI: Soft.  Midline woung with some blanching erythema and drainage of simple fluid that is non-thick, non-foul. RLQ urostomy pink and patent with distal bander stents in situ and yellow urine in appliance.   Genitourinary: Penis normal.  Musculoskeletal: Normal range of motion.  Neurological: He is alert.  Skin: Skin is warm.  Psychiatric:  Blunted affect at present.      Assessment/Plan  1 - Fever, Malaise, Abdominal Fluid Collection & Wound Erythema -  DDX fluid collection infected urine v. Lymph v. Peritoneal fluid. This appears to be tracking through wound with some erytehma as well concerning for early cellulitis. He is non-toxic, heart rate and blood pressures acceptable.  Rec Admit, IV ABX (gent + clinda), and IR drain tomorrow of fluid collection to help determine fluid etiology / sterility / facilitate drainage and take tension off ureteral ansatamoses. Do not favor opening of wound at this point as skin changes seem to be driven by underlying fluid collection and otherwise looks very good.   2 - Bladder Cancer - s/p definitive surgery, his prognosis is excellent.   MIVF, home meds, NPO for likely IR drain tomorrow. Pt and family updated on plan.   Alexis Frock, MD 07/07/2016, 9:41 PM

## 2016-07-07 NOTE — ED Notes (Signed)
Urologist at bedisde

## 2016-07-07 NOTE — Progress Notes (Addendum)
Pharmacy Antibiotic Note  Ronald Lowery is a 75 y.o. male s/p radical cystectomy and ileal conduit urinary diversion 9 days ago admitted on 07/07/2016 with fever and wound infection. Pharmacy was originally consulted for Vancomycin and Zosyn dosing.  Patient seen by urologist who has d/c'ed Vanc/Zosyn and started Clindamycin and request pharmacy dosing of Gentamicin  Plan: D/C Vanc D/C Zosyn Clindamycin per MD Gentamicin 7mg /kg IV q24h (630mg  IV q24h) Check Gentamicin random level 10 hr after first dose Monitor renal function, cultures, clinical course.     Temp (24hrs), Avg:100.4 F (38 C), Min:99.2 F (37.3 C), Max:101.2 F (38.4 C)   Recent Labs Lab 07/01/16 0507 07/02/16 0742 07/03/16 0403 07/04/16 0407 07/07/16 1705 07/07/16 1720  WBC  --  9.1 7.0  --  15.1*  --   CREATININE 1.17 0.93 0.70 0.81 0.90  --   LATICACIDVEN  --   --   --   --   --  0.81    Estimated Creatinine Clearance: 87.4 mL/min (by C-G formula based on SCr of 0.9 mg/dL).    Allergies  Allergen Reactions  . Cefdinir Other (See Comments)    Muscle pain  . Myrbetriq [Mirabegron] Itching    Antimicrobials this admission: 9/23 >> Vancomycin >> 9/23 9/23 >> Zosyn >> 9/23 9/23 >> Clinda >> 9/24 >> [name] >>  Dose adjustments this admission: --  Microbiology results: 9/23 BCx: sent 9/23 UCx: sent   Thank you for allowing pharmacy to be a part of this patient's care.  Leone Haven, PharmD 07/07/16 @ 22:10

## 2016-07-07 NOTE — ED Provider Notes (Signed)
[facility] DEPT Provider Note   CSN: [id number] Arrival date & time: 07/07/16  1546     History   Chief Complaint Chief Complaint  Patient presents with  . Post-op Problem  . Fever    HPI KORBAN MCCAUGHAN is a 75 y.o. male.  HPI Patient had a bladder resection 9 days ago by Dr. Alinda Money. Also has ileal conduit. Had some postoperative fever on day 4. Discharge home 2 days ago. Not on antibiotics. States now started to have fevers up to 102 at home. Occasional cough. No real change in the wound. No real change in the output of the ileostomy. Per wife patient is lethargic. Patient states he just feels a little weak. States he does not feel as if he was doing that bad.   Past Medical History:  Diagnosis Date  . Anxiety   . Arthritis   . Basal cell carcinoma 2004   x1-face  . Cataract    bilateral-may be removed 05/2015  . Complication of anesthesia    "took a while to wake up with triple hernia repair"  . DDD (degenerative disc disease), lumbar    L5-steroid injection 01/2015  . Depression   . Environmental allergies   . GERD (gastroesophageal reflux disease)   . Hematuria   . History of bladder cancer   . History of hiatal hernia   . History of prostate cancer   . History of skin cancer   . Hyperlipidemia   . Hypertension   . Hypothyroidism   . Nocturia   . Numbness    4TH / 5TH FINGER TIPS  . Prostate cancer ([place])   . Scoliosis   . Squamous carcinoma ([place]) 2008   x2- neck, face  . Urgency of urination   . Varicose veins     Patient Active Problem List   Diagnosis Date Noted  . Bladder cancer ([place]) 06/28/2016  . S/P left THA, AA 08/16/2015    Past Surgical History:  Procedure Laterality Date  . APPENDECTOMY  1955  . CATARACT EXTRACTION    . CYSTOSCOPY N/A 12/19/2015   Procedure: CYSTOSCOPY;  Surgeon: Raynelle Bring, MD;  [facility];  Service: Urology;  Laterality: N/A;  . CYSTOSCOPY W/ RETROGRADES Bilateral 05/09/2015   Procedure: CYSTOSCOPY WITH  LEFT URETERAL CANNULATION;  Surgeon: Raynelle Bring, MD;  [facility];  Service: Urology;  Laterality: Bilateral;  . CYSTOSCOPY W/ RETROGRADES Bilateral 05/04/2016   Procedure: CYSTOSCOPY WITH RETROGRADE PYELOGRAM;  Surgeon: Raynelle Bring, MD;  [facility];  Service: Urology;  Laterality: Bilateral;  . ESOPHAGOGASTRODUODENOSCOPY (EGD) WITH ESOPHAGEAL DILATION  2004  . EYE SURGERY     cataracts with lens implants bilateral  . FOOT SURGERY  1980's   Morton's neuroma x3  . HERNIA REPAIR     triple laproscopic  . LYMPHADENECTOMY Bilateral 06/28/2016   Procedure: PELVIC LYMPHADENECTOMY;  Surgeon: Raynelle Bring, MD;  [facility];  Service: Urology;  Laterality: Bilateral;  . MASS BIOPSY     bladder tumor biopsy  . PROSTATECTOMY  2010  . ROBOT ASSISTED LAPAROSCOPIC COMPLETE CYSTECT ILEAL CONDUIT N/A 06/28/2016   Procedure: XI ROBOTIC ASSISTED LAPAROSCOPIC COMPLETE CYSTECT ILEAL CONDUIT;  Surgeon: Raynelle Bring, MD;  [facility];  Service: Urology;  Laterality: N/A;  . SKIN CANCER DESTRUCTION  2004   basal cell carcinoma  . TONSILLECTOMY  ~1949  . TOTAL HIP ARTHROPLASTY Left 08/16/2015   Procedure: LEFT TOTAL HIP ARTHROPLASTY ANTERIOR APPROACH;  Surgeon: Paralee Cancel, MD;  [facility];  Service: Orthopedics;  Laterality: Left;  . TRANSURETHRAL RESECTION OF BLADDER TUMOR N/A 05/09/2015   Procedure: TRANSURETHRAL RESECTION OF BLADDER TUMOR (TURBT);  Surgeon: Raynelle Bring, MD;  [facility];  Service: Urology;  Laterality: N/A;  . TRANSURETHRAL RESECTION OF BLADDER TUMOR N/A 11/14/2015   Procedure: TRANSURETHRAL RESECTION OF BLADDER TUMOR (TURBT);  Surgeon: Raynelle Bring, MD;  [facility];  Service: Urology;  Laterality: N/A;  . TRANSURETHRAL RESECTION OF BLADDER TUMOR N/A 12/19/2015   Procedure: TRANSURETHRAL RESECTION OF BLADDER TUMOR (TURBT);  Surgeon: Raynelle Bring, MD;  [facility];  Service: Urology;  Laterality: N/A;  . TRANSURETHRAL RESECTION OF BLADDER TUMOR  N/A 05/04/2016   Procedure: TRANSURETHRAL RESECTION OF BLADDER TUMOR (TURBT);  Surgeon: Raynelle Bring, MD;  [facility];  Service: Urology;  Laterality: N/A;  . VEIN SURGERY Left 2011   leg       Home Medications    Prior to Admission medications   Medication Sig Start Date End Date Taking? Authorizing Provider  Aloe-Sodium Chloride (AYR SALINE NASAL GEL) SWAB Place 1 each into the nose as needed (nasal dryness).     Historical Provider, MD  Docosanol (ABREVA) 10 % CREA Apply 1 application topically daily as needed (for fever blisters). Fever blisters    Historical Provider, MD  enoxaparin (LOVENOX) 40 MG/0.4ML injection Inject 0.4 mLs (40 mg total) into the skin daily. 07/05/16   Raynelle Bring, MD  furosemide (LASIX) 40 MG tablet Take 40 mg by mouth every morning.    Historical Provider, MD  gabapentin (NEURONTIN) 100 MG capsule Take 100 mg by mouth See admin instructions. Take 100 mg by mouth with 300 mg capsule to equal 400 mg nightly.    Historical Provider, MD  gabapentin (NEURONTIN) 300 MG capsule Take 300 mg by mouth See admin instructions. Take 300 mg by mouth with 100 mg to equal 400 mg nightly.    Historical Provider, MD  levothyroxine (SYNTHROID, LEVOTHROID) 125 MCG tablet Take 125 mcg by mouth daily before breakfast.    Historical Provider, MD  loratadine (CLARITIN) 10 MG tablet Take 10 mg by mouth daily.     Historical Provider, MD  losartan (COZAAR) 50 MG tablet Take 50 mg by mouth every evening.     Historical Provider, MD  Menthol-Methyl Salicylate (MUSCLE RUB) 10-15 % CREA Apply 1 application topically as needed for muscle pain.    Historical Provider, MD  omeprazole (PRILOSEC) 20 MG capsule Take 20 mg by mouth 2 (two) times daily. 1 hour before breakfast.    Historical Provider, MD  Polyethyl Glycol-Propyl Glycol (SYSTANE ULTRA) 0.4-0.3 % SOLN Apply 1 drop to eye 4 (four) times daily as needed (for dry eyes).     Historical Provider, MD  potassium chloride SA  (K-DUR,KLOR-CON) 20 MEQ tablet Take 20 mEq by mouth every morning.    Historical Provider, MD  Probiotic Product (PROBIOTIC DAILY PO) Take 1 capsule by mouth every morning. [facility] colon [facility].    Historical Provider, MD  pseudoephedrine (SUDAFED) 120 MG 12 hr tablet Take 120 mg by mouth daily.    Historical Provider, MD  psyllium (REGULOID) 0.52 g capsule Take 0.52 g by mouth daily.     Historical Provider, MD  sodium chloride (OCEAN) 0.65 % SOLN nasal spray Place 1 spray into both nostrils 2 (two) times daily as needed for congestion.     Historical Provider, MD  traMADol (ULTRAM) 50 MG tablet Take 1-2 tablets (50-100 mg total) by mouth every 6 (six) hours as  needed for moderate pain. 07/04/16   Raynelle Bring, MD  zolpidem (AMBIEN) 10 MG tablet Take 10 mg by mouth at bedtime.     Historical Provider, MD    Family History Family History  Problem Relation Age of Onset  . Colon cancer Maternal Aunt   . Esophageal cancer Neg Hx   . Stomach cancer Neg Hx   . Rectal cancer Neg Hx     Social History Social History  Substance Use Topics  . Smoking status: Never Smoker  . Smokeless tobacco: Never Used  . Alcohol use No     Allergies   Cefdinir and Myrbetriq [mirabegron]   Review of Systems Review of Systems  Constitutional: Positive for appetite change.  HENT: Negative for congestion.   Eyes: Negative for pain.  Respiratory: Positive for cough.   Cardiovascular: Negative for chest pain.  Gastrointestinal: Negative for abdominal pain.  Genitourinary: Negative for decreased urine volume.  Musculoskeletal: Negative for joint swelling.  Hematological: Negative for adenopathy.  Psychiatric/Behavioral: Negative for confusion.     Physical Exam Updated Vital Signs BP 121/76 (BP Location: Right Arm)   Pulse 98   Temp 99.2 F (37.3 C) (Oral)   Resp 16   SpO2 98%   Physical Exam  Constitutional: He appears well-developed.  HENT:  Head: Atraumatic.  Neck: Neck supple.    Cardiovascular: Normal rate.   Pulmonary/Chest: No respiratory distress.  Abdominal:  Right lower quadrant ileal conduit. Healing midline wound with some mild erythema and no drainage. Some expected tenderness postsurgical.  Neurological: He is alert.  Patient is awake and appropriate.  Skin: Skin is warm. Capillary refill takes less than 2 seconds.     ED Treatments / Results  Labs (all labs ordered are listed, but only abnormal results are displayed) Labs Reviewed  CULTURE, BLOOD (ROUTINE X 2)  CULTURE, BLOOD (ROUTINE X 2)  URINE CULTURE  COMPREHENSIVE METABOLIC PANEL  URINALYSIS, ROUTINE W REFLEX MICROSCOPIC (NOT AT [facility])  CBC WITH DIFFERENTIAL/PLATELET  I-STAT CG4 LACTIC ACID, ED    EKG  EKG Interpretation None       Radiology Dg Chest 2 View  Result Date: 07/07/2016 CLINICAL DATA:  History of bladder carcinoma.  Fever for 1 day EXAM: CHEST  2 VIEW COMPARISON:  July 01, 2016 FINDINGS: There is bibasilar atelectatic change. Lungs elsewhere clear. Heart size and pulmonary vascularity are normal. No adenopathy. No bone lesions. IMPRESSION: Bibasilar atelectasis. Lungs elsewhere clear. No adenopathy evident. Electronically Signed   By: Lowella Grip III M.D.   On: 07/07/2016 17:02    Procedures Procedures (including critical care time)  Medications Ordered in ED Medications - No data to display   Initial Impression / Assessment and Plan / ED Course  I have reviewed the triage vital signs and the nursing notes.  Pertinent labs & imaging results that were available during my care of the patient were reviewed by me and considered in my medical decision making (see chart for details).  Clinical Course    Patient with postoperative fever. Urinalysis shows possible infection but does have an ileal conduit. Given Zosyn and day. CT scan done and showed a fluid collection. Discussed with Dr. Tresa Moore, will admit the patient. Will monitor overnight for further  evaluation of the fluid collection. Chest x-ray did not show pneumonia. Cultures of blood and urine sent. Normal lactic acid.  Final Clinical Impressions(s) / ED Diagnoses   Final diagnoses:  None    New Prescriptions New Prescriptions   No medications  on file     Davonna Belling, MD 07/07/16 2127

## 2016-07-07 NOTE — ED Triage Notes (Signed)
Pt with hx of bladder cancer had bladder removed on 9/14. Since then pt completed antibiotics. Pt started to have a fever yesterday with increasing redness, warmth, and puss to surgical site. Pt also has a urinary catheter, when asked if it is suprapubic, pt family sts "it's a colostomy catheter."  Pt A&Ox4, appears somewhat lethargic, pt c/o extreme fatigue. Pt received tylenol about one hour ago. No fever at this time.

## 2016-07-07 NOTE — ED Notes (Signed)
Bed: WA14 Expected date:  Expected time:  Means of arrival:  Comments: Triage 2  

## 2016-07-08 ENCOUNTER — Inpatient Hospital Stay (HOSPITAL_COMMUNITY): Payer: Medicare Other

## 2016-07-08 ENCOUNTER — Encounter (HOSPITAL_COMMUNITY): Payer: Self-pay | Admitting: Radiology

## 2016-07-08 LAB — BASIC METABOLIC PANEL
Anion gap: 9 (ref 5–15)
BUN: 6 mg/dL (ref 6–20)
CALCIUM: 7.7 mg/dL — AB (ref 8.9–10.3)
CO2: 27 mmol/L (ref 22–32)
Chloride: 90 mmol/L — ABNORMAL LOW (ref 101–111)
Creatinine, Ser: 0.8 mg/dL (ref 0.61–1.24)
GFR calc Af Amer: >60 mL/min (ref >60)
GLUCOSE: 112 mg/dL — AB (ref 65–99)
Potassium: 3.4 mmol/L — ABNORMAL LOW (ref 3.5–5.1)
Sodium: 126 mmol/L — ABNORMAL LOW (ref 135–145)

## 2016-07-08 LAB — BLOOD CULTURE ID PANEL (REFLEXED)
Acinetobacter baumannii: NOT DETECTED
CANDIDA KRUSEI: NOT DETECTED
Candida albicans: NOT DETECTED
Candida glabrata: NOT DETECTED
Candida parapsilosis: NOT DETECTED
Candida tropicalis: NOT DETECTED
ENTEROCOCCUS SPECIES: NOT DETECTED
Enterobacter cloacae complex: NOT DETECTED
Enterobacteriaceae species: NOT DETECTED
Escherichia coli: NOT DETECTED
Haemophilus influenzae: NOT DETECTED
Klebsiella oxytoca: NOT DETECTED
Klebsiella pneumoniae: NOT DETECTED
LISTERIA MONOCYTOGENES: NOT DETECTED
Methicillin resistance: DETECTED — AB
NEISSERIA MENINGITIDIS: NOT DETECTED
PROTEUS SPECIES: NOT DETECTED
Pseudomonas aeruginosa: NOT DETECTED
SERRATIA MARCESCENS: NOT DETECTED
STAPHYLOCOCCUS AUREUS BCID: NOT DETECTED
STAPHYLOCOCCUS SPECIES: DETECTED — AB
STREPTOCOCCUS AGALACTIAE: NOT DETECTED
STREPTOCOCCUS SPECIES: NOT DETECTED
Streptococcus pneumoniae: NOT DETECTED
Streptococcus pyogenes: NOT DETECTED

## 2016-07-08 LAB — CBC
HCT: 31.5 % — ABNORMAL LOW (ref 39.0–52.0)
Hemoglobin: 10.6 g/dL — ABNORMAL LOW (ref 13.0–17.0)
MCH: 31.3 pg (ref 26.0–34.0)
MCHC: 33.7 g/dL (ref 30.0–36.0)
MCV: 92.9 fL (ref 78.0–100.0)
Platelets: 599 10*3/uL — ABNORMAL HIGH (ref 150–400)
RBC: 3.39 MIL/uL — ABNORMAL LOW (ref 4.22–5.81)
RDW: 12.8 % (ref 11.5–15.5)
WBC: 17.3 10*3/uL — ABNORMAL HIGH (ref 4.0–10.5)

## 2016-07-08 LAB — GENTAMICIN LEVEL, RANDOM: GENTAMICIN RM: 3.5 ug/mL

## 2016-07-08 MED ORDER — ACETAMINOPHEN 325 MG PO TABS
650.0000 mg | ORAL_TABLET | ORAL | Status: DC | PRN
  Administered 2016-07-08 (×3): 650 mg via ORAL
  Filled 2016-07-08 (×3): qty 2

## 2016-07-08 MED ORDER — SODIUM CHLORIDE 0.9 % IV BOLUS (SEPSIS)
500.0000 mL | Freq: Once | INTRAVENOUS | Status: AC
  Administered 2016-07-08: 500 mL via INTRAVENOUS

## 2016-07-08 MED ORDER — FENTANYL CITRATE (PF) 100 MCG/2ML IJ SOLN
INTRAMUSCULAR | Status: AC | PRN
  Administered 2016-07-08: 50 ug via INTRAVENOUS

## 2016-07-08 MED ORDER — FENTANYL CITRATE (PF) 100 MCG/2ML IJ SOLN
INTRAMUSCULAR | Status: AC
  Filled 2016-07-08: qty 4

## 2016-07-08 MED ORDER — MIDAZOLAM HCL 2 MG/2ML IJ SOLN
INTRAMUSCULAR | Status: AC | PRN
  Administered 2016-07-08 (×2): 1 mg via INTRAVENOUS

## 2016-07-08 MED ORDER — MIDAZOLAM HCL 2 MG/2ML IJ SOLN
INTRAMUSCULAR | Status: AC
  Filled 2016-07-08: qty 6

## 2016-07-08 NOTE — Progress Notes (Signed)
Subjective:  1 - Fever, Malaise, Abdominal Fluid Collection & Wound Erythema - s/p cystectomy / ileal conduit 06/28/16. New fever to 102 at home x several and worsening weakness with malaise. Oral + IV contrast CT in ER with 8cm fluid density collection near conduit without gas / or extravasation but some mass effect on ureteral-enteric anastomeses. Exam with wound erythema and drainage likely from collection below. CXR unremarkable.   [place] and [place] 9/23 pending. Now on empiric gent + clinda.  2 - Bladder Cancer - s/p cystectomy 06/2016 for refracotry [id number] disease.   Today "[place]" continues to show signs of systemic infection. Febrile, tachycardic. No growth from yesterday's CX's so far.   Objective: Vital signs in last 24 hours: Temp:  [99.2 F (37.3 C)-103 F (39.4 C)] 103 F (39.4 C) (09/24 [id number]) Pulse Rate:  [88-121] 121 (09/24 0633) Resp:  [16-20] 18 (09/24 [id number]) BP: (121-150)/(63-77) 150/77 (09/24 [id number]) SpO2:  [93 %-99 %] 99 % (09/24 [id number]) Weight:  [82 kg (180 lb 12.4 oz)] 82 kg (180 lb 12.4 oz) (09/23 2235)    Intake/Output from previous day: 09/23 0701 - 09/24 0700 In: 2677.4 [I.V.:911.7; IV Piggyback:1765.8] Out: 500 [Urine:500] Intake/Output this shift: No intake/output data recorded.  General appearance: alert, cooperative and some visible malaise, stable.  Eyes: negative Nose: Nares normal. Septum midline. Mucosa normal. No drainage or sinus tenderness. Throat: lips, mucosa, and tongue normal; teeth and gums normal Neck: supple, symmetrical, trachea midline Back: symmetric, no curvature. ROM normal. No CVA tenderness. Cardio: regular tachycardia GI: soft, non-tender; bowel sounds normal; no masses,  no organomegaly and stable mild wound eryteham and simple drainage. RLQ Urostomy pink / patent with bander stents and clear urien.  Male genitalia: normal Extremities: extremities normal, atraumatic, no cyanosis or edema Pulses: 2+ and symmetric Skin: Skin color,  texture, turgor normal. No rashes or lesions Lymph nodes: Cervical, supraclavicular, and axillary nodes normal. Neurologic: Grossly normal  Lab Results:   Recent Labs  07/07/16 1705  WBC 15.1*  HGB 10.4*  HCT 30.4*  PLT 583*   BMET  Recent Labs  07/07/16 1705 07/08/16 0705  NA 124* 126*  K 3.6 3.4*  CL 88* 90*  CO2 27 27  GLUCOSE 101* 112*  BUN 9 6  CREATININE 0.90 0.80  CALCIUM 8.1* 7.7*   PT/INR No results for input(s): LABPROT, INR in the last 72 hours. ABG No results for input(s): PHART, HCO3 in the last 72 hours.  Invalid input(s): PCO2, PO2  Studies/Results: Dg Chest 2 View  Result Date: 07/07/2016 CLINICAL DATA:  History of bladder carcinoma.  Fever for 1 day EXAM: CHEST  2 VIEW COMPARISON:  July 01, 2016 FINDINGS: There is bibasilar atelectatic change. Lungs elsewhere clear. Heart size and pulmonary vascularity are normal. No adenopathy. No bone lesions. IMPRESSION: Bibasilar atelectasis. Lungs elsewhere clear. No adenopathy evident. Electronically Signed   By: Lowella Grip III M.D.   On: 07/07/2016 17:02   Ct Abdomen Pelvis W Contrast  Result Date: 07/07/2016 CLINICAL DATA:  History of bladder cancer with cystectomy and ileal conduit. EXAM: CT ABDOMEN AND PELVIS WITH CONTRAST TECHNIQUE: Multidetector CT imaging of the abdomen and pelvis was performed using the standard protocol following bolus administration of intravenous contrast. CONTRAST:  40mL ISOVUE-300 IOPAMIDOL (ISOVUE-300) INJECTION 61%, 164mL ISOVUE-300 IOPAMIDOL (ISOVUE-300) INJECTION 61% COMPARISON:  May 24, 2016 FINDINGS: Lower chest: Opacity posteriorly in the right lung base is consistent with atelectasis. Coronary artery calcifications. Lung bases otherwise normal. Hepatobiliary: Probable cyst in the hepatic  dome on axial image 10. Several other small low-attenuation lesions in the liver are likely small cysts, unchanged and too small to characterize. The liver, portal vein, and  gallbladder are otherwise normal. Pancreas: Unremarkable. No pancreatic ductal dilatation or surrounding inflammatory changes. Spleen: Normal in size without focal abnormality. Adrenals/Urinary Tract: The patient is status post cystectomy with ileal conduit placement. No suspicious renal masses or stones. Stents are seen in the bilateral ureters. There is a stent extending from the right renal pelvis into the ileal conduit. The left-sided stent extends from the mid left ureter into the ileal conduit but does not reach the renal pelvis. Stranding in the fat around the ileal conduit is likely postoperative in nature. There is a fluid collection adjacent to the ileal conduit as seen on axial image 62 and coronal image 72 measuring 8 by 7.9 by 8.4 cm. Stomach/Bowel: Postoperative changes associated with bowel. No acute bowel abnormalities. The appendix is not seen but there is no secondary evidence of appendicitis. Vascular/Lymphatic: Atherosclerosis with no aneurysm in the aorta. No adenopathy. Reproductive: The patient is status post prostatectomy. Other: There is a small fluid collection at the surgical site deep within the pelvis containing 2 foci of gas on axial image 89 and coronal images 94-96 measuring 3.8 x 2.6 by 1.7 cm. There is a focus of gas inferior to the left pubic ramus on image 86 likely postoperative. There is free fluid deep in the pelvis, likely postoperative. Air and fluid in the pelvis on axial image 82 is all thought to be within bowel loops. Musculoskeletal: The patient is status post left hip replacement. No bony metastatic disease. Degenerative changes in the spine again seen. IMPRESSION: 1. The 8 x 7.9 x 8.4 cm fluid collection adjacent to the ileal conduit is likely postoperative in nature. There is no thickened wall or adjacent stranding suggesting this may represent a sterile seroma. This collection could develop into an abscess. Recommend clinical correlation and follow-up as warranted.  2. Small collection of fluid and gas at the operative site deep in the pelvis is likely postoperative. Recommend follow-up as warranted. 3. Postoperative changes. 4. No other changes. Electronically Signed   By: Dorise Bullion III M.D   On: 07/07/2016 19:56    Anti-infectives: Anti-infectives    Start     Dose/Rate Route Frequency Ordered Stop   07/08/16 0400  vancomycin (VANCOCIN) 1,250 mg in sodium chloride 0.9 % 250 mL IVPB  Status:  Discontinued     1,250 mg 166.7 mL/hr over 90 Minutes Intravenous Every 12 hours 07/07/16 1819 07/07/16 2154   07/08/16 0000  piperacillin-tazobactam (ZOSYN) IVPB 3.375 g  Status:  Discontinued     3.375 g 12.5 mL/hr over 240 Minutes Intravenous Every 8 hours 07/07/16 1812 07/07/16 2154   07/07/16 2359  gentamicin (GARAMYCIN) 630 mg in dextrose 5 % 100 mL IVPB     7 mg/kg  90.2 kg 115.8 mL/hr over 60 Minutes Intravenous Every 24 hours 07/07/16 2220     07/07/16 2200  clindamycin (CLEOCIN) IVPB 600 mg     600 mg 100 mL/hr over 30 Minutes Intravenous Every 6 hours 07/07/16 2130     07/07/16 1745  piperacillin-tazobactam (ZOSYN) IVPB 3.375 g     3.375 g 100 mL/hr over 30 Minutes Intravenous STAT 07/07/16 1738 07/07/16 1909   07/07/16 1745  vancomycin (VANCOCIN) IVPB 1000 mg/200 mL premix     1,000 mg 200 mL/hr over 60 Minutes Intravenous STAT 07/07/16 1740 07/07/16 1934  Assessment/Plan:  1 - Fever, Malaise, Abdominal Fluid Collection & Wound Erythema - continue current ABX. Discussed with IR on call and requested perc drain in to fluid collection for diagnostic and therapeutic intent. If BP becomes labile, will consider ICU transfer / PCCM input.   500cc NS bolus x1 now.     [facility], Nelline Lio 07/08/2016

## 2016-07-08 NOTE — Progress Notes (Signed)
Afternoon Check:  S: Feeling bit stronger. Keeping food down this afternoon. S/p IR perc drain earleir today.  O: NAD, improved tachycarida, BP down, but sleeping at time and not with tachycardia. SNTND, New pigtail perc drain medial to conduit with thin non-foul straw colored fluid.  Slightly improved wound erythema and resolved wound drainage SCD's in place  Drain Cr, Trig, Gram Stain, CX penidng  A/P: maintian current abx and drains. Continue IVF + PO diet.

## 2016-07-08 NOTE — Progress Notes (Addendum)
Paged by Rx at 7:15 pm regarding positive blood culture in 1/2 bottles (coag neg staph).  Second bottle pending.  Prelim urine culture showing two separate species of gram neg rods.  Drain aspirate results pending.  Was clinically improving this PM after fluid collection drained.  He had been stable on room air.  Spiked a fever this PM ~ 7pm.  Otherwise doing okay.  Currently patient receiving Clinda/Gent.  Plan - I suspect the initial results in 1/2 blood culture was a contaminant.  I will continue current Abx regimen and redraw cultures tonight.  If he continues with fevers throughout the night I will transition him to [place] in place of Clinda.   Addendum: Recheck at 2:15 A - patient is AF/VSS.  Will switch to NS because of labs.

## 2016-07-08 NOTE — Progress Notes (Signed)
Pharmacy Antibiotic Note  Ronald Lowery is a 75 y.o. male s/p radical cystectomy and ileal conduit urinary diversion 9 days ago admitted on 07/07/2016 with fever, abdominal fluid collection, wound erythema, early cellulitis. Pharmacy was originally consulted for Vancomycin and Zosyn dosing.  Patient seen by urologist who has d/c'ed Vanc/Zosyn and started Clindamycin and request pharmacy dosing of Gentamicin.  Today, 07/08/2016: - 10 hour gentamicin level obtained after first dose = 3.5 mcg/ml on 630 mg IV q24h dosing - s/p CT RLQ abscess drain catheter placement by IR  Plan: Continue Gentamicin 630mg  (~7mg /kg IV q24h) IV q24h Clinda per MD Monitor renal function, cultures, clinical course.  Height: 6' 3.5" (191.8 cm) Weight: 180 lb 12.4 oz (82 kg) IBW/kg (Calculated) : 85.65  Temp (24hrs), Avg:100.8 F (38.2 C), Min:99 F (37.2 C), Max:103 F (39.4 C)   Recent Labs Lab 07/02/16 0742 07/03/16 0403 07/04/16 0407 07/07/16 1705 07/07/16 1720 07/08/16 0705 07/08/16 0926  WBC 9.1 7.0  --  15.1*  --   --   --   CREATININE 0.93 0.70 0.81 0.90  --  0.80  --   LATICACIDVEN  --   --   --   --  0.81  --   --   GENTRANDOM  --   --   --   --   --   --  3.5    Estimated Creatinine Clearance: 95.4 mL/min (by C-G formula based on SCr of 0.8 mg/dL).    Allergies  Allergen Reactions  . Cefdinir Other (See Comments)    Muscle pain  . Myrbetriq [Mirabegron] Itching    Antimicrobials this admission: 9/23 >> Vancomycin >> 9/23 9/23 >> Zosyn >> 9/23 9/23 >> Clinda >> 9/24 >> [name] >>   Dose adjustments this admission: 9/24 10 hour gent level following 1st dose = 3.5 mcg/ml; acceptable to continue q24h dosing   Microbiology results: 9/23 BCx: IP 9/23 UCx: IP 9/24 abscess culture: sent  Thank you for allowing pharmacy to be a part of this patient's care.  Hershal Coria, PharmD, BCPS Pager: [phone] 07/08/2016 11:49 AM

## 2016-07-08 NOTE — Procedures (Signed)
CT RLQ abscess drain catheter placement Cloudy yellow fluid returned, 24ml aspirate sent for GS, C&S No complication No blood loss. See complete dictation in [facility].

## 2016-07-08 NOTE — Progress Notes (Signed)
PHARMACY - PHYSICIAN COMMUNICATION CRITICAL VALUE ALERT - BLOOD CULTURE IDENTIFICATION (BCID)  Results for orders placed or performed during the hospital encounter of 07/07/16  Blood Culture ID Panel (Reflexed) (Collected: 07/07/2016  5:10 PM)  Result Value Ref Range   Enterococcus species NOT DETECTED NOT DETECTED   Listeria monocytogenes NOT DETECTED NOT DETECTED   Staphylococcus species DETECTED (A) NOT DETECTED   Staphylococcus aureus NOT DETECTED NOT DETECTED   Methicillin resistance DETECTED (A) NOT DETECTED   Streptococcus species NOT DETECTED NOT DETECTED   Streptococcus agalactiae NOT DETECTED NOT DETECTED   Streptococcus pneumoniae NOT DETECTED NOT DETECTED   Streptococcus pyogenes NOT DETECTED NOT DETECTED   Acinetobacter baumannii NOT DETECTED NOT DETECTED   Enterobacteriaceae species NOT DETECTED NOT DETECTED   Enterobacter cloacae complex NOT DETECTED NOT DETECTED   Escherichia coli NOT DETECTED NOT DETECTED   Klebsiella oxytoca NOT DETECTED NOT DETECTED   Klebsiella pneumoniae NOT DETECTED NOT DETECTED   Proteus species NOT DETECTED NOT DETECTED   Serratia marcescens NOT DETECTED NOT DETECTED   Haemophilus influenzae NOT DETECTED NOT DETECTED   Neisseria meningitidis NOT DETECTED NOT DETECTED   Pseudomonas aeruginosa NOT DETECTED NOT DETECTED   Candida albicans NOT DETECTED NOT DETECTED   Candida glabrata NOT DETECTED NOT DETECTED   Candida krusei NOT DETECTED NOT DETECTED   Candida parapsilosis NOT DETECTED NOT DETECTED   Candida tropicalis NOT DETECTED NOT DETECTED    Name of physician (or Provider) Contacted: Dr. Louis Meckel  Changes to prescribed antibiotics: None for now. Continue current therapy with Clindamycin and Gentamicin per discussion with MD. He has ordered repeat blood cultures. ? whether Staph species in blood may be a contaminant (in 1 of 2 blood cultures). Per discussion with Magda Paganini in [facility], urine cx growing 2 gram negative rods-results to be  updated in [facility] after specimen has been processed for > 18-24 hours, so will be tomorrow AM.   --See Dr. Carlton Adam note for further plan to transition Clindamycin --> Vancomycin if continued fevers throughout the night.  Luiz Ochoa 07/08/2016  6:54 PM

## 2016-07-09 LAB — BASIC METABOLIC PANEL
Anion gap: 11 (ref 5–15)
BUN: 7 mg/dL (ref 6–20)
CALCIUM: 8.4 mg/dL — AB (ref 8.9–10.3)
CO2: 24 mmol/L (ref 22–32)
Chloride: 95 mmol/L — ABNORMAL LOW (ref 101–111)
Creatinine, Ser: 0.91 mg/dL (ref 0.61–1.24)
GFR calc Af Amer: >60 mL/min (ref >60)
GLUCOSE: 114 mg/dL — AB (ref 65–99)
Potassium: 3.6 mmol/L (ref 3.5–5.1)
Sodium: 130 mmol/L — ABNORMAL LOW (ref 135–145)

## 2016-07-09 LAB — URINE CULTURE

## 2016-07-09 LAB — CBC
HCT: 32.9 % — ABNORMAL LOW (ref 39.0–52.0)
Hemoglobin: 11.2 g/dL — ABNORMAL LOW (ref 13.0–17.0)
MCH: 31 pg (ref 26.0–34.0)
MCHC: 34 g/dL (ref 30.0–36.0)
MCV: 91.1 fL (ref 78.0–100.0)
Platelets: 642 10*3/uL — ABNORMAL HIGH (ref 150–400)
RBC: 3.61 MIL/uL — ABNORMAL LOW (ref 4.22–5.81)
RDW: 12.7 % (ref 11.5–15.5)
WBC: 17.1 10*3/uL — ABNORMAL HIGH (ref 4.0–10.5)

## 2016-07-09 LAB — GRAM STAIN

## 2016-07-09 LAB — CREATININE, FLUID (PLEURAL, PERITONEAL, JP DRAINAGE): CREAT FL: 0.9 mg/dL

## 2016-07-09 MED ORDER — LEVOTHYROXINE SODIUM 25 MCG PO TABS
125.0000 ug | ORAL_TABLET | Freq: Every day | ORAL | Status: DC
  Administered 2016-07-10 – 2016-07-12 (×3): 125 ug via ORAL
  Filled 2016-07-09 (×3): qty 1

## 2016-07-09 MED ORDER — ENOXAPARIN SODIUM 40 MG/0.4ML ~~LOC~~ SOLN
40.0000 mg | SUBCUTANEOUS | Status: DC
  Administered 2016-07-09 – 2016-07-12 (×4): 40 mg via SUBCUTANEOUS
  Filled 2016-07-09 (×4): qty 0.4

## 2016-07-09 MED ORDER — VANCOMYCIN HCL IN DEXTROSE 1-5 GM/200ML-% IV SOLN
1000.0000 mg | Freq: Two times a day (BID) | INTRAVENOUS | Status: DC
  Administered 2016-07-09 – 2016-07-11 (×5): 1000 mg via INTRAVENOUS
  Filled 2016-07-09 (×5): qty 200

## 2016-07-09 MED ORDER — SODIUM CHLORIDE 0.9 % IV SOLN
INTRAVENOUS | Status: DC
  Administered 2016-07-09 – 2016-07-11 (×5): via INTRAVENOUS

## 2016-07-09 NOTE — Progress Notes (Signed)
Pharmacy Antibiotic Note  Ronald Lowery is a 75 y.o. male s/p radical cystectomy and ileal conduit urinary diversion 9 days ago admitted on 07/07/2016 with fever, abdominal fluid collection, wound erythema, early cellulitis. Pharmacy was originally consulted for Vancomycin and Zosyn dosing.  Patient seen by urologist who has d/c'ed Vanc/Zosyn and started Clindamycin and request pharmacy dosing of Gentamicin.  Now, Pharmacy is consulted to restart Vancomycin for possible methicillin resistant staph species bacteremia.    Today, 07/09/2016: WBC remains elevated, 17.1 Tm 103.1 overnight, now 98.7 CrCl ~ 73 ml/min N  Plan: Continue Gentamicin 630mg  (~7mg /kg IV q24h) IV q24h Vancomycin 1g IV q12h. Measure Vanc trough at steady state. Follow up renal fxn, culture results, and clinical course.    Height: 6' 3.5" (191.8 cm) Weight: 180 lb 12.4 oz (82 kg) IBW/kg (Calculated) : 85.65  Temp (24hrs), Avg:99.9 F (37.7 C), Min:98.6 F (37 C), Max:103.1 F (39.5 C)   Recent Labs Lab 07/02/16 0742 07/03/16 0403 07/04/16 0407 07/07/16 1705 07/07/16 1720 07/08/16 0705 07/08/16 0926 07/09/16 0545  WBC 9.1 7.0  --  15.1*  --  17.3*  --  17.1*  CREATININE 0.93 0.70 0.81 0.90  --  0.80  --  0.91  LATICACIDVEN  --   --   --   --  0.81  --   --   --   GENTRANDOM  --   --   --   --   --   --  3.5  --     Estimated Creatinine Clearance: 83.9 mL/min (by C-G formula based on SCr of 0.91 mg/dL).    Allergies  Allergen Reactions  . Cefdinir Other (See Comments)    Muscle pain  . Myrbetriq [Mirabegron] Itching    Antimicrobials this admission: 9/23 >> Vancomycin >> 9/23 9/23 >> Zosyn >> 9/23 9/23 >> Clinda >> 9/254 9/24 >> [name] >> 9/25 >> Vancomycin >>   Dose adjustments this admission: 9/24 10 hour gent level following 1st dose = 3.5 mcg/ml; acceptable to continue q24h dosing   Microbiology results: Previous admission, 9/17 UCx: >100k Kleb pneum (Pan-sens except amp, ntf) 9/23BCx:  1/2 GPC in clusters --BCID: Staph species, methicillin resistance (likely CoNS) 9/23UCx: multiple species, suggest recollection 9/24 abscess culture: ngtd 9/24 BCx: sent  Thank you for allowing pharmacy to be a part of this patient's care.  Gretta Arab PharmD, BCPS Pager [phone] 07/09/2016 7:07 AM

## 2016-07-09 NOTE — Progress Notes (Signed)
Patient ID: Ronald Lowery, male   DOB: Feb 03, 1941, 74 y.o.   MRN: [id number]    Subjective: Pt s/p robotic cystectomy and ileal conduit on 9/14.  Discharged last week and readmitted Saturday with fever.  He was noted to have an abdominal fluid collecting and underwent percutaneous drainage yesterday.  He denies other symptoms aside from fever and chills.  No pain.  His incision was also noted to have mild serous drainage per Dr. Tresa Moore but without purulence or erythema. Started on gentamicin and clindamycin.  Objective: Vital signs in last 24 hours: Temp:  [98.2 F (36.8 C)-103.1 F (39.5 C)] 98.2 F (36.8 C) (09/25 1334) Pulse Rate:  [77-93] 89 (09/25 1334) Resp:  [18] 18 (09/25 1334) BP: (84-127)/(47-74) 119/58 (09/25 1334) SpO2:  [93 %-99 %] 99 % (09/25 1334)  Intake/Output from previous day: 09/24 0701 - 09/25 0700 In: 2721.1 [P.O.:692; I.V.:1708.3; IV Piggyback:315.8] Out: [id number] [Urine:3865; Drains:100] Intake/Output this shift: Total I/O In: 480 [P.O.:480] Out: 1100 [Urine:1000; Drains:100]  Physical Exam:  General: Alert and oriented CV: RRR Lungs: Clear Abdomen: Soft, ND, Positive BS GU: Urostomy pink and viable Incisions: Clean and intact.  There is a small area in the middle of the midline incision that has a small amount of serous drainage.  No erythema, crepitus, purulence, or induration.  Ext: NT, No erythema  Lab Results:  Recent Labs  07/07/16 1705 07/08/16 0705 07/09/16 0545  HGB 10.4* 10.6* 11.2*  HCT 30.4* 31.5* 32.9*   CBC Latest Ref Rng & Units 07/09/2016 07/08/2016 07/07/2016  WBC 4.0 - 10.5 K/uL 17.1(H) 17.3(H) 15.1(H)  Hemoglobin 13.0 - 17.0 g/dL 11.2(L) 10.6(L) 10.4(L)  Hematocrit 39.0 - 52.0 % 32.9(L) 31.5(L) 30.4(L)  Platelets 150 - 400 K/uL 642(H) 599(H) 583(H)     BMET  Recent Labs  07/08/16 0705 07/09/16 0545  NA 126* 130*  K 3.4* 3.6  CL 90* 95*  CO2 27 24  GLUCOSE 112* 114*  BUN 6 7  CREATININE 0.80 0.91  CALCIUM 7.7* 8.4*      Studies/Results: Dg Chest 2 View  Result Date: 07/07/2016 CLINICAL DATA:  History of bladder carcinoma.  Fever for 1 day EXAM: CHEST  2 VIEW COMPARISON:  July 01, 2016 FINDINGS: There is bibasilar atelectatic change. Lungs elsewhere clear. Heart size and pulmonary vascularity are normal. No adenopathy. No bone lesions. IMPRESSION: Bibasilar atelectasis. Lungs elsewhere clear. No adenopathy evident. Electronically Signed   By: Lowella Grip III M.D.   On: 07/07/2016 17:02   Ct Abdomen Pelvis W Contrast  Result Date: 07/07/2016 CLINICAL DATA:  History of bladder cancer with cystectomy and ileal conduit. EXAM: CT ABDOMEN AND PELVIS WITH CONTRAST TECHNIQUE: Multidetector CT imaging of the abdomen and pelvis was performed using the standard protocol following bolus administration of intravenous contrast. CONTRAST:  32mL ISOVUE-300 IOPAMIDOL (ISOVUE-300) INJECTION 61%, 176mL ISOVUE-300 IOPAMIDOL (ISOVUE-300) INJECTION 61% COMPARISON:  May 24, 2016 FINDINGS: Lower chest: Opacity posteriorly in the right lung base is consistent with atelectasis. Coronary artery calcifications. Lung bases otherwise normal. Hepatobiliary: Probable cyst in the hepatic dome on axial image 10. Several other small low-attenuation lesions in the liver are likely small cysts, unchanged and too small to characterize. The liver, portal vein, and gallbladder are otherwise normal. Pancreas: Unremarkable. No pancreatic ductal dilatation or surrounding inflammatory changes. Spleen: Normal in size without focal abnormality. Adrenals/Urinary Tract: The patient is status post cystectomy with ileal conduit placement. No suspicious renal masses or stones. Stents are seen in the bilateral ureters. There is  a stent extending from the right renal pelvis into the ileal conduit. The left-sided stent extends from the mid left ureter into the ileal conduit but does not reach the renal pelvis. Stranding in the fat around the ileal conduit  is likely postoperative in nature. There is a fluid collection adjacent to the ileal conduit as seen on axial image 62 and coronal image 72 measuring 8 by 7.9 by 8.4 cm. Stomach/Bowel: Postoperative changes associated with bowel. No acute bowel abnormalities. The appendix is not seen but there is no secondary evidence of appendicitis. Vascular/Lymphatic: Atherosclerosis with no aneurysm in the aorta. No adenopathy. Reproductive: The patient is status post prostatectomy. Other: There is a small fluid collection at the surgical site deep within the pelvis containing 2 foci of gas on axial image 89 and coronal images 94-96 measuring 3.8 x 2.6 by 1.7 cm. There is a focus of gas inferior to the left pubic ramus on image 86 likely postoperative. There is free fluid deep in the pelvis, likely postoperative. Air and fluid in the pelvis on axial image 82 is all thought to be within bowel loops. Musculoskeletal: The patient is status post left hip replacement. No bony metastatic disease. Degenerative changes in the spine again seen. IMPRESSION: 1. The 8 x 7.9 x 8.4 cm fluid collection adjacent to the ileal conduit is likely postoperative in nature. There is no thickened wall or adjacent stranding suggesting this may represent a sterile seroma. This collection could develop into an abscess. Recommend clinical correlation and follow-up as warranted. 2. Small collection of fluid and gas at the operative site deep in the pelvis is likely postoperative. Recommend follow-up as warranted. 3. Postoperative changes. 4. No other changes. Electronically Signed   By: Dorise Bullion III M.D   On: 07/07/2016 19:56   Ct Image Guided Drainage Percut Cath  Peritoneal Retroperit  Result Date: 07/08/2016 CLINICAL DATA:  Fever, Malaise, Abdominal Fluid Collection & Wound Erythema - s/p cystectomy / ileal conduit 06/28/16. New fever to 102 at home x several and worsening weakness with malaise. Oral + IV contrast CT in ER with 8cm fluid  density collection near conduit without gas / or extravasation but some mass effect on ureteral-enteric anastomeses. Exam with wound erythema and drainage likely from collection below. EXAM: CT GUIDED DRAINAGE OF RIGHT LOWER QUADRANT PERITONEAL ABSCESS ANESTHESIA/SEDATION: Intravenous Fentanyl and Versed were administered as conscious sedation during continuous monitoring of the patient's level of consciousness and physiological / cardiorespiratory status by the radiology RN, with a total moderate sedation time of 43 minutes. PROCEDURE: The procedure, risks, benefits, and alternatives were explained to the patient. Questions regarding the procedure were encouraged and answered. The patient understands and consents to the procedure. Select axial scans through the abdomen were obtained. The right lower quadrant collection was localized and an appropriate skin entry site was determined and marked. The operative field was prepped with chlorhexidinein a sterile fashion, and a sterile drape was applied covering the operative field. A sterile gown and sterile gloves were used for the procedure. Local anesthesia was provided with 1% Lidocaine. Under intermittent CT guidance, a 19 gauge percutaneous entry needle was advanced into the collection. Thin yellow fluid returned spontaneously through the needle hub. An Amplatz wire advanced easily, position within the collection confirmed on CT. Tract dilated to facilitate placement of a 12 French pigtail drain catheter, formed within the dependent aspect of the collection. Position confirmed on CT. Catheter secured externally with 0 Prolene suture and StatLock and placed to  gravity drain bag. 20 mL of the slightly cloudy yellow aspirate were sent for Gram stain, culture and sensitivity. The patient tolerated the procedure well. COMPLICATIONS: None immediate FINDINGS: Unilocular right lower quadrant collection adjacent to the surgical anastomosis was again localized. Twelve French  pigtail drain catheter placed under CT guidance as above. Sample of the aspirate sent for Gram stain and culture. IMPRESSION: 1. Technically successful right lower quadrant peritoneal abscess drain catheter placement under CT guidance. Electronically Signed   By: Lucrezia Europe M.D.   On: 07/08/2016 12:11   Blood culture (one with prelim GPC in clusters suggest MRSA), Other blood cultures negative and pending.  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  Switched clindamycin to vancomycin due to questionable MRSA in blood culture (although may be contaminant - await other cultures).  Drain labs pending to check for urinoma, lymphatic leak.  Continue to monitor incision.  Do not see an indication to open incision at this time. 2) GI: Patient has been having bowel movements.  Continue regular diet. 3) Post op care: Lovenox, SCD, ambulate 4) Wound care: Urostomy care and continued teaching.  Will re-consult [facility] nurse to continue this process since he has been readmitted.   LOS: 2 days   Narely Nobles,[name] 07/09/2016, 1:51 PM

## 2016-07-10 LAB — CULTURE, BLOOD (ROUTINE X 2)

## 2016-07-10 LAB — CBC
HCT: 28.4 % — ABNORMAL LOW (ref 39.0–52.0)
Hemoglobin: 10.1 g/dL — ABNORMAL LOW (ref 13.0–17.0)
MCH: 31.5 pg (ref 26.0–34.0)
MCHC: 35.6 g/dL (ref 30.0–36.0)
MCV: 88.5 fL (ref 78.0–100.0)
PLATELETS: 623 10*3/uL — AB (ref 150–400)
RBC: 3.21 MIL/uL — ABNORMAL LOW (ref 4.22–5.81)
RDW: 12.4 % (ref 11.5–15.5)
WBC: 9.5 10*3/uL (ref 4.0–10.5)

## 2016-07-10 LAB — TRIGLYCERIDES, BODY FLUIDS: TRIGLYCERIDES FL: 18 mg/dL

## 2016-07-10 LAB — BASIC METABOLIC PANEL
ANION GAP: 9 (ref 5–15)
BUN: 7 mg/dL (ref 6–20)
CO2: 28 mmol/L (ref 22–32)
Calcium: 8.2 mg/dL — ABNORMAL LOW (ref 8.9–10.3)
Chloride: 95 mmol/L — ABNORMAL LOW (ref 101–111)
Creatinine, Ser: 0.79 mg/dL (ref 0.61–1.24)
GFR calc non Af Amer: >60 mL/min (ref >60)
GLUCOSE: 93 mg/dL (ref 65–99)
Potassium: 3.8 mmol/L (ref 3.5–5.1)
SODIUM: 132 mmol/L — AB (ref 135–145)

## 2016-07-10 NOTE — Progress Notes (Signed)
Patient ID: Ronald Lowery, male   DOB: [birth year], 75 y.o.   MRN: [id number]    Subjective: Pt feeling much improved over last 24 hours.  No nausea.  Having bowel movements and tolerating regular diet.  No fever over last 24 hours.  Remains on vancomycin and gentamicin.  I opened a small 1.5 cm area at the mid section of his midline wound last night with serous drainage and no underlying pus or clear evidence of infection.  This is packed with wet to dry dressing.  Objective: Vital signs in last 24 hours: Temp:  [98.2 F (36.8 C)-98.8 F (37.1 C)] 98.3 F (36.8 C) (09/26 0453) Pulse Rate:  [72-89] 72 (09/26 0453) Resp:  [16-18] 18 (09/26 0453) BP: (108-128)/(52-63) 128/63 (09/26 0453) SpO2:  [95 %-99 %] 95 % (09/26 0453)  Intake/Output from previous day: 09/25 0701 - 09/26 0700 In: 3417.4 [P.O.:480; I.V.:2411.7; IV Piggyback:515.8] Out: 4425 [Urine:4200; Drains:225] Intake/Output this shift: No intake/output data recorded.  Physical Exam:  General: Alert and oriented CV: RRR Lungs: Clear Abdomen: Soft, ND, Positive BS, NT GU: Urostomy pink and viable, stents in place Incisions: Clean and dry.  Small open area of midline wound with excellent granulation tissue. No erythema of wound or other clear evidence of infection. Ext: NT, No erythema  Lab Results:  Recent Labs  07/08/16 0705 07/09/16 0545 07/10/16 0638  HGB 10.6* 11.2* 10.1*  HCT 31.5* 32.9* 28.4*   CBC Latest Ref Rng & Units 07/10/2016 07/09/2016 07/08/2016  WBC 4.0 - 10.5 K/uL 9.5 17.1(H) 17.3(H)  Hemoglobin 13.0 - 17.0 g/dL 10.1(L) 11.2(L) 10.6(L)  Hematocrit 39.0 - 52.0 % 28.4(L) 32.9(L) 31.5(L)  Platelets 150 - 400 K/uL 623(H) 642(H) 599(H)   Drain Cr 0.9  BMET  Recent Labs  07/09/16 0545 07/10/16 0638  NA 130* 132*  K 3.6 3.8  CL 95* 95*  CO2 24 28  GLUCOSE 114* 93  BUN 7 7  CREATININE 0.91 0.79  CALCIUM 8.4* 8.2*     Studies/Results:  Assessment/Plan: POD # 12 s/p RAL radical cystectomy  and ileal conduit readmitted with fever  1) ID: Pt much improved clinically.  WBC now normal.  Await culture results to be final.  Still no clear source for infection.  One blood culture with GPC although ? Contamination as other are negative currently.  Will empirically treat this source and urine culture from prior admission if no other clear source identified.  Continue vancomycin and gentamicin for next 24 hours until results from cultures.  Drained fluid collection is not a urinoma.  Still awaiting cultures.  Will consider drain removal prior to discharge if no clear evidence of infection. Wound did not appear infected.  Wet to dry dressings to assist in granulating in.  2) Post-op care: Increase ambulation today to 4-6 times.  Continue regular diet.  DVT prophylaxis with Lovenox, SCDs.  Urostomy care and continued teaching for patient and family.  3) Disposition: Awaiting final cultures and continuing IV antibiotics as above.  Anticipate discharge in next 24-48 hours.  Will need to resume home health nursing care at that time.   LOS: 3 days   [name],[name] 07/10/2016, 7:58 AM

## 2016-07-10 NOTE — Consult Note (Signed)
[place] [facility] Nurse ostomy consult note: Patient las t seen by this writer 6 days ago for discharge visit. Stoma type/location: RLQ urostomy (ileal conduit) Stomal assessment/size: 1 and 3/8 inches round with two stents intact Peristomal assessment: not seen today.  New ostomy pouch was just applied yesterday Treatment options for stomal/peristomal skin: Skin barrier ring Output Clear yellow urine Ostomy pouching: 1pc.convex urostomy pouching system with skin barrier ring.  Drainage tube adapter used to attach to bedside urinary drainage bag at night and while in bed. Education provided: Patient reports that things went well at home until he had his fever.  Disconnecting at night.  Bedside RN encouraged to have patient be independent with urostomy this admission so that he does not regress if his physical condition allows.  He is to disconnect and connect pouch from bedside drainage bag as he would at home while here. HHRN should be resumed upon discharge. Two pouches, two skin barrier rings and 1 drain tube adapter is provided to the bedside. Enrolled patient in [place] program: Yes [place] nursing team will not follow, but will remain available to this patient, the nursing and medical teams.  Please re-consult if needed. Thanks, Maudie Flakes, MSN, RN, [place], Arther Abbott  Pager# [phone]

## 2016-07-11 LAB — BASIC METABOLIC PANEL
ANION GAP: 9 (ref 5–15)
BUN: 8 mg/dL (ref 6–20)
CO2: 26 mmol/L (ref 22–32)
Calcium: 8 mg/dL — ABNORMAL LOW (ref 8.9–10.3)
Chloride: 97 mmol/L — ABNORMAL LOW (ref 101–111)
Creatinine, Ser: 0.79 mg/dL (ref 0.61–1.24)
GLUCOSE: 86 mg/dL (ref 65–99)
POTASSIUM: 3.6 mmol/L (ref 3.5–5.1)
SODIUM: 132 mmol/L — AB (ref 135–145)

## 2016-07-11 LAB — CBC
HCT: 29.3 % — ABNORMAL LOW (ref 39.0–52.0)
Hemoglobin: 10 g/dL — ABNORMAL LOW (ref 13.0–17.0)
MCH: 31.1 pg (ref 26.0–34.0)
MCHC: 34.1 g/dL (ref 30.0–36.0)
MCV: 91 fL (ref 78.0–100.0)
PLATELETS: 692 10*3/uL — AB (ref 150–400)
RBC: 3.22 MIL/uL — AB (ref 4.22–5.81)
RDW: 12.7 % (ref 11.5–15.5)
WBC: 7.6 10*3/uL (ref 4.0–10.5)

## 2016-07-11 MED ORDER — SULFAMETHOXAZOLE-TRIMETHOPRIM 800-160 MG PO TABS: 1.0000 | ORAL_TABLET | Freq: Two times a day (BID) | ORAL | 0 refills | Status: DC

## 2016-07-11 MED ORDER — SULFAMETHOXAZOLE-TRIMETHOPRIM 800-160 MG PO TABS
1.0000 | ORAL_TABLET | Freq: Two times a day (BID) | ORAL | Status: DC
  Administered 2016-07-11 – 2016-07-12 (×2): 1 via ORAL
  Filled 2016-07-11 (×2): qty 1

## 2016-07-11 NOTE — Progress Notes (Signed)
Patient ID: Ronald Lowery, male   DOB: 19-Dec-1940, 75 y.o.   MRN: [id number]    Subjective: Pt without complaints over last 24 hours.  Continues to feel well.  Ambulating well.  Tolerating diet.  No pain symptoms.  Objective: Vital signs in last 24 hours: Temp:  [98.2 F (36.8 C)-98.3 F (36.8 C)] 98.2 F (36.8 C) (09/27 0522) Pulse Rate:  [69-78] 72 (09/27 0522) Resp:  [16-18] 16 (09/27 0522) BP: (120-129)/(68-77) 127/77 (09/27 0522) SpO2:  [95 %-99 %] 95 % (09/27 0522)  Intake/Output from previous day: 09/26 0701 - 09/27 0700 In: 3508.3 [P.O.:600; I.V.:2403.3; IV Piggyback:500] Out: [id number] [Urine:5150; Drains:175] Intake/Output this shift: Total I/O In: 240 [P.O.:240] Out: 600 [Urine:600]  Physical Exam:  General: Alert and oriented CV: RRR Lungs: Clear Abdomen: Soft, ND, NT Incisions: Small opened area of incision granulating nicely without evidence of infection.  Likely he had a small seroma. Ext: NT, No erythema  Lab Results:  Recent Labs  07/09/16 0545 07/10/16 0638 07/11/16 0517  HGB 11.2* 10.1* 10.0*  HCT 32.9* 28.4* 29.3*   BMET  Recent Labs  07/10/16 0638 07/11/16 0517  NA 132* 132*  K 3.8 3.6  CL 95* 97*  CO2 28 26  GLUCOSE 93 86  BUN 7 8  CREATININE 0.79 0.79  CALCIUM 8.2* 8.0*     Studies/Results: No results found.  Assessment/Plan: POD # 13 s/p RAL radical cystectomy and ileal conduit readmitted with fever.  1) ID: Still awaiting culture results.  Continued on vancomycin and gentamicin with normalization of WBC.  No clear source for infection (one blood culture positive but question if contaminant in absence of other blood cultures growing anything).  Most likely source is pyelonephritis.  Will re-evaluate cultures this afternoon and will then consider removal of abdominal drain and ureteral stents with empiric therapy of urine culture from last hospitalization.  2) Post-op care: Continue wet to dry dressing to incision.  DVT  prophylaxis.  Will need to restart home health with plans for d/c later today or tomorrow.  Will need instruction on wet to dry dressing changes.   LOS: 4 days   Ronald Lowery,[name] 07/11/2016, 11:07 AM

## 2016-07-11 NOTE — Progress Notes (Signed)
Patient ID: Ronald Lowery, male   DOB: November 24, 1940, 75 y.o.   MRN: [id number]   Pt continues to do well subjectively.  No fever today.  Cultures still without a convincing clear source for recent fever and presumed infection with elevated WBC.   Most likely source is pyelonephritis considering ileal conduit diversion.  Abdominal drain without evidence of urinoma, lymphatic leak, or bacterial growth - removed this afternoon.  I also removed ureteral stents.   Will transition patient off IV antibiotics to po antibiotic therapy with Bactrim based on urine culture from prior hospitalization.    Check WBC in am and if still normal and no fever, will d/c on oral antibiotic regimen.

## 2016-07-11 NOTE — Care Management Important Message (Signed)
Important Message  Patient Details  Name: SUMANTH WOODRING MRN: [id number] Date of Birth: 09/16/1941   Medicare Important Message Given:  Yes    Camillo Flaming 07/11/2016, 10:42 AMImportant Message  Patient Details  Name: NOHL TRZCINSKI MRN: [id number] Date of Birth: 18-May-1941   Medicare Important Message Given:  Yes    Camillo Flaming 07/11/2016, 10:42 AM

## 2016-07-12 LAB — CBC
HEMATOCRIT: 30.7 % — AB (ref 39.0–52.0)
HEMOGLOBIN: 10.7 g/dL — AB (ref 13.0–17.0)
MCH: 30.7 pg (ref 26.0–34.0)
MCHC: 34.9 g/dL (ref 30.0–36.0)
MCV: 88.2 fL (ref 78.0–100.0)
Platelets: 763 10*3/uL — ABNORMAL HIGH (ref 150–400)
RBC: 3.48 MIL/uL — AB (ref 4.22–5.81)
RDW: 12.5 % (ref 11.5–15.5)
WBC: 9.4 10*3/uL (ref 4.0–10.5)

## 2016-07-12 NOTE — Consult Note (Signed)
   [facility] CM Inpatient Consult   07/12/2016  Ronald Lowery [birth year] [id number]    [name] screened for [place] Management program services. Spoke with inpatient RNCM prior to bedside visit. Went to bedside and spoke with [name] to discuss and offer [place] Management program. [name] states "he will have [facility] at home and that may be all we need at this time". Provided [facility] Care Management brochure, contact information, and 24-hr nurse line number to contact if needed. Appreciative of visit. Made inpatient RNCM aware that the [facility] pleasantly declined [place] Management program at this time.    Marthenia Rolling, MSN-Ed, RN,BSN [facility] Liaison [phone]

## 2016-07-12 NOTE — Discharge Summary (Signed)
Date of admission: 07/07/2016  Date of discharge: 07/12/2016  Admission diagnosis: Fever, BCG refractory CIS of the bladder  Discharge diagnosis: Fever likely secondary to pyelonephritis, BCG refractory CIS of the bladder  Secondary diagnoses: Hypertension  History and Physical: For full details, please see admission history and physical. Briefly, Ronald GODETTE is a 75 y.o. year old patient with BCG refractory CIS of the bladder.  He is s/p a RAL radical cystectomy and ileal conduit urinary diversion on 06/28/16.  He was discharge home last week and presented on Saturday with a fever up to 103 F.    Hospital Course: He was admitted by Dr. Tresa Moore on 07/07/16 and began broad spectrum IV antibiotics with clindamycin and gentamicin. CT imaging in the ED demonstrated a fluid collection near the ileal conduit and he underwent percutaneous drainage of this fluid collection on 07/08/16.  The fluid did not appear purulent and did not grow any bacteria.  There also was no evidence to suggest this was a urinoma or lymphocele based on laboratory studies on the fluid from the drain.  Although one blood culture suggested coagulase negative MRSA, all other blood cultures demonstrated no growth and it was felt that this very likely was a skin contaminant. Regardless, his clindamycin was switched to vancomycin as a precaution.  He also was noted to have some serous drainage from the mid portion of his wound.  This was eventually opened and appeared to be a small seroma without evidence of infection. This was managed with wet to dry dressings.  His WBC did normalize on antibiotic therapy.  Ultimately, his ureteral stents and percutaneous drain were removed.  He remained afebrile for 72 hours while awaiting culture results.  Ultimately, it was felt that the most likely cause of his fever was pyelonephritis considering his recent ileal conduit urinary diversion and he was treated with Bactrim based on his prior urine culture  results from his previous hospitalization.  He was felt stable for discharge home on 07/12/16.  Laboratory values:  Recent Labs  07/10/16 0638 07/11/16 0517 07/12/16 0507  HGB 10.1* 10.0* 10.7*  HCT 28.4* 29.3* 30.7*   CBC Latest Ref Rng & Units 07/12/2016 07/11/2016 07/10/2016  WBC 4.0 - 10.5 K/uL 9.4 7.6 9.5  Hemoglobin 13.0 - 17.0 g/dL 10.7(L) 10.0(L) 10.1(L)  Hematocrit 39.0 - 52.0 % 30.7(L) 29.3(L) 28.4(L)  Platelets 150 - 400 K/uL 763(H) 692(H) 623(H)       Recent Labs  07/10/16 0638 07/11/16 0517  CREATININE 0.79 0.79    Disposition: Home  Discharge instruction: The patient was instructed to be ambulatory but told to refrain from heavy lifting, strenuous activity, or driving. He will resume home health nursing care for ostomy care, Lovenox administration (up to 30 days post-op), and wound care.  Discharge medications:    Medication List    TAKE these medications   ABREVA 10 % Crea Generic drug:  Docosanol Apply 1 application topically daily as needed (for fever blisters). Fever blisters   acetaminophen 500 MG tablet Commonly known as:  TYLENOL Take 1,000 mg by mouth every 6 (six) hours as needed for fever.   AYR SALINE NASAL GEL Swab Place 1 each into the nose as needed (nasal dryness).   enoxaparin 40 MG/0.4ML injection Commonly known as:  LOVENOX Inject 0.4 mLs (40 mg total) into the skin daily.   furosemide 40 MG tablet Commonly known as:  LASIX Take 40 mg by mouth every morning.   gabapentin 300 MG capsule Commonly known  as:  NEURONTIN Take 300 mg by mouth See admin instructions. Take 300 mg by mouth with 100 mg to equal 400 mg nightly.   gabapentin 100 MG capsule Commonly known as:  NEURONTIN Take 100 mg by mouth See admin instructions. Take 100 mg by mouth with 300 mg capsule to equal 400 mg nightly.   levothyroxine 125 MCG tablet Commonly known as:  SYNTHROID, LEVOTHROID Take 125 mcg by mouth daily before breakfast.   loratadine 10 MG  tablet Commonly known as:  CLARITIN Take 10 mg by mouth daily as needed for allergies.   losartan 50 MG tablet Commonly known as:  COZAAR Take 50 mg by mouth every evening.   omeprazole 20 MG capsule Commonly known as:  PRILOSEC Take 20 mg by mouth 2 (two) times daily. 1 hour before breakfast.   potassium chloride SA 20 MEQ tablet Commonly known as:  K-DUR,KLOR-CON Take 20 mEq by mouth every morning.   PROBIOTIC DAILY PO Take 1 capsule by mouth every morning. [facility].   pseudoephedrine 120 MG 12 hr tablet Commonly known as:  SUDAFED Take 120 mg by mouth daily as needed for congestion.   psyllium 0.52 g capsule Commonly known as:  REGULOID Take 0.52 g by mouth daily.   sodium chloride 0.65 % Soln nasal spray Commonly known as:  OCEAN Place 1 spray into both nostrils 2 (two) times daily as needed for congestion.   sulfamethoxazole-trimethoprim 800-160 MG tablet Commonly known as:  BACTRIM DS,SEPTRA DS Take 1 tablet by mouth 2 (two) times daily.   SYSTANE ULTRA 0.4-0.3 % Soln Generic drug:  Polyethyl Glycol-Propyl Glycol Apply 1 drop to eye 4 (four) times daily as needed (for dry eyes).   traMADol 50 MG tablet Commonly known as:  ULTRAM Take 1-2 tablets (50-100 mg total) by mouth every 6 (six) hours as needed for moderate pain.   zolpidem 10 MG tablet Commonly known as:  AMBIEN Take 10 mg by mouth at bedtime.       Followup: Will arrange for 10-14 days.

## 2016-07-13 LAB — CULTURE, BLOOD (ROUTINE X 2): Culture: NO GROWTH

## 2016-07-13 LAB — AEROBIC/ANAEROBIC CULTURE (SURGICAL/DEEP WOUND)
CULTURE: NO GROWTH
SPECIAL REQUESTS: NORMAL

## 2016-07-13 LAB — AEROBIC/ANAEROBIC CULTURE W GRAM STAIN (SURGICAL/DEEP WOUND)

## 2016-07-14 DIAGNOSIS — Z436 Encounter for attention to other artificial openings of urinary tract: Secondary | ICD-10-CM | POA: Diagnosis not present

## 2016-07-14 DIAGNOSIS — Z483 Aftercare following surgery for neoplasm: Secondary | ICD-10-CM | POA: Diagnosis not present

## 2016-07-14 DIAGNOSIS — D090 Carcinoma in situ of bladder: Secondary | ICD-10-CM | POA: Diagnosis not present

## 2016-07-14 DIAGNOSIS — I10 Essential (primary) hypertension: Secondary | ICD-10-CM | POA: Diagnosis not present

## 2016-07-14 DIAGNOSIS — F419 Anxiety disorder, unspecified: Secondary | ICD-10-CM | POA: Diagnosis not present

## 2016-07-14 DIAGNOSIS — M5136 Other intervertebral disc degeneration, lumbar region: Secondary | ICD-10-CM | POA: Diagnosis not present

## 2016-07-14 LAB — CULTURE, BODY FLUID W GRAM STAIN -BOTTLE: Culture: NO GROWTH

## 2016-07-14 LAB — CULTURE, BLOOD (ROUTINE X 2)
Culture: NO GROWTH
Culture: NO GROWTH

## 2016-07-14 LAB — CULTURE, BODY FLUID-BOTTLE

## 2016-07-18 DIAGNOSIS — Z436 Encounter for attention to other artificial openings of urinary tract: Secondary | ICD-10-CM | POA: Diagnosis not present

## 2016-07-18 DIAGNOSIS — M5136 Other intervertebral disc degeneration, lumbar region: Secondary | ICD-10-CM | POA: Diagnosis not present

## 2016-07-18 DIAGNOSIS — Z483 Aftercare following surgery for neoplasm: Secondary | ICD-10-CM | POA: Diagnosis not present

## 2016-07-18 DIAGNOSIS — F419 Anxiety disorder, unspecified: Secondary | ICD-10-CM | POA: Diagnosis not present

## 2016-07-18 DIAGNOSIS — I10 Essential (primary) hypertension: Secondary | ICD-10-CM | POA: Diagnosis not present

## 2016-07-18 DIAGNOSIS — D090 Carcinoma in situ of bladder: Secondary | ICD-10-CM | POA: Diagnosis not present

## 2016-07-19 DIAGNOSIS — I10 Essential (primary) hypertension: Secondary | ICD-10-CM | POA: Diagnosis not present

## 2016-07-19 DIAGNOSIS — M5136 Other intervertebral disc degeneration, lumbar region: Secondary | ICD-10-CM | POA: Diagnosis not present

## 2016-07-19 DIAGNOSIS — Z483 Aftercare following surgery for neoplasm: Secondary | ICD-10-CM | POA: Diagnosis not present

## 2016-07-19 DIAGNOSIS — Z436 Encounter for attention to other artificial openings of urinary tract: Secondary | ICD-10-CM | POA: Diagnosis not present

## 2016-07-19 DIAGNOSIS — F419 Anxiety disorder, unspecified: Secondary | ICD-10-CM | POA: Diagnosis not present

## 2016-07-19 DIAGNOSIS — D090 Carcinoma in situ of bladder: Secondary | ICD-10-CM | POA: Diagnosis not present

## 2016-07-24 DIAGNOSIS — D090 Carcinoma in situ of bladder: Secondary | ICD-10-CM | POA: Diagnosis not present

## 2016-07-26 DIAGNOSIS — D090 Carcinoma in situ of bladder: Secondary | ICD-10-CM | POA: Diagnosis not present

## 2016-07-26 DIAGNOSIS — F419 Anxiety disorder, unspecified: Secondary | ICD-10-CM | POA: Diagnosis not present

## 2016-07-26 DIAGNOSIS — M5136 Other intervertebral disc degeneration, lumbar region: Secondary | ICD-10-CM | POA: Diagnosis not present

## 2016-07-26 DIAGNOSIS — Z436 Encounter for attention to other artificial openings of urinary tract: Secondary | ICD-10-CM | POA: Diagnosis not present

## 2016-07-26 DIAGNOSIS — I10 Essential (primary) hypertension: Secondary | ICD-10-CM | POA: Diagnosis not present

## 2016-07-26 DIAGNOSIS — Z483 Aftercare following surgery for neoplasm: Secondary | ICD-10-CM | POA: Diagnosis not present

## 2016-07-31 DIAGNOSIS — Z483 Aftercare following surgery for neoplasm: Secondary | ICD-10-CM | POA: Diagnosis not present

## 2016-07-31 DIAGNOSIS — I10 Essential (primary) hypertension: Secondary | ICD-10-CM | POA: Diagnosis not present

## 2016-07-31 DIAGNOSIS — M5136 Other intervertebral disc degeneration, lumbar region: Secondary | ICD-10-CM | POA: Diagnosis not present

## 2016-07-31 DIAGNOSIS — F419 Anxiety disorder, unspecified: Secondary | ICD-10-CM | POA: Diagnosis not present

## 2016-07-31 DIAGNOSIS — D090 Carcinoma in situ of bladder: Secondary | ICD-10-CM | POA: Diagnosis not present

## 2016-07-31 DIAGNOSIS — Z436 Encounter for attention to other artificial openings of urinary tract: Secondary | ICD-10-CM | POA: Diagnosis not present

## 2016-08-01 DIAGNOSIS — F419 Anxiety disorder, unspecified: Secondary | ICD-10-CM | POA: Diagnosis not present

## 2016-08-01 DIAGNOSIS — D090 Carcinoma in situ of bladder: Secondary | ICD-10-CM | POA: Diagnosis not present

## 2016-08-01 DIAGNOSIS — Z483 Aftercare following surgery for neoplasm: Secondary | ICD-10-CM | POA: Diagnosis not present

## 2016-08-01 DIAGNOSIS — I10 Essential (primary) hypertension: Secondary | ICD-10-CM | POA: Diagnosis not present

## 2016-08-01 DIAGNOSIS — Z436 Encounter for attention to other artificial openings of urinary tract: Secondary | ICD-10-CM | POA: Diagnosis not present

## 2016-08-01 DIAGNOSIS — M5136 Other intervertebral disc degeneration, lumbar region: Secondary | ICD-10-CM | POA: Diagnosis not present

## 2016-08-03 DIAGNOSIS — Z436 Encounter for attention to other artificial openings of urinary tract: Secondary | ICD-10-CM | POA: Diagnosis not present

## 2016-08-03 DIAGNOSIS — I10 Essential (primary) hypertension: Secondary | ICD-10-CM | POA: Diagnosis not present

## 2016-08-03 DIAGNOSIS — D090 Carcinoma in situ of bladder: Secondary | ICD-10-CM | POA: Diagnosis not present

## 2016-08-03 DIAGNOSIS — M5136 Other intervertebral disc degeneration, lumbar region: Secondary | ICD-10-CM | POA: Diagnosis not present

## 2016-08-03 DIAGNOSIS — Z483 Aftercare following surgery for neoplasm: Secondary | ICD-10-CM | POA: Diagnosis not present

## 2016-08-03 DIAGNOSIS — F419 Anxiety disorder, unspecified: Secondary | ICD-10-CM | POA: Diagnosis not present

## 2016-08-07 DIAGNOSIS — Z436 Encounter for attention to other artificial openings of urinary tract: Secondary | ICD-10-CM | POA: Diagnosis not present

## 2016-08-07 DIAGNOSIS — D090 Carcinoma in situ of bladder: Secondary | ICD-10-CM | POA: Diagnosis not present

## 2016-08-07 DIAGNOSIS — I10 Essential (primary) hypertension: Secondary | ICD-10-CM | POA: Diagnosis not present

## 2016-08-07 DIAGNOSIS — F419 Anxiety disorder, unspecified: Secondary | ICD-10-CM | POA: Diagnosis not present

## 2016-08-07 DIAGNOSIS — M5136 Other intervertebral disc degeneration, lumbar region: Secondary | ICD-10-CM | POA: Diagnosis not present

## 2016-08-07 DIAGNOSIS — Z483 Aftercare following surgery for neoplasm: Secondary | ICD-10-CM | POA: Diagnosis not present

## 2016-08-08 DIAGNOSIS — Z436 Encounter for attention to other artificial openings of urinary tract: Secondary | ICD-10-CM | POA: Diagnosis not present

## 2016-08-08 DIAGNOSIS — D090 Carcinoma in situ of bladder: Secondary | ICD-10-CM | POA: Diagnosis not present

## 2016-08-08 DIAGNOSIS — M5136 Other intervertebral disc degeneration, lumbar region: Secondary | ICD-10-CM | POA: Diagnosis not present

## 2016-08-08 DIAGNOSIS — F419 Anxiety disorder, unspecified: Secondary | ICD-10-CM | POA: Diagnosis not present

## 2016-08-08 DIAGNOSIS — Z483 Aftercare following surgery for neoplasm: Secondary | ICD-10-CM | POA: Diagnosis not present

## 2016-08-08 DIAGNOSIS — I10 Essential (primary) hypertension: Secondary | ICD-10-CM | POA: Diagnosis not present

## 2016-08-10 DIAGNOSIS — Z436 Encounter for attention to other artificial openings of urinary tract: Secondary | ICD-10-CM | POA: Diagnosis not present

## 2016-08-10 DIAGNOSIS — F419 Anxiety disorder, unspecified: Secondary | ICD-10-CM | POA: Diagnosis not present

## 2016-08-10 DIAGNOSIS — I10 Essential (primary) hypertension: Secondary | ICD-10-CM | POA: Diagnosis not present

## 2016-08-10 DIAGNOSIS — D090 Carcinoma in situ of bladder: Secondary | ICD-10-CM | POA: Diagnosis not present

## 2016-08-10 DIAGNOSIS — Z483 Aftercare following surgery for neoplasm: Secondary | ICD-10-CM | POA: Diagnosis not present

## 2016-08-10 DIAGNOSIS — M5136 Other intervertebral disc degeneration, lumbar region: Secondary | ICD-10-CM | POA: Diagnosis not present

## 2016-08-14 DIAGNOSIS — Z436 Encounter for attention to other artificial openings of urinary tract: Secondary | ICD-10-CM | POA: Diagnosis not present

## 2016-08-14 DIAGNOSIS — D090 Carcinoma in situ of bladder: Secondary | ICD-10-CM | POA: Diagnosis not present

## 2016-08-14 DIAGNOSIS — F419 Anxiety disorder, unspecified: Secondary | ICD-10-CM | POA: Diagnosis not present

## 2016-08-14 DIAGNOSIS — Z483 Aftercare following surgery for neoplasm: Secondary | ICD-10-CM | POA: Diagnosis not present

## 2016-08-14 DIAGNOSIS — M5136 Other intervertebral disc degeneration, lumbar region: Secondary | ICD-10-CM | POA: Diagnosis not present

## 2016-08-14 DIAGNOSIS — I10 Essential (primary) hypertension: Secondary | ICD-10-CM | POA: Diagnosis not present

## 2016-08-15 DIAGNOSIS — M5136 Other intervertebral disc degeneration, lumbar region: Secondary | ICD-10-CM | POA: Diagnosis not present

## 2016-08-15 DIAGNOSIS — F419 Anxiety disorder, unspecified: Secondary | ICD-10-CM | POA: Diagnosis not present

## 2016-08-15 DIAGNOSIS — Z436 Encounter for attention to other artificial openings of urinary tract: Secondary | ICD-10-CM | POA: Diagnosis not present

## 2016-08-15 DIAGNOSIS — Z483 Aftercare following surgery for neoplasm: Secondary | ICD-10-CM | POA: Diagnosis not present

## 2016-08-15 DIAGNOSIS — D090 Carcinoma in situ of bladder: Secondary | ICD-10-CM | POA: Diagnosis not present

## 2016-08-15 DIAGNOSIS — I10 Essential (primary) hypertension: Secondary | ICD-10-CM | POA: Diagnosis not present

## 2016-08-17 DIAGNOSIS — D090 Carcinoma in situ of bladder: Secondary | ICD-10-CM | POA: Diagnosis not present

## 2016-08-17 DIAGNOSIS — M5136 Other intervertebral disc degeneration, lumbar region: Secondary | ICD-10-CM | POA: Diagnosis not present

## 2016-08-17 DIAGNOSIS — Z436 Encounter for attention to other artificial openings of urinary tract: Secondary | ICD-10-CM | POA: Diagnosis not present

## 2016-08-17 DIAGNOSIS — Z483 Aftercare following surgery for neoplasm: Secondary | ICD-10-CM | POA: Diagnosis not present

## 2016-08-17 DIAGNOSIS — I10 Essential (primary) hypertension: Secondary | ICD-10-CM | POA: Diagnosis not present

## 2016-08-17 DIAGNOSIS — F419 Anxiety disorder, unspecified: Secondary | ICD-10-CM | POA: Diagnosis not present

## 2016-11-27 DIAGNOSIS — H52203 Unspecified astigmatism, bilateral: Secondary | ICD-10-CM | POA: Diagnosis not present

## 2016-11-27 DIAGNOSIS — H31002 Unspecified chorioretinal scars, left eye: Secondary | ICD-10-CM | POA: Diagnosis not present

## 2016-11-27 DIAGNOSIS — H43813 Vitreous degeneration, bilateral: Secondary | ICD-10-CM | POA: Diagnosis not present

## 2016-11-27 DIAGNOSIS — H26493 Other secondary cataract, bilateral: Secondary | ICD-10-CM | POA: Diagnosis not present

## 2016-12-19 DIAGNOSIS — D090 Carcinoma in situ of bladder: Secondary | ICD-10-CM | POA: Diagnosis not present

## 2016-12-21 DIAGNOSIS — C678 Malignant neoplasm of overlapping sites of bladder: Secondary | ICD-10-CM | POA: Diagnosis not present

## 2016-12-21 DIAGNOSIS — R935 Abnormal findings on diagnostic imaging of other abdominal regions, including retroperitoneum: Secondary | ICD-10-CM | POA: Diagnosis not present

## 2016-12-26 DIAGNOSIS — D090 Carcinoma in situ of bladder: Secondary | ICD-10-CM | POA: Diagnosis not present

## 2016-12-26 DIAGNOSIS — C61 Malignant neoplasm of prostate: Secondary | ICD-10-CM | POA: Diagnosis not present

## 2016-12-26 DIAGNOSIS — E038 Other specified hypothyroidism: Secondary | ICD-10-CM | POA: Diagnosis not present

## 2016-12-26 DIAGNOSIS — R8290 Unspecified abnormal findings in urine: Secondary | ICD-10-CM | POA: Diagnosis not present

## 2016-12-26 DIAGNOSIS — I10 Essential (primary) hypertension: Secondary | ICD-10-CM | POA: Diagnosis not present

## 2016-12-26 DIAGNOSIS — E784 Other hyperlipidemia: Secondary | ICD-10-CM | POA: Diagnosis not present

## 2016-12-26 DIAGNOSIS — Z125 Encounter for screening for malignant neoplasm of prostate: Secondary | ICD-10-CM | POA: Diagnosis not present

## 2016-12-26 DIAGNOSIS — R358 Other polyuria: Secondary | ICD-10-CM | POA: Diagnosis not present

## 2017-01-01 DIAGNOSIS — Z1212 Encounter for screening for malignant neoplasm of rectum: Secondary | ICD-10-CM | POA: Diagnosis not present

## 2017-01-03 DIAGNOSIS — F329 Major depressive disorder, single episode, unspecified: Secondary | ICD-10-CM | POA: Diagnosis not present

## 2017-01-03 DIAGNOSIS — C679 Malignant neoplasm of bladder, unspecified: Secondary | ICD-10-CM | POA: Diagnosis not present

## 2017-01-03 DIAGNOSIS — I10 Essential (primary) hypertension: Secondary | ICD-10-CM | POA: Diagnosis not present

## 2017-01-03 DIAGNOSIS — Z6822 Body mass index (BMI) 22.0-22.9, adult: Secondary | ICD-10-CM | POA: Diagnosis not present

## 2017-01-03 DIAGNOSIS — Z0000 Encounter for general adult medical examination without abnormal findings: Secondary | ICD-10-CM | POA: Diagnosis not present

## 2017-01-03 DIAGNOSIS — J3089 Other allergic rhinitis: Secondary | ICD-10-CM | POA: Diagnosis not present

## 2017-01-03 DIAGNOSIS — M7989 Other specified soft tissue disorders: Secondary | ICD-10-CM | POA: Diagnosis not present

## 2017-01-03 DIAGNOSIS — Z1389 Encounter for screening for other disorder: Secondary | ICD-10-CM | POA: Diagnosis not present

## 2017-01-03 DIAGNOSIS — C61 Malignant neoplasm of prostate: Secondary | ICD-10-CM | POA: Diagnosis not present

## 2017-01-03 DIAGNOSIS — M48061 Spinal stenosis, lumbar region without neurogenic claudication: Secondary | ICD-10-CM | POA: Diagnosis not present

## 2017-01-03 DIAGNOSIS — M545 Low back pain: Secondary | ICD-10-CM | POA: Diagnosis not present

## 2017-01-03 DIAGNOSIS — I872 Venous insufficiency (chronic) (peripheral): Secondary | ICD-10-CM | POA: Diagnosis not present

## 2017-02-03 IMAGING — CT CT CORE BIOPSY RENAL
2 of 5 series · 12 of 32 positions shown, 18 images · non-contrast
Comparison: none

CLINICAL DATA: Fever, Malaise, Abdominal Fluid Collection & Wound
Erythema - s/p cystectomy / ileal conduit 06/28/16. New fever to 102
at home x several and worsening weakness with malaise. Oral + IV
contrast CT in ER with 8cm fluid density collection near conduit
without gas / or extravasation but some mass effect on
ureteral-enteric anastomeses. Exam with wound erythema and drainage
likely from collection below.

[Series 3: rtn a/p w/o · axial · non-contrast · 0.66mm/px · z∈[-341,-241]mm · 10 of 26 slices shown, 16 images (1 of 2)]
[im 3/26  soft-tissue]
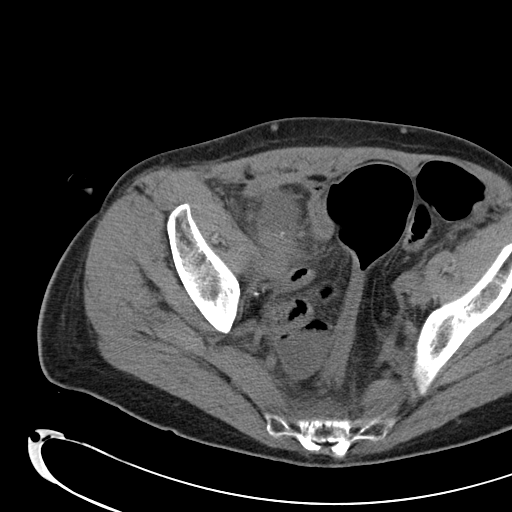
[im 3/26  bone]
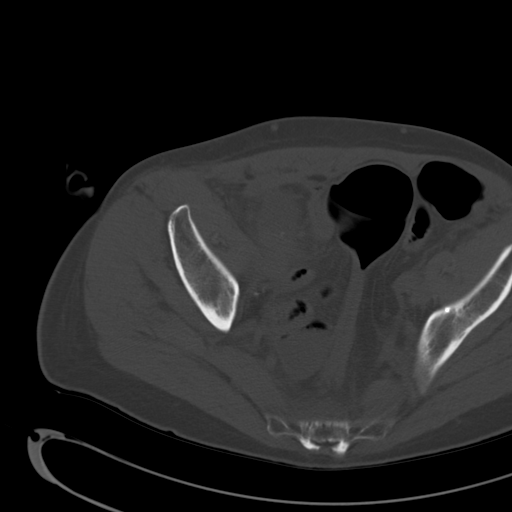
[im 5/26  soft-tissue]
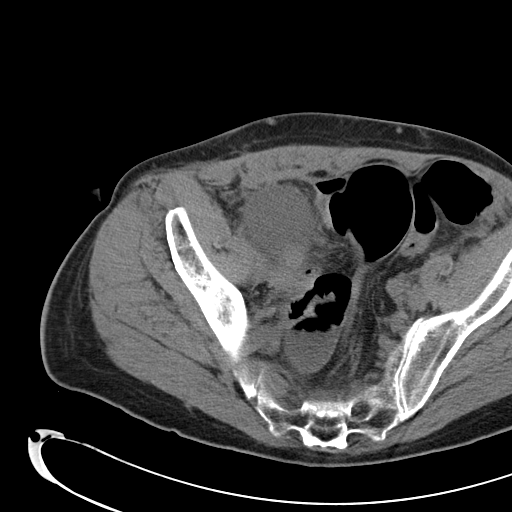
[im 7/26  soft-tissue]
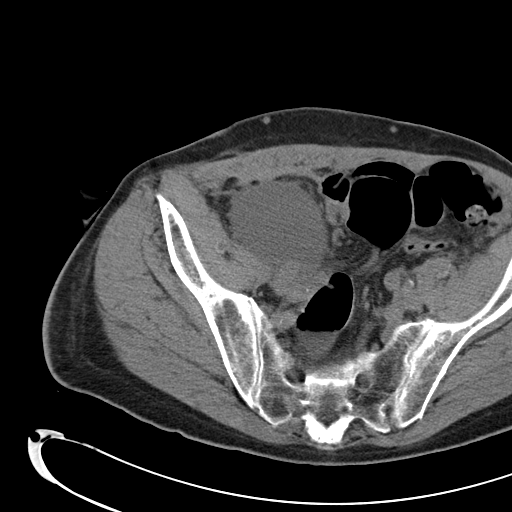
[im 10/26  soft-tissue]
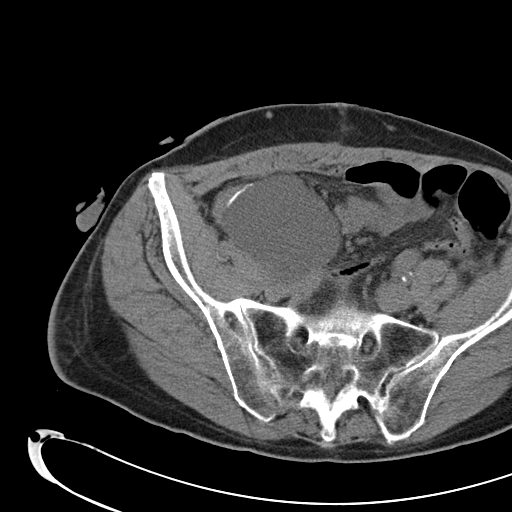
[im 12/26  soft-tissue]
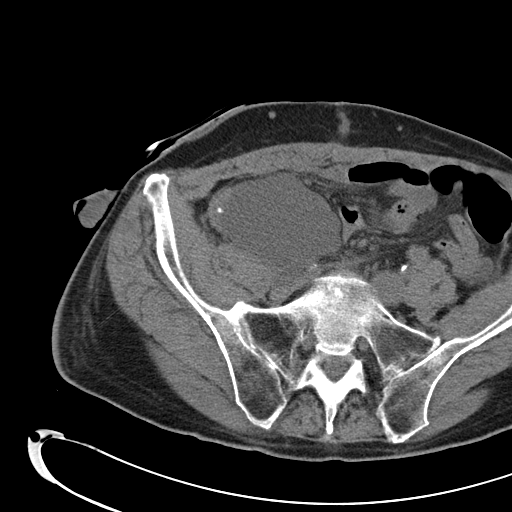
[im 14/26  soft-tissue]
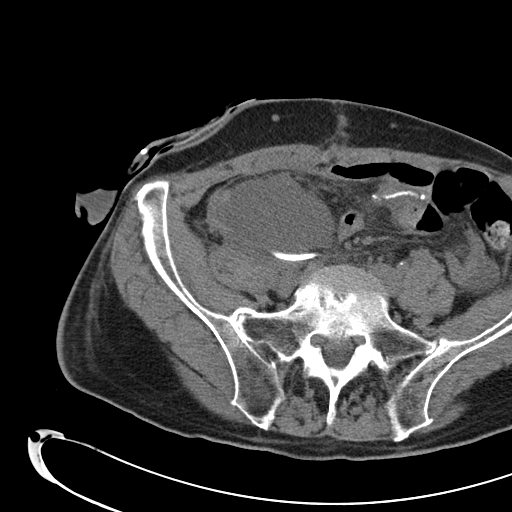
[im 16/26  soft-tissue]
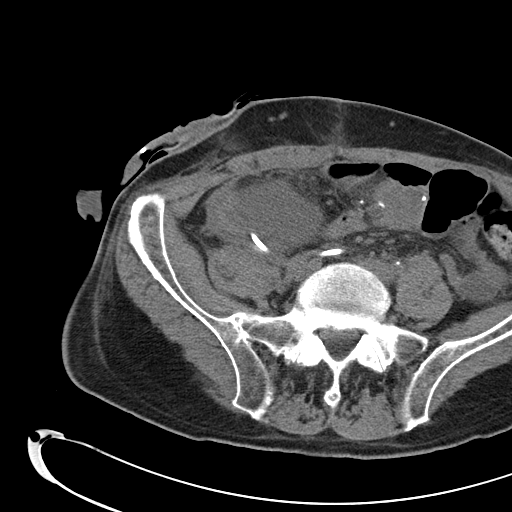
[im 16/26  lung]
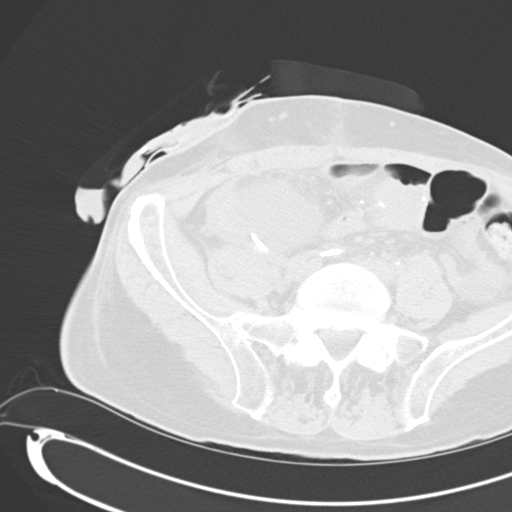
[im 19/26  soft-tissue]
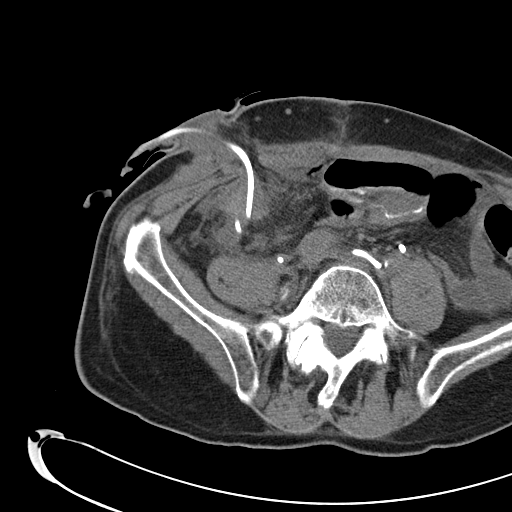
[im 19/26  lung]
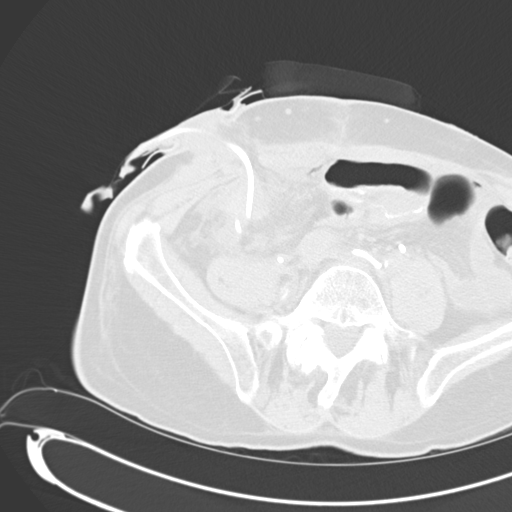
[im 21/26  soft-tissue]
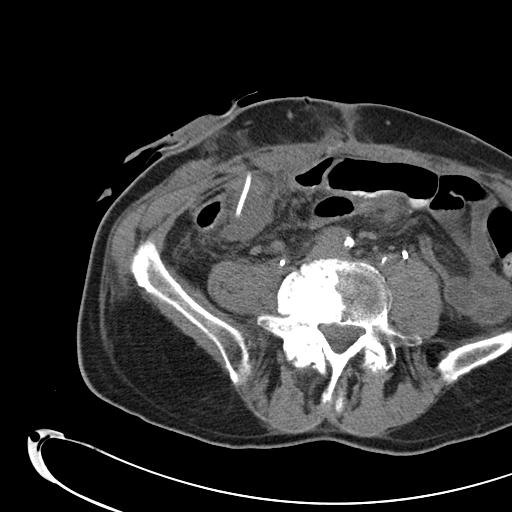
[im 21/26  lung]
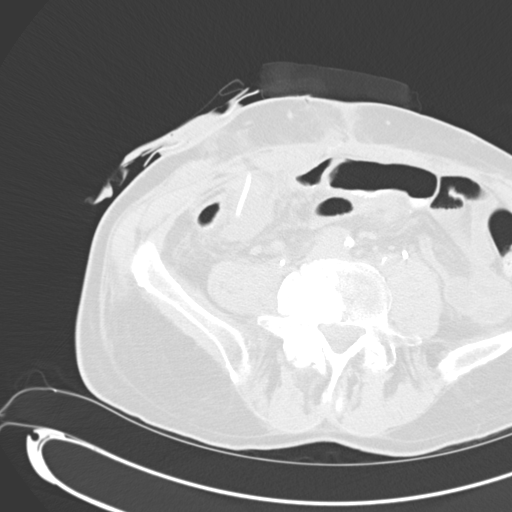
[im 21/26  bone]
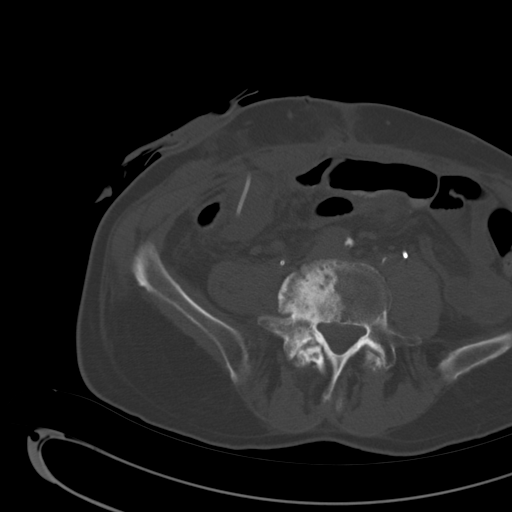
[im 23/26  soft-tissue]
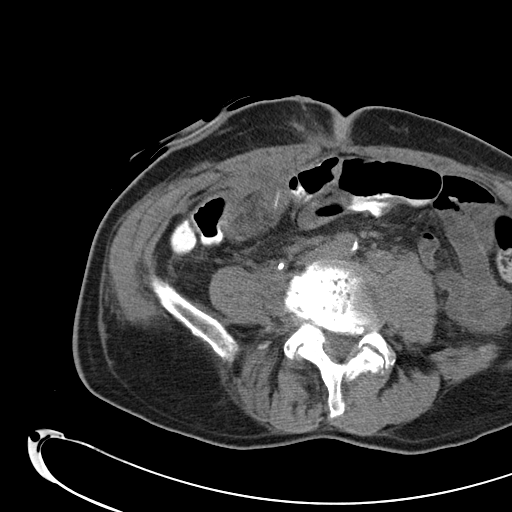
[im 23/26  lung]
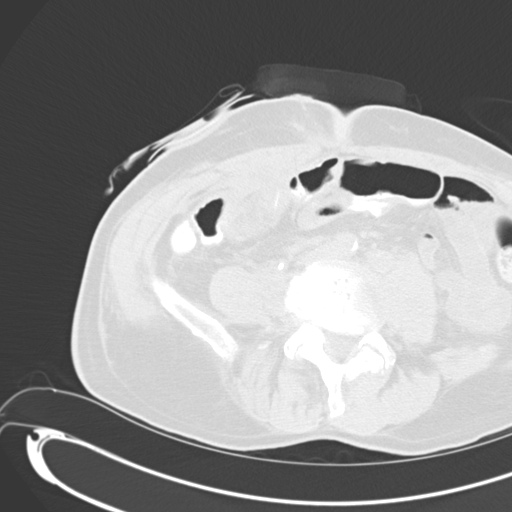

[Series 7: rtn a/p w/o · axial · non-contrast · 0.66mm/px · z∈[-346,-336]mm · 2 of 15 slices shown (2 of 2)]
[im 3/15  soft-tissue]
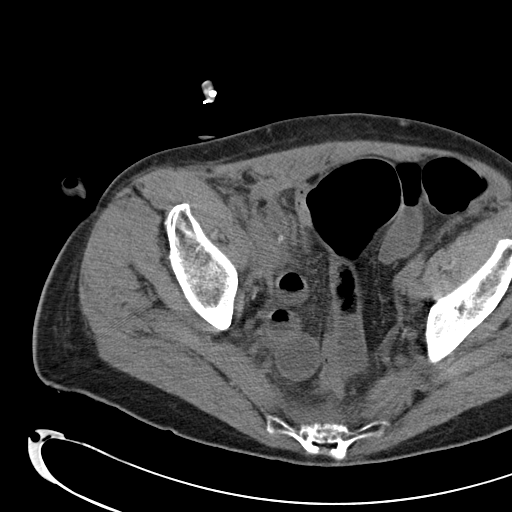
[im 5/15  soft-tissue]
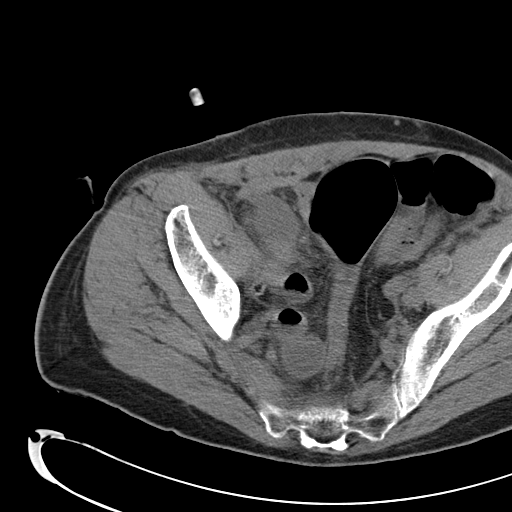

[12 of 32 positions shown; findings below may reference images not displayed]

EXAM:
CT GUIDED DRAINAGE OF RIGHT LOWER QUADRANT PERITONEAL ABSCESS

ANESTHESIA/SEDATION:
Intravenous Fentanyl and Versed were administered as conscious
sedation during continuous monitoring of the patient's level of
consciousness and physiological / cardiorespiratory status by the
radiology RN, with a total moderate sedation time of 43 minutes.

PROCEDURE:
The procedure, risks, benefits, and alternatives were explained to
the patient. Questions regarding the procedure were encouraged and
answered. The patient understands and consents to the procedure.

Select axial scans through the abdomen were obtained. The right
lower quadrant collection was localized and an appropriate skin
entry site was determined and marked.

The operative field was prepped with chlorhexidinein a sterile
fashion, and a sterile drape was applied covering the operative
field. A sterile gown and sterile gloves were used for the
procedure. Local anesthesia was provided with 1% Lidocaine.

Under intermittent CT guidance, a 19 gauge percutaneous entry needle
was advanced into the collection. Thin yellow fluid returned
spontaneously through the needle hub. An Amplatz wire advanced
easily, position within the collection confirmed on CT. Tract
dilated to facilitate placement of a 12 French pigtail drain
catheter, formed within the dependent aspect of the collection.
Position confirmed on CT. Catheter secured externally with 0 Prolene
suture and StatLock and placed to gravity drain bag. 20 mL of the
slightly cloudy yellow aspirate were sent for Gram stain, culture
and sensitivity. The patient tolerated the procedure well.

COMPLICATIONS:
None immediate
FINDINGS: Unilocular right lower quadrant collection adjacent to the surgical
anastomosis was again localized. Twelve French pigtail drain
catheter placed under CT guidance as above. Sample of the aspirate
sent for Gram stain and culture.
IMPRESSION: 1. Technically successful right lower quadrant peritoneal abscess
drain catheter placement under CT guidance.

## 2017-04-15 DIAGNOSIS — H6122 Impacted cerumen, left ear: Secondary | ICD-10-CM | POA: Diagnosis not present

## 2017-05-02 DIAGNOSIS — D225 Melanocytic nevi of trunk: Secondary | ICD-10-CM | POA: Diagnosis not present

## 2017-05-02 DIAGNOSIS — L821 Other seborrheic keratosis: Secondary | ICD-10-CM | POA: Diagnosis not present

## 2017-05-02 DIAGNOSIS — L570 Actinic keratosis: Secondary | ICD-10-CM | POA: Diagnosis not present

## 2017-05-02 DIAGNOSIS — D1801 Hemangioma of skin and subcutaneous tissue: Secondary | ICD-10-CM | POA: Diagnosis not present

## 2017-05-02 DIAGNOSIS — Z85828 Personal history of other malignant neoplasm of skin: Secondary | ICD-10-CM | POA: Diagnosis not present

## 2017-05-03 DIAGNOSIS — C61 Malignant neoplasm of prostate: Secondary | ICD-10-CM | POA: Diagnosis not present

## 2017-05-03 DIAGNOSIS — C678 Malignant neoplasm of overlapping sites of bladder: Secondary | ICD-10-CM | POA: Diagnosis not present

## 2017-07-08 DIAGNOSIS — I10 Essential (primary) hypertension: Secondary | ICD-10-CM | POA: Diagnosis not present

## 2017-07-08 DIAGNOSIS — F5221 Male erectile disorder: Secondary | ICD-10-CM | POA: Diagnosis not present

## 2017-07-08 DIAGNOSIS — F329 Major depressive disorder, single episode, unspecified: Secondary | ICD-10-CM | POA: Diagnosis not present

## 2017-07-08 DIAGNOSIS — I872 Venous insufficiency (chronic) (peripheral): Secondary | ICD-10-CM | POA: Diagnosis not present

## 2017-07-08 DIAGNOSIS — C679 Malignant neoplasm of bladder, unspecified: Secondary | ICD-10-CM | POA: Diagnosis not present

## 2017-07-08 DIAGNOSIS — J309 Allergic rhinitis, unspecified: Secondary | ICD-10-CM | POA: Diagnosis not present

## 2017-07-08 DIAGNOSIS — M545 Low back pain: Secondary | ICD-10-CM | POA: Diagnosis not present

## 2017-07-08 DIAGNOSIS — C61 Malignant neoplasm of prostate: Secondary | ICD-10-CM | POA: Diagnosis not present

## 2017-07-08 DIAGNOSIS — Z23 Encounter for immunization: Secondary | ICD-10-CM | POA: Diagnosis not present

## 2017-07-08 DIAGNOSIS — M7989 Other specified soft tissue disorders: Secondary | ICD-10-CM | POA: Diagnosis not present

## 2017-07-08 DIAGNOSIS — E038 Other specified hypothyroidism: Secondary | ICD-10-CM | POA: Diagnosis not present

## 2017-07-08 DIAGNOSIS — E784 Other hyperlipidemia: Secondary | ICD-10-CM | POA: Diagnosis not present

## 2017-08-05 DIAGNOSIS — C61 Malignant neoplasm of prostate: Secondary | ICD-10-CM | POA: Diagnosis not present

## 2017-08-05 DIAGNOSIS — D090 Carcinoma in situ of bladder: Secondary | ICD-10-CM | POA: Diagnosis not present

## 2017-08-06 DIAGNOSIS — J9811 Atelectasis: Secondary | ICD-10-CM | POA: Diagnosis not present

## 2017-08-06 DIAGNOSIS — R911 Solitary pulmonary nodule: Secondary | ICD-10-CM | POA: Diagnosis not present

## 2017-08-06 DIAGNOSIS — C678 Malignant neoplasm of overlapping sites of bladder: Secondary | ICD-10-CM | POA: Diagnosis not present

## 2017-08-06 DIAGNOSIS — N201 Calculus of ureter: Secondary | ICD-10-CM | POA: Diagnosis not present

## 2017-08-06 DIAGNOSIS — R918 Other nonspecific abnormal finding of lung field: Secondary | ICD-10-CM | POA: Diagnosis not present

## 2017-08-14 DIAGNOSIS — D090 Carcinoma in situ of bladder: Secondary | ICD-10-CM | POA: Diagnosis not present

## 2017-08-14 DIAGNOSIS — C61 Malignant neoplasm of prostate: Secondary | ICD-10-CM | POA: Diagnosis not present

## 2017-09-13 ENCOUNTER — Encounter: Payer: Self-pay | Admitting: Internal Medicine

## 2017-09-27 DIAGNOSIS — Z6823 Body mass index (BMI) 23.0-23.9, adult: Secondary | ICD-10-CM | POA: Diagnosis not present

## 2017-09-27 DIAGNOSIS — R531 Weakness: Secondary | ICD-10-CM | POA: Diagnosis not present

## 2017-09-27 DIAGNOSIS — I10 Essential (primary) hypertension: Secondary | ICD-10-CM | POA: Diagnosis not present

## 2017-09-27 DIAGNOSIS — N390 Urinary tract infection, site not specified: Secondary | ICD-10-CM | POA: Diagnosis not present

## 2017-10-11 DIAGNOSIS — I10 Essential (primary) hypertension: Secondary | ICD-10-CM | POA: Diagnosis not present

## 2017-11-29 DIAGNOSIS — H52203 Unspecified astigmatism, bilateral: Secondary | ICD-10-CM | POA: Diagnosis not present

## 2017-11-29 DIAGNOSIS — H31002 Unspecified chorioretinal scars, left eye: Secondary | ICD-10-CM | POA: Diagnosis not present

## 2017-11-29 DIAGNOSIS — H26493 Other secondary cataract, bilateral: Secondary | ICD-10-CM | POA: Diagnosis not present

## 2017-11-29 DIAGNOSIS — H43813 Vitreous degeneration, bilateral: Secondary | ICD-10-CM | POA: Diagnosis not present

## 2018-01-06 DIAGNOSIS — M48061 Spinal stenosis, lumbar region without neurogenic claudication: Secondary | ICD-10-CM | POA: Diagnosis not present

## 2018-01-06 DIAGNOSIS — Z1389 Encounter for screening for other disorder: Secondary | ICD-10-CM | POA: Diagnosis not present

## 2018-01-06 DIAGNOSIS — C679 Malignant neoplasm of bladder, unspecified: Secondary | ICD-10-CM | POA: Diagnosis not present

## 2018-01-06 DIAGNOSIS — I10 Essential (primary) hypertension: Secondary | ICD-10-CM | POA: Diagnosis not present

## 2018-01-06 DIAGNOSIS — C61 Malignant neoplasm of prostate: Secondary | ICD-10-CM | POA: Diagnosis not present

## 2018-01-06 DIAGNOSIS — I872 Venous insufficiency (chronic) (peripheral): Secondary | ICD-10-CM | POA: Diagnosis not present

## 2018-01-06 DIAGNOSIS — I700 Atherosclerosis of aorta: Secondary | ICD-10-CM | POA: Diagnosis not present

## 2018-01-06 DIAGNOSIS — E038 Other specified hypothyroidism: Secondary | ICD-10-CM | POA: Diagnosis not present

## 2018-01-06 DIAGNOSIS — M545 Low back pain: Secondary | ICD-10-CM | POA: Diagnosis not present

## 2018-01-06 DIAGNOSIS — E7849 Other hyperlipidemia: Secondary | ICD-10-CM | POA: Diagnosis not present

## 2018-01-06 DIAGNOSIS — Z936 Other artificial openings of urinary tract status: Secondary | ICD-10-CM | POA: Diagnosis not present

## 2018-01-06 DIAGNOSIS — M7989 Other specified soft tissue disorders: Secondary | ICD-10-CM | POA: Diagnosis not present

## 2018-02-18 DIAGNOSIS — D090 Carcinoma in situ of bladder: Secondary | ICD-10-CM | POA: Diagnosis not present

## 2018-02-21 DIAGNOSIS — C678 Malignant neoplasm of overlapping sites of bladder: Secondary | ICD-10-CM | POA: Diagnosis not present

## 2018-02-21 DIAGNOSIS — R911 Solitary pulmonary nodule: Secondary | ICD-10-CM | POA: Diagnosis not present

## 2018-02-21 DIAGNOSIS — D090 Carcinoma in situ of bladder: Secondary | ICD-10-CM | POA: Diagnosis not present

## 2018-02-26 DIAGNOSIS — D090 Carcinoma in situ of bladder: Secondary | ICD-10-CM | POA: Diagnosis not present

## 2018-02-26 DIAGNOSIS — C61 Malignant neoplasm of prostate: Secondary | ICD-10-CM | POA: Diagnosis not present

## 2018-05-08 DIAGNOSIS — D225 Melanocytic nevi of trunk: Secondary | ICD-10-CM | POA: Diagnosis not present

## 2018-05-08 DIAGNOSIS — L821 Other seborrheic keratosis: Secondary | ICD-10-CM | POA: Diagnosis not present

## 2018-05-08 DIAGNOSIS — Z85828 Personal history of other malignant neoplasm of skin: Secondary | ICD-10-CM | POA: Diagnosis not present

## 2018-07-02 DIAGNOSIS — Z125 Encounter for screening for malignant neoplasm of prostate: Secondary | ICD-10-CM | POA: Diagnosis not present

## 2018-07-02 DIAGNOSIS — I10 Essential (primary) hypertension: Secondary | ICD-10-CM | POA: Diagnosis not present

## 2018-07-02 DIAGNOSIS — E038 Other specified hypothyroidism: Secondary | ICD-10-CM | POA: Diagnosis not present

## 2018-07-02 DIAGNOSIS — E7849 Other hyperlipidemia: Secondary | ICD-10-CM | POA: Diagnosis not present

## 2018-07-09 DIAGNOSIS — I872 Venous insufficiency (chronic) (peripheral): Secondary | ICD-10-CM | POA: Diagnosis not present

## 2018-07-09 DIAGNOSIS — M7989 Other specified soft tissue disorders: Secondary | ICD-10-CM | POA: Diagnosis not present

## 2018-07-09 DIAGNOSIS — M48061 Spinal stenosis, lumbar region without neurogenic claudication: Secondary | ICD-10-CM | POA: Diagnosis not present

## 2018-07-09 DIAGNOSIS — M545 Low back pain: Secondary | ICD-10-CM | POA: Diagnosis not present

## 2018-07-09 DIAGNOSIS — Z0000 Encounter for general adult medical examination without abnormal findings: Secondary | ICD-10-CM | POA: Diagnosis not present

## 2018-07-09 DIAGNOSIS — E038 Other specified hypothyroidism: Secondary | ICD-10-CM | POA: Diagnosis not present

## 2018-07-09 DIAGNOSIS — I700 Atherosclerosis of aorta: Secondary | ICD-10-CM | POA: Diagnosis not present

## 2018-07-09 DIAGNOSIS — Z23 Encounter for immunization: Secondary | ICD-10-CM | POA: Diagnosis not present

## 2018-07-09 DIAGNOSIS — C679 Malignant neoplasm of bladder, unspecified: Secondary | ICD-10-CM | POA: Diagnosis not present

## 2018-07-09 DIAGNOSIS — E7849 Other hyperlipidemia: Secondary | ICD-10-CM | POA: Diagnosis not present

## 2018-07-09 DIAGNOSIS — Z936 Other artificial openings of urinary tract status: Secondary | ICD-10-CM | POA: Diagnosis not present

## 2018-07-09 DIAGNOSIS — I10 Essential (primary) hypertension: Secondary | ICD-10-CM | POA: Diagnosis not present

## 2018-07-10 DIAGNOSIS — Z1212 Encounter for screening for malignant neoplasm of rectum: Secondary | ICD-10-CM | POA: Diagnosis not present

## 2018-07-18 DIAGNOSIS — H6122 Impacted cerumen, left ear: Secondary | ICD-10-CM | POA: Diagnosis not present

## 2018-09-25 DIAGNOSIS — C61 Malignant neoplasm of prostate: Secondary | ICD-10-CM | POA: Diagnosis not present

## 2018-09-26 DIAGNOSIS — N1339 Other hydronephrosis: Secondary | ICD-10-CM | POA: Diagnosis not present

## 2018-09-26 DIAGNOSIS — C678 Malignant neoplasm of overlapping sites of bladder: Secondary | ICD-10-CM | POA: Diagnosis not present

## 2018-09-26 DIAGNOSIS — R918 Other nonspecific abnormal finding of lung field: Secondary | ICD-10-CM | POA: Diagnosis not present

## 2018-09-30 DIAGNOSIS — C61 Malignant neoplasm of prostate: Secondary | ICD-10-CM | POA: Diagnosis not present

## 2018-09-30 DIAGNOSIS — D090 Carcinoma in situ of bladder: Secondary | ICD-10-CM | POA: Diagnosis not present

## 2018-12-02 DIAGNOSIS — H0100A Unspecified blepharitis right eye, upper and lower eyelids: Secondary | ICD-10-CM | POA: Diagnosis not present

## 2018-12-02 DIAGNOSIS — H26493 Other secondary cataract, bilateral: Secondary | ICD-10-CM | POA: Diagnosis not present

## 2018-12-02 DIAGNOSIS — H43813 Vitreous degeneration, bilateral: Secondary | ICD-10-CM | POA: Diagnosis not present

## 2018-12-02 DIAGNOSIS — H524 Presbyopia: Secondary | ICD-10-CM | POA: Diagnosis not present

## 2018-12-25 DIAGNOSIS — Z85828 Personal history of other malignant neoplasm of skin: Secondary | ICD-10-CM | POA: Diagnosis not present

## 2018-12-25 DIAGNOSIS — L821 Other seborrheic keratosis: Secondary | ICD-10-CM | POA: Diagnosis not present

## 2019-01-07 DIAGNOSIS — E038 Other specified hypothyroidism: Secondary | ICD-10-CM | POA: Diagnosis not present

## 2019-01-07 DIAGNOSIS — M545 Low back pain: Secondary | ICD-10-CM | POA: Diagnosis not present

## 2019-01-07 DIAGNOSIS — Z936 Other artificial openings of urinary tract status: Secondary | ICD-10-CM | POA: Diagnosis not present

## 2019-01-07 DIAGNOSIS — M48061 Spinal stenosis, lumbar region without neurogenic claudication: Secondary | ICD-10-CM | POA: Diagnosis not present

## 2019-01-07 DIAGNOSIS — I10 Essential (primary) hypertension: Secondary | ICD-10-CM | POA: Diagnosis not present

## 2019-01-07 DIAGNOSIS — M7989 Other specified soft tissue disorders: Secondary | ICD-10-CM | POA: Diagnosis not present

## 2019-01-07 DIAGNOSIS — C679 Malignant neoplasm of bladder, unspecified: Secondary | ICD-10-CM | POA: Diagnosis not present

## 2019-01-07 DIAGNOSIS — K219 Gastro-esophageal reflux disease without esophagitis: Secondary | ICD-10-CM | POA: Diagnosis not present

## 2019-01-07 DIAGNOSIS — I700 Atherosclerosis of aorta: Secondary | ICD-10-CM | POA: Diagnosis not present

## 2019-01-07 DIAGNOSIS — E7849 Other hyperlipidemia: Secondary | ICD-10-CM | POA: Diagnosis not present

## 2019-01-07 DIAGNOSIS — Z1331 Encounter for screening for depression: Secondary | ICD-10-CM | POA: Diagnosis not present

## 2019-01-07 DIAGNOSIS — I872 Venous insufficiency (chronic) (peripheral): Secondary | ICD-10-CM | POA: Diagnosis not present

## 2019-01-21 DIAGNOSIS — R05 Cough: Secondary | ICD-10-CM | POA: Diagnosis not present

## 2019-01-21 DIAGNOSIS — I10 Essential (primary) hypertension: Secondary | ICD-10-CM | POA: Diagnosis not present

## 2019-04-03 DIAGNOSIS — R300 Dysuria: Secondary | ICD-10-CM | POA: Diagnosis not present

## 2019-04-08 DIAGNOSIS — K219 Gastro-esophageal reflux disease without esophagitis: Secondary | ICD-10-CM | POA: Diagnosis not present

## 2019-04-08 DIAGNOSIS — E039 Hypothyroidism, unspecified: Secondary | ICD-10-CM | POA: Diagnosis not present

## 2019-04-08 DIAGNOSIS — M48061 Spinal stenosis, lumbar region without neurogenic claudication: Secondary | ICD-10-CM | POA: Diagnosis not present

## 2019-04-08 DIAGNOSIS — Z936 Other artificial openings of urinary tract status: Secondary | ICD-10-CM | POA: Diagnosis not present

## 2019-04-08 DIAGNOSIS — I10 Essential (primary) hypertension: Secondary | ICD-10-CM | POA: Diagnosis not present

## 2019-04-08 DIAGNOSIS — E785 Hyperlipidemia, unspecified: Secondary | ICD-10-CM | POA: Diagnosis not present

## 2019-04-10 DIAGNOSIS — D090 Carcinoma in situ of bladder: Secondary | ICD-10-CM | POA: Diagnosis not present

## 2019-04-10 DIAGNOSIS — I10 Essential (primary) hypertension: Secondary | ICD-10-CM | POA: Diagnosis not present

## 2019-04-10 DIAGNOSIS — N3000 Acute cystitis without hematuria: Secondary | ICD-10-CM | POA: Diagnosis not present

## 2019-04-10 DIAGNOSIS — E038 Other specified hypothyroidism: Secondary | ICD-10-CM | POA: Diagnosis not present

## 2019-04-29 DIAGNOSIS — D090 Carcinoma in situ of bladder: Secondary | ICD-10-CM | POA: Diagnosis not present

## 2019-05-04 DIAGNOSIS — D090 Carcinoma in situ of bladder: Secondary | ICD-10-CM | POA: Diagnosis not present

## 2019-05-04 DIAGNOSIS — J439 Emphysema, unspecified: Secondary | ICD-10-CM | POA: Diagnosis not present

## 2019-05-04 DIAGNOSIS — K7689 Other specified diseases of liver: Secondary | ICD-10-CM | POA: Diagnosis not present

## 2019-05-11 DIAGNOSIS — D225 Melanocytic nevi of trunk: Secondary | ICD-10-CM | POA: Diagnosis not present

## 2019-05-11 DIAGNOSIS — D1801 Hemangioma of skin and subcutaneous tissue: Secondary | ICD-10-CM | POA: Diagnosis not present

## 2019-05-11 DIAGNOSIS — Z85828 Personal history of other malignant neoplasm of skin: Secondary | ICD-10-CM | POA: Diagnosis not present

## 2019-05-11 DIAGNOSIS — L821 Other seborrheic keratosis: Secondary | ICD-10-CM | POA: Diagnosis not present

## 2019-07-06 DIAGNOSIS — Z125 Encounter for screening for malignant neoplasm of prostate: Secondary | ICD-10-CM | POA: Diagnosis not present

## 2019-07-06 DIAGNOSIS — E7849 Other hyperlipidemia: Secondary | ICD-10-CM | POA: Diagnosis not present

## 2019-07-06 DIAGNOSIS — E038 Other specified hypothyroidism: Secondary | ICD-10-CM | POA: Diagnosis not present

## 2019-07-13 DIAGNOSIS — I10 Essential (primary) hypertension: Secondary | ICD-10-CM | POA: Diagnosis not present

## 2019-07-13 DIAGNOSIS — M7989 Other specified soft tissue disorders: Secondary | ICD-10-CM | POA: Diagnosis not present

## 2019-07-13 DIAGNOSIS — C679 Malignant neoplasm of bladder, unspecified: Secondary | ICD-10-CM | POA: Diagnosis not present

## 2019-07-13 DIAGNOSIS — M48061 Spinal stenosis, lumbar region without neurogenic claudication: Secondary | ICD-10-CM | POA: Diagnosis not present

## 2019-07-13 DIAGNOSIS — Z0000 Encounter for general adult medical examination without abnormal findings: Secondary | ICD-10-CM | POA: Diagnosis not present

## 2019-07-13 DIAGNOSIS — I872 Venous insufficiency (chronic) (peripheral): Secondary | ICD-10-CM | POA: Diagnosis not present

## 2019-07-13 DIAGNOSIS — Z23 Encounter for immunization: Secondary | ICD-10-CM | POA: Diagnosis not present

## 2019-07-13 DIAGNOSIS — K219 Gastro-esophageal reflux disease without esophagitis: Secondary | ICD-10-CM | POA: Diagnosis not present

## 2019-07-13 DIAGNOSIS — M545 Low back pain: Secondary | ICD-10-CM | POA: Diagnosis not present

## 2019-07-13 DIAGNOSIS — I700 Atherosclerosis of aorta: Secondary | ICD-10-CM | POA: Diagnosis not present

## 2019-07-13 DIAGNOSIS — C61 Malignant neoplasm of prostate: Secondary | ICD-10-CM | POA: Diagnosis not present

## 2019-07-13 DIAGNOSIS — E039 Hypothyroidism, unspecified: Secondary | ICD-10-CM | POA: Diagnosis not present

## 2019-07-15 DIAGNOSIS — Z1212 Encounter for screening for malignant neoplasm of rectum: Secondary | ICD-10-CM | POA: Diagnosis not present

## 2019-07-22 DIAGNOSIS — H26493 Other secondary cataract, bilateral: Secondary | ICD-10-CM | POA: Diagnosis not present

## 2019-07-22 DIAGNOSIS — H43813 Vitreous degeneration, bilateral: Secondary | ICD-10-CM | POA: Diagnosis not present

## 2019-07-22 DIAGNOSIS — H5310 Unspecified subjective visual disturbances: Secondary | ICD-10-CM | POA: Diagnosis not present

## 2019-07-30 DIAGNOSIS — H26491 Other secondary cataract, right eye: Secondary | ICD-10-CM | POA: Diagnosis not present

## 2019-10-26 DIAGNOSIS — Z1331 Encounter for screening for depression: Secondary | ICD-10-CM | POA: Diagnosis not present

## 2019-10-26 DIAGNOSIS — M48061 Spinal stenosis, lumbar region without neurogenic claudication: Secondary | ICD-10-CM | POA: Diagnosis not present

## 2019-10-26 DIAGNOSIS — I872 Venous insufficiency (chronic) (peripheral): Secondary | ICD-10-CM | POA: Diagnosis not present

## 2019-10-26 DIAGNOSIS — I700 Atherosclerosis of aorta: Secondary | ICD-10-CM | POA: Diagnosis not present

## 2019-10-26 DIAGNOSIS — I10 Essential (primary) hypertension: Secondary | ICD-10-CM | POA: Diagnosis not present

## 2019-10-26 DIAGNOSIS — E039 Hypothyroidism, unspecified: Secondary | ICD-10-CM | POA: Diagnosis not present

## 2019-10-27 ENCOUNTER — Other Ambulatory Visit: Payer: Self-pay

## 2019-10-27 ENCOUNTER — Ambulatory Visit (HOSPITAL_COMMUNITY)
Admission: RE | Admit: 2019-10-27 | Discharge: 2019-10-27 | Disposition: A | Discharge status: Home / Self Care | Payer: Medicare Other | Source: Ambulatory Visit | Attending: Vascular Surgery | Admitting: Vascular Surgery

## 2019-10-27 ENCOUNTER — Other Ambulatory Visit (HOSPITAL_COMMUNITY): Payer: Self-pay | Admitting: Internal Medicine

## 2019-10-27 DIAGNOSIS — M79605 Pain in left leg: Secondary | ICD-10-CM

## 2019-10-27 DIAGNOSIS — Z0000 Encounter for general adult medical examination without abnormal findings: Secondary | ICD-10-CM | POA: Diagnosis not present

## 2019-10-27 DIAGNOSIS — M48061 Spinal stenosis, lumbar region without neurogenic claudication: Secondary | ICD-10-CM | POA: Diagnosis not present

## 2019-10-28 ENCOUNTER — Telehealth (HOSPITAL_COMMUNITY): Payer: Self-pay

## 2019-10-28 NOTE — Telephone Encounter (Signed)

## 2019-10-29 ENCOUNTER — Other Ambulatory Visit (HOSPITAL_COMMUNITY): Payer: Self-pay | Admitting: Internal Medicine

## 2019-10-29 ENCOUNTER — Ambulatory Visit (HOSPITAL_COMMUNITY)
Admission: RE | Admit: 2019-10-29 | Discharge: 2019-10-29 | Disposition: A | Discharge status: Home / Self Care | Payer: Medicare Other | Source: Ambulatory Visit | Attending: Vascular Surgery | Admitting: Vascular Surgery

## 2019-10-29 ENCOUNTER — Other Ambulatory Visit: Payer: Self-pay

## 2019-10-29 DIAGNOSIS — I739 Peripheral vascular disease, unspecified: Secondary | ICD-10-CM | POA: Diagnosis not present

## 2019-11-05 ENCOUNTER — Ambulatory Visit: Payer: Medicare Other | Attending: Internal Medicine

## 2019-11-05 DIAGNOSIS — Z23 Encounter for immunization: Secondary | ICD-10-CM

## 2019-11-05 DIAGNOSIS — C61 Malignant neoplasm of prostate: Secondary | ICD-10-CM | POA: Diagnosis not present

## 2019-11-05 DIAGNOSIS — D090 Carcinoma in situ of bladder: Secondary | ICD-10-CM | POA: Diagnosis not present

## 2019-11-05 NOTE — Progress Notes (Signed)
   Covid-19 Vaccination Clinic  Name:  Ronald Lowery    MRN: [id number] DOB: May 22, 1941  11/05/2019  Mr. Bertagnolli was observed post Covid-19 immunization for 15 minutes without incidence. He was provided with Vaccine Information Sheet and instruction to access the V-Safe system.   Mr. Bombara was instructed to call 911 with any severe reactions post vaccine: Marland Kitchen Difficulty breathing  . Swelling of your face and throat  . A fast heartbeat  . A bad rash all over your body  . Dizziness and weakness    Immunizations Administered    Name Date Dose VIS Date Route   Pfizer COVID-19 Vaccine 11/05/2019  9:11 AM 0.3 mL 09/25/2019 Intramuscular   Manufacturer: [place]   Lot: [id number]   [place]: [id number]

## 2019-11-09 DIAGNOSIS — C61 Malignant neoplasm of prostate: Secondary | ICD-10-CM | POA: Diagnosis not present

## 2019-11-09 DIAGNOSIS — D090 Carcinoma in situ of bladder: Secondary | ICD-10-CM | POA: Diagnosis not present

## 2019-11-09 DIAGNOSIS — C679 Malignant neoplasm of bladder, unspecified: Secondary | ICD-10-CM | POA: Diagnosis not present

## 2019-11-13 DIAGNOSIS — Z8551 Personal history of malignant neoplasm of bladder: Secondary | ICD-10-CM | POA: Diagnosis not present

## 2019-11-13 DIAGNOSIS — Z8546 Personal history of malignant neoplasm of prostate: Secondary | ICD-10-CM | POA: Diagnosis not present

## 2019-11-20 DIAGNOSIS — E785 Hyperlipidemia, unspecified: Secondary | ICD-10-CM | POA: Diagnosis not present

## 2019-11-20 DIAGNOSIS — E039 Hypothyroidism, unspecified: Secondary | ICD-10-CM | POA: Diagnosis not present

## 2019-11-20 DIAGNOSIS — G3184 Mild cognitive impairment, so stated: Secondary | ICD-10-CM | POA: Diagnosis not present

## 2019-11-24 ENCOUNTER — Ambulatory Visit: Payer: Medicare Other | Attending: Internal Medicine

## 2019-11-24 DIAGNOSIS — Z23 Encounter for immunization: Secondary | ICD-10-CM | POA: Insufficient documentation

## 2019-11-24 NOTE — Progress Notes (Signed)
   Covid-19 Vaccination Clinic  Name:  CASHUS WAGY    MRN: [id number] DOB: 1941/03/02  11/24/2019  Mr. Starn was observed post Covid-19 immunization for 15 minutes without incidence. He was provided with Vaccine Information Sheet and instruction to access the V-Safe system.   Mr. Talsma was instructed to call 911 with any severe reactions post vaccine: Marland Kitchen Difficulty breathing  . Swelling of your face and throat  . A fast heartbeat  . A bad rash all over your body  . Dizziness and weakness    Immunizations Administered    Name Date Dose VIS Date Route   Pfizer COVID-19 Vaccine 11/24/2019  9:05 AM 0.3 mL 09/25/2019 Intramuscular   Manufacturer: [place]   Lot: [id number]   [place]: [id number]

## 2019-12-08 ENCOUNTER — Other Ambulatory Visit: Payer: Self-pay | Admitting: Internal Medicine

## 2019-12-08 DIAGNOSIS — G3184 Mild cognitive impairment, so stated: Secondary | ICD-10-CM

## 2019-12-21 DIAGNOSIS — I739 Peripheral vascular disease, unspecified: Secondary | ICD-10-CM | POA: Diagnosis not present

## 2019-12-21 DIAGNOSIS — E038 Other specified hypothyroidism: Secondary | ICD-10-CM | POA: Diagnosis not present

## 2019-12-21 DIAGNOSIS — Z79899 Other long term (current) drug therapy: Secondary | ICD-10-CM | POA: Diagnosis not present

## 2019-12-31 ENCOUNTER — Ambulatory Visit
Admission: RE | Admit: 2019-12-31 | Discharge: 2019-12-31 | Disposition: A | Discharge status: Home / Self Care | Payer: Medicare Other | Source: Ambulatory Visit | Attending: Internal Medicine | Admitting: Internal Medicine

## 2019-12-31 DIAGNOSIS — G3184 Mild cognitive impairment, so stated: Secondary | ICD-10-CM | POA: Diagnosis not present

## 2019-12-31 MED ORDER — GADOBENATE DIMEGLUMINE 529 MG/ML IV SOLN
18.0000 mL | Freq: Once | INTRAVENOUS | Status: AC | PRN
  Administered 2019-12-31: 18 mL via INTRAVENOUS

## 2020-01-20 DIAGNOSIS — F329 Major depressive disorder, single episode, unspecified: Secondary | ICD-10-CM | POA: Diagnosis not present

## 2020-01-20 DIAGNOSIS — I872 Venous insufficiency (chronic) (peripheral): Secondary | ICD-10-CM | POA: Diagnosis not present

## 2020-01-20 DIAGNOSIS — M48061 Spinal stenosis, lumbar region without neurogenic claudication: Secondary | ICD-10-CM | POA: Diagnosis not present

## 2020-01-20 DIAGNOSIS — Z936 Other artificial openings of urinary tract status: Secondary | ICD-10-CM | POA: Diagnosis not present

## 2020-01-20 DIAGNOSIS — I10 Essential (primary) hypertension: Secondary | ICD-10-CM | POA: Diagnosis not present

## 2020-01-20 DIAGNOSIS — G3184 Mild cognitive impairment, so stated: Secondary | ICD-10-CM | POA: Diagnosis not present

## 2020-02-03 DIAGNOSIS — R509 Fever, unspecified: Secondary | ICD-10-CM | POA: Diagnosis not present

## 2020-02-03 DIAGNOSIS — N3000 Acute cystitis without hematuria: Secondary | ICD-10-CM | POA: Diagnosis not present

## 2020-02-03 DIAGNOSIS — Z8551 Personal history of malignant neoplasm of bladder: Secondary | ICD-10-CM | POA: Diagnosis not present

## 2020-02-03 DIAGNOSIS — Z8546 Personal history of malignant neoplasm of prostate: Secondary | ICD-10-CM | POA: Diagnosis not present

## 2020-02-03 DIAGNOSIS — R300 Dysuria: Secondary | ICD-10-CM | POA: Diagnosis not present

## 2020-02-18 DIAGNOSIS — Z8546 Personal history of malignant neoplasm of prostate: Secondary | ICD-10-CM | POA: Diagnosis not present

## 2020-02-18 DIAGNOSIS — Z8551 Personal history of malignant neoplasm of bladder: Secondary | ICD-10-CM | POA: Diagnosis not present

## 2020-02-18 DIAGNOSIS — N3000 Acute cystitis without hematuria: Secondary | ICD-10-CM | POA: Diagnosis not present

## 2020-03-10 ENCOUNTER — Emergency Department (HOSPITAL_COMMUNITY)
Admission: EM | Admit: 2020-03-10 | Discharge: 2020-03-10 | Disposition: A | Discharge status: Home / Self Care | Payer: Medicare Other | Attending: Emergency Medicine | Admitting: Emergency Medicine

## 2020-03-10 ENCOUNTER — Other Ambulatory Visit: Payer: Self-pay

## 2020-03-10 ENCOUNTER — Encounter (HOSPITAL_COMMUNITY): Payer: Self-pay | Admitting: Emergency Medicine

## 2020-03-10 ENCOUNTER — Emergency Department (HOSPITAL_COMMUNITY): Payer: Medicare Other

## 2020-03-10 DIAGNOSIS — Z79899 Other long term (current) drug therapy: Secondary | ICD-10-CM | POA: Diagnosis not present

## 2020-03-10 DIAGNOSIS — R531 Weakness: Secondary | ICD-10-CM | POA: Diagnosis not present

## 2020-03-10 DIAGNOSIS — E039 Hypothyroidism, unspecified: Secondary | ICD-10-CM | POA: Diagnosis not present

## 2020-03-10 DIAGNOSIS — Z8551 Personal history of malignant neoplasm of bladder: Secondary | ICD-10-CM | POA: Diagnosis not present

## 2020-03-10 DIAGNOSIS — R0602 Shortness of breath: Secondary | ICD-10-CM | POA: Insufficient documentation

## 2020-03-10 DIAGNOSIS — I499 Cardiac arrhythmia, unspecified: Secondary | ICD-10-CM | POA: Diagnosis not present

## 2020-03-10 DIAGNOSIS — R5381 Other malaise: Secondary | ICD-10-CM | POA: Diagnosis not present

## 2020-03-10 DIAGNOSIS — I491 Atrial premature depolarization: Secondary | ICD-10-CM | POA: Insufficient documentation

## 2020-03-10 DIAGNOSIS — I10 Essential (primary) hypertension: Secondary | ICD-10-CM | POA: Insufficient documentation

## 2020-03-10 DIAGNOSIS — N3000 Acute cystitis without hematuria: Secondary | ICD-10-CM | POA: Diagnosis not present

## 2020-03-10 LAB — BASIC METABOLIC PANEL
Anion gap: 10 (ref 5–15)
BUN: 11 mg/dL (ref 8–23)
CO2: 27 mmol/L (ref 22–32)
Calcium: 8.9 mg/dL (ref 8.9–10.3)
Chloride: 97 mmol/L — ABNORMAL LOW (ref 98–111)
Creatinine, Ser: 1.02 mg/dL (ref 0.61–1.24)
GFR calc Af Amer: >60 mL/min (ref >60)
GFR calc non Af Amer: >60 mL/min (ref >60)
Glucose, Bld: 103 mg/dL — ABNORMAL HIGH (ref 70–99)
Potassium: 4.6 mmol/L (ref 3.5–5.1)
Sodium: 134 mmol/L — ABNORMAL LOW (ref 135–145)

## 2020-03-10 LAB — CBC
HCT: 39.9 % (ref 39.0–52.0)
Hemoglobin: 13.2 g/dL (ref 13.0–17.0)
MCH: 30.8 pg (ref 26.0–34.0)
MCHC: 33.1 g/dL (ref 30.0–36.0)
MCV: 93.2 fL (ref 80.0–100.0)
Platelets: 232 10*3/uL (ref 150–400)
RBC: 4.28 MIL/uL (ref 4.22–5.81)
RDW: 12.7 % (ref 11.5–15.5)
WBC: 7.8 10*3/uL (ref 4.0–10.5)
nRBC: 0 % (ref 0.0–0.2)

## 2020-03-10 LAB — URINALYSIS, ROUTINE W REFLEX MICROSCOPIC
Bilirubin Urine: NEGATIVE
Glucose, UA: NEGATIVE mg/dL
Ketones, ur: 5 mg/dL — AB
Nitrite: NEGATIVE
Protein, ur: NEGATIVE mg/dL
Specific Gravity, Urine: 1.008 (ref 1.005–1.030)
pH: 6 (ref 5.0–8.0)

## 2020-03-10 LAB — HEPATIC FUNCTION PANEL
ALT: 13 U/L (ref 0–44)
AST: 17 U/L (ref 15–41)
Albumin: 3.9 g/dL (ref 3.5–5.0)
Alkaline Phosphatase: 50 U/L (ref 38–126)
Bilirubin, Direct: 0.1 mg/dL (ref 0.0–0.2)
Indirect Bilirubin: 0.6 mg/dL (ref 0.3–0.9)
Total Bilirubin: 0.7 mg/dL (ref 0.3–1.2)
Total Protein: 6.6 g/dL (ref 6.5–8.1)

## 2020-03-10 LAB — CBG MONITORING, ED: Glucose-Capillary: 76 mg/dL (ref 70–99)

## 2020-03-10 LAB — BRAIN NATRIURETIC PEPTIDE: B Natriuretic Peptide: 90.1 pg/mL (ref 0.0–100.0)

## 2020-03-10 NOTE — ED Notes (Signed)
Daughter in law at bedside

## 2020-03-10 NOTE — ED Provider Notes (Signed)
[facility] DEPT Provider Note   CSN: [id number] Arrival date & time: 03/10/20  1737     History Chief Complaint  Patient presents with  . Weakness    Ronald Lowery is a 79 y.o. male.  HPI     Has had periods of weakness, not all the time 2 days ago was out walking, try to walk every morning and about 1030AM was walking in the neighborhood no more than usual and felt like couldn't pick up one foot in front of the other, severe fatigue, difficulty getting home, like couldn't pick up feet, didn't fall, don't remember feeling lightheaded, Doesn' think he was having palpitations, no chest pain, thinks he might have been a little short of breath. Very unusual. No focal numbness or weakness. No pain.   Slowing mentation over the last several weeks  Difficult time waking up this morning, very sleepy, which was reason for wife calling Dr. Al[facility]'s office.   Fatigue for 2 days.  Hx of UTI with urostomy, urine looked clear in office    No abdominal pain, headache, cough, vomiting, nausea, diarrhea, black or bloody stools Some constipation  Chronic back pain scoliosis, wears lap belt  Had both covid 19 vax, no medication changes Past Medical History:  Diagnosis Date  . Anxiety   . Arthritis   . Basal cell carcinoma 2004   x1-face  . Cataract    bilateral-may be removed 05/2015  . Complication of anesthesia    "took a while to wake up with triple hernia repair"  . DDD (degenerative disc disease), lumbar    L5-steroid injection 01/2015  . Depression   . Environmental allergies   . GERD (gastroesophageal reflux disease)   . Hematuria   . History of bladder cancer   . History of hiatal hernia   . History of prostate cancer   . History of skin cancer   . Hyperlipidemia   . Hypertension   . Hypothyroidism   . Nocturia   . Numbness    4TH / 5TH FINGER TIPS  . Prostate cancer ([place])   . Scoliosis   . Squamous carcinoma 2008   x2-  neck, face  . Urgency of urination   . Varicose veins     Patient Active Problem List   Diagnosis Date Noted  . Fever and chills 07/07/2016  . Bladder cancer ([place]) 06/28/2016  . S/P left THA, AA 08/16/2015    Past Surgical History:  Procedure Laterality Date  . APPENDECTOMY  1955  . CATARACT EXTRACTION    . CYSTOSCOPY N/A 12/19/2015   Procedure: CYSTOSCOPY;  Surgeon: Raynelle Bring, MD;  Location: [facility];  Service: Urology;  Laterality: N/A;  . CYSTOSCOPY W/ RETROGRADES Bilateral 05/09/2015   Procedure: CYSTOSCOPY WITH LEFT URETERAL CANNULATION;  Surgeon: Raynelle Bring, MD;  Location: [facility];  Service: Urology;  Laterality: Bilateral;  . CYSTOSCOPY W/ RETROGRADES Bilateral 05/04/2016   Procedure: CYSTOSCOPY WITH RETROGRADE PYELOGRAM;  Surgeon: Raynelle Bring, MD;  Location: [facility];  Service: Urology;  Laterality: Bilateral;  . ESOPHAGOGASTRODUODENOSCOPY (EGD) WITH ESOPHAGEAL DILATION  2004  . EYE SURGERY     cataracts with lens implants bilateral  . FOOT SURGERY  1980's   Morton's neuroma x3  . HERNIA REPAIR     triple laproscopic  . LYMPHADENECTOMY Bilateral 06/28/2016   Procedure: PELVIC LYMPHADENECTOMY;  Surgeon: Raynelle Bring, MD;  Location: [facility];  Service: Urology;  Laterality: Bilateral;  . MASS BIOPSY     bladder  tumor biopsy  . PROSTATECTOMY  2010  . ROBOT ASSISTED LAPAROSCOPIC COMPLETE CYSTECT ILEAL CONDUIT N/A 06/28/2016   Procedure: XI ROBOTIC ASSISTED LAPAROSCOPIC COMPLETE CYSTECT ILEAL CONDUIT;  Surgeon: Raynelle Bring, MD;  Location: [facility];  Service: Urology;  Laterality: N/A;  . SKIN CANCER DESTRUCTION  2004   basal cell carcinoma  . TONSILLECTOMY  ~1949  . TOTAL HIP ARTHROPLASTY Left 08/16/2015   Procedure: LEFT TOTAL HIP ARTHROPLASTY ANTERIOR APPROACH;  Surgeon: Paralee Cancel, MD;  Location: [facility];  Service: Orthopedics;  Laterality: Left;  . TRANSURETHRAL RESECTION OF BLADDER TUMOR N/A 05/09/2015   Procedure: TRANSURETHRAL RESECTION OF BLADDER TUMOR (TURBT);   Surgeon: Raynelle Bring, MD;  Location: [facility];  Service: Urology;  Laterality: N/A;  . TRANSURETHRAL RESECTION OF BLADDER TUMOR N/A 11/14/2015   Procedure: TRANSURETHRAL RESECTION OF BLADDER TUMOR (TURBT);  Surgeon: Raynelle Bring, MD;  Location: [facility];  Service: Urology;  Laterality: N/A;  . TRANSURETHRAL RESECTION OF BLADDER TUMOR N/A 12/19/2015   Procedure: TRANSURETHRAL RESECTION OF BLADDER TUMOR (TURBT);  Surgeon: Raynelle Bring, MD;  Location: [facility];  Service: Urology;  Laterality: N/A;  . TRANSURETHRAL RESECTION OF BLADDER TUMOR N/A 05/04/2016   Procedure: TRANSURETHRAL RESECTION OF BLADDER TUMOR (TURBT);  Surgeon: Raynelle Bring, MD;  Location: [facility];  Service: Urology;  Laterality: N/A;  . VEIN SURGERY Left 2011   leg       Family History  Problem Relation Age of Onset  . Colon cancer Maternal Aunt   . Esophageal cancer Neg Hx   . Stomach cancer Neg Hx   . Rectal cancer Neg Hx     Social History   Tobacco Use  . Smoking status: Never Smoker  . Smokeless tobacco: Never Used  Substance Use Topics  . Alcohol use: No  . Drug use: No    Home Medications Prior to Admission medications   Medication Sig Start Date End Date Taking? Authorizing Provider  cholecalciferol (VITAMIN D3) 25 MCG (1000 UNIT) tablet Take 1,000 Units by mouth daily.   Yes [provider]  gabapentin (NEURONTIN) 100 MG capsule Take 100 mg by mouth at bedtime.    Yes [provider]  levothyroxine (SYNTHROID) 100 MCG tablet Take 100 mcg by mouth daily before breakfast.  01/21/20  Yes [provider]  loratadine (CLARITIN) 10 MG tablet Take 10 mg by mouth daily as needed for allergies.    Yes [provider]  omeprazole (PRILOSEC) 20 MG capsule Take 20 mg by mouth every evening. 1 hour before breakfast.   Yes [provider]  Probiotic Product (PROBIOTIC DAILY PO) Take 1 capsule by mouth every morning. [facility].   Yes [provider]    pseudoephedrine (SUDAFED) 120 MG 12 hr tablet Take 120 mg by mouth daily as needed for congestion.    Yes [provider]  psyllium (METAMUCIL) 58.6 % packet Take 1 packet by mouth daily.   Yes [provider]  zolpidem (AMBIEN) 10 MG tablet Take 5 mg by mouth at bedtime.    Yes [provider]  enoxaparin (LOVENOX) 40 MG/0.4ML injection Inject 0.4 mLs (40 mg total) into the skin daily. Patient not taking: Reported on 03/10/2020 07/05/16   Raynelle Bring, MD  psyllium (REGULOID) 0.52 g capsule Take 0.52 g by mouth daily.     [provider]  sulfamethoxazole-trimethoprim (BACTRIM DS,SEPTRA DS) 800-160 MG tablet Take 1 tablet by mouth 2 (two) times daily. Patient not taking: Reported on 03/10/2020 07/11/16   Alinda Money,  Wynetta Emery, MD  traMADol (ULTRAM) 50 MG tablet Take 1-2 tablets (50-100 mg total) by mouth every 6 (six) hours as needed for moderate pain. Patient not taking: Reported on 03/10/2020 07/04/16   Raynelle Bring, MD    Allergies    Cefdinir and Myrbetriq [mirabegron]  Review of Systems   Review of Systems  Constitutional: Positive for fatigue. Negative for fever.  HENT: Negative for sore throat.   Eyes: Negative for visual disturbance.  Respiratory: Negative for cough and shortness of breath.   Cardiovascular: Negative for chest pain, palpitations and leg swelling.  Gastrointestinal: Positive for constipation. Negative for abdominal pain, diarrhea, nausea and vomiting.  Genitourinary: Negative for difficulty urinating.  Musculoskeletal: Positive for back pain. Negative for neck stiffness.  Skin: Negative for rash.  Neurological: Positive for weakness (generalized). Negative for syncope, facial asymmetry, light-headedness, numbness and headaches.    Physical Exam Updated Vital Signs BP (!) 131/91   Pulse 83   Temp 98.6 F (37 C) (Oral)   Resp 16   SpO2 96%   Physical Exam Vitals and nursing note reviewed.  Constitutional:      General: He is  not in acute distress.    Appearance: Normal appearance. He is well-developed. He is not ill-appearing or diaphoretic.  HENT:     Head: Normocephalic and atraumatic.  Eyes:     General: No visual field deficit.    Extraocular Movements: Extraocular movements intact.     Conjunctiva/sclera: Conjunctivae normal.     Pupils: Pupils are equal, round, and reactive to light.  Cardiovascular:     Rate and Rhythm: Normal rate and regular rhythm.     Pulses: Normal pulses.     Heart sounds: Normal heart sounds. No murmur. No friction rub. No gallop.   Pulmonary:     Effort: Pulmonary effort is normal. No respiratory distress.     Breath sounds: Normal breath sounds. No wheezing or rales.  Abdominal:     General: There is no distension.     Palpations: Abdomen is soft.     Tenderness: There is no abdominal tenderness. There is no guarding.  Musculoskeletal:        General: No swelling or tenderness.     Cervical back: Normal range of motion.  Skin:    General: Skin is warm and dry.     Findings: No erythema or rash.  Neurological:     General: No focal deficit present.     Mental Status: He is alert and oriented to person, place, and time.     GCS: GCS eye subscore is 4. GCS verbal subscore is 5. GCS motor subscore is 6.     Cranial Nerves: No cranial nerve deficit, dysarthria or facial asymmetry.     Sensory: No sensory deficit.     Motor: No weakness or tremor.     Coordination: Coordination normal. Finger-Nose-Finger Test normal.     Gait: Gait normal.     ED Results / Procedures / Treatments   Labs (all labs ordered are listed, but only abnormal results are displayed) Labs Reviewed  BASIC METABOLIC PANEL - Abnormal; Notable for the following components:      Result Value   Sodium 134 (*)    Chloride 97 (*)    Glucose, Bld 103 (*)    All other components within normal limits  URINALYSIS, ROUTINE W REFLEX MICROSCOPIC - Abnormal; Notable for the following components:   Hgb  urine dipstick SMALL (*)    Ketones, ur  5 (*)    Leukocytes,Ua SMALL (*)    Bacteria, UA FEW (*)    All other components within normal limits  CBC  BRAIN NATRIURETIC PEPTIDE  HEPATIC FUNCTION PANEL  CBG MONITORING, ED    EKG None  Radiology DG Chest Portable 1 View  Result Date: 03/10/2020 CLINICAL DATA:  Arrhythmia, weakness EXAM: PORTABLE CHEST 1 VIEW COMPARISON:  07/07/2016 FINDINGS: Two frontal views of the chest demonstrate an unremarkable cardiac silhouette. No airspace disease, effusion, or pneumothorax. No acute bony abnormalities. IMPRESSION: 1. No acute intrathoracic process. Electronically Signed   By: Randa Ngo M.D.   On: 03/10/2020 20:11    Procedures Procedures (including critical care time)  Medications Ordered in ED Medications - No data to display  ED Course  I have reviewed the triage vital signs and the nursing notes.  Pertinent labs & imaging results that were available during my care of the patient were reviewed by me and considered in my medical decision making (see chart for details).    MDM Rules/Calculators/A&P                      79 year old male with a history of hypertension, hyperlipidemia, history of bladder cancer of disc disease, presents with concern for generalized weakness.  No focal findings on neurologic exam, doubt ICH, CVA.  Labs without significant electrolyte abnormalities and no anemia. UA without infection.  NO persistent dyspnea, no chest pain and doubt PE/ACS.  Discussed EKG with Cardiologist on call. Unclear rhythm, could admit for tele monitoring or have outpatient monitoring.  Telemetry does at times appear consistent with atrial flutter with variable block, and given history of generalized weakness with exertion, suspect he may be having tachycardia with atrial arrhythmia as etiology of symptoms.  Discussed option of admission for monitoring versus outpatient close follow up with pt and daughter-in-law.  He was able to ambulate  in the ED without hypoxia, felt well, no tachypnea although did become transiently tachycardic with quick improvement with rest.  Patient discharged in stable condition with understanding of reasons to return.    Final Clinical Impression(s) / ED Diagnoses Final diagnoses:  Generalized weakness  PAC (premature atrial contraction)    Rx / DC Orders ED Discharge Orders    None       Gareth Morgan, MD 03/11/20 [phone]

## 2020-03-10 NOTE — ED Notes (Signed)
PT a/o and ambulatory at discharge. Discharge instructions provided to pt and daughter in law.  PT instructed to follow up with primary care doctor and cardiologist.

## 2020-03-10 NOTE — ED Triage Notes (Signed)
Per patient, states he has been weak the past couple of days-states he went for a walk the other day and he was having trouble lifting his legs, legs felt heavy, states this has never happened before-was at urologist and they sent him here for abnormal EKG

## 2020-03-10 NOTE — ED Triage Notes (Signed)
Per EMS- Patient is coming from a Urology office. EMS called because he had an irregular heart rate and c/o weakness.

## 2020-03-16 ENCOUNTER — Other Ambulatory Visit: Payer: Self-pay

## 2020-03-16 ENCOUNTER — Ambulatory Visit (INDEPENDENT_AMBULATORY_CARE_PROVIDER_SITE_OTHER): Payer: Medicare Other | Admitting: Cardiology

## 2020-03-16 ENCOUNTER — Encounter: Payer: Self-pay | Admitting: Cardiology

## 2020-03-16 VITALS — BP 130/84 | HR 85 | Ht 75.5 in | Wt 179.2 lb

## 2020-03-16 DIAGNOSIS — R9431 Abnormal electrocardiogram [ECG] [EKG]: Secondary | ICD-10-CM

## 2020-03-16 DIAGNOSIS — Z7189 Other specified counseling: Secondary | ICD-10-CM

## 2020-03-16 DIAGNOSIS — R5383 Other fatigue: Secondary | ICD-10-CM

## 2020-03-16 NOTE — Progress Notes (Signed)
Cardiology Office Note:    Date:  03/16/2020   ID:  Ronald Lowery, Ronald Lowery 03/11/1941, MRN [id number]  PCP:  Ronald Bunting, MD  Cardiologist:  Ronald Dresser, MD  Referring MD: Ronald Bunting, MD   CC: new patient evaluation for abnormal ECG  History of Present Illness:    Ronald Lowery is a 79 y.o. male with a hx of hypertension, hyperlipidemia, hypothyroidism, bladder cancer who is seen as a new consult at the request of Ronald Bunting, MD for the evaluation and management of abnormal ECG.  Ronald Lowery was seen in the [facility] ER for fatigue. ECG done at that time read as undetermined rhythm, possible flutter vs. Sinus with PACs. I cannot see telemetry, but there is mention of it appearing consistent with flutter with variable conduction.  Today, he is feeling back to his baseline. Wife was worried that a UTI was causing his symptoms, came to Dr. Lynne Lowery office. Initially told no significant infection, but now there is infection growing, instructed to get antibiotics.  He has never noted that his heart rate is irregular, but at the visit with Dr. Lynne Logan PA, his heart rate was irregular per report. He was sent to ER for further evaluation. Also noted at the same time that he was stumbling, having more trouble lifting his feet.   Notes that he is chronically sleepy every afternoon, tires easily, has intermittent confusion and memory issues. Sleeps well at night, has ambien to help. Able to be active during the day, but falls asleep for 5-10 min at a time if he rests. Denies snoring, no history of sleep apnea. Also intermittently has a dry cough, improved with cough syrup.  Cardiovascular risk factors: Prior clinical ASCVD: none Comorbid conditions: hypothyroidism, hypertension, hyperlipidemia. Denies diabetes. Metabolic syndrome/Obesity: BMI 22 Chronic inflammatory conditions: none Tobacco use history: never Family history: not sure about his mother's heart health,  but none he is aware of, and she passed away in her 61s. Father died in his 36s of melanoma. No heart disease in brother or sister. Prior cardiac testing and/or incidental findings on other testing (ie coronary calcium): none  Past Medical History:  Diagnosis Date  . Anxiety   . Arthritis   . Basal cell carcinoma 2004   x1-face  . Cataract    bilateral-may be removed 05/2015  . Complication of anesthesia    "took a while to wake up with triple hernia repair"  . DDD (degenerative disc disease), lumbar    L5-steroid injection 01/2015  . Depression   . Environmental allergies   . GERD (gastroesophageal reflux disease)   . Hematuria   . History of bladder cancer   . History of hiatal hernia   . History of prostate cancer   . History of skin cancer   . Hyperlipidemia   . Hypertension   . Hypothyroidism   . Nocturia   . Numbness    4TH / 5TH FINGER TIPS  . Prostate cancer ([place])   . Scoliosis   . Squamous carcinoma 2008   x2- neck, face  . Urgency of urination   . Varicose veins     Past Surgical History:  Procedure Laterality Date  . APPENDECTOMY  1955  . CATARACT EXTRACTION    . CYSTOSCOPY N/A 12/19/2015   Procedure: CYSTOSCOPY;  Surgeon: Raynelle Bring, MD;  Location: [facility];  Service: Urology;  Laterality: N/A;  . CYSTOSCOPY W/ RETROGRADES Bilateral 05/09/2015   Procedure: CYSTOSCOPY WITH LEFT URETERAL CANNULATION;  Surgeon: Wynetta Emery  Alinda Money, MD;  Location: [facility];  Service: Urology;  Laterality: Bilateral;  . CYSTOSCOPY W/ RETROGRADES Bilateral 05/04/2016   Procedure: CYSTOSCOPY WITH RETROGRADE PYELOGRAM;  Surgeon: Raynelle Bring, MD;  Location: [facility];  Service: Urology;  Laterality: Bilateral;  . ESOPHAGOGASTRODUODENOSCOPY (EGD) WITH ESOPHAGEAL DILATION  2004  . EYE SURGERY     cataracts with lens implants bilateral  . FOOT SURGERY  1980's   Morton's neuroma x3  . HERNIA REPAIR     triple laproscopic  . LYMPHADENECTOMY Bilateral 06/28/2016   Procedure: PELVIC  LYMPHADENECTOMY;  Surgeon: Raynelle Bring, MD;  Location: [facility];  Service: Urology;  Laterality: Bilateral;  . MASS BIOPSY     bladder tumor biopsy  . PROSTATECTOMY  2010  . ROBOT ASSISTED LAPAROSCOPIC COMPLETE CYSTECT ILEAL CONDUIT N/A 06/28/2016   Procedure: XI ROBOTIC ASSISTED LAPAROSCOPIC COMPLETE CYSTECT ILEAL CONDUIT;  Surgeon: Raynelle Bring, MD;  Location: [facility];  Service: Urology;  Laterality: N/A;  . SKIN CANCER DESTRUCTION  2004   basal cell carcinoma  . TONSILLECTOMY  ~1949  . TOTAL HIP ARTHROPLASTY Left 08/16/2015   Procedure: LEFT TOTAL HIP ARTHROPLASTY ANTERIOR APPROACH;  Surgeon: Paralee Cancel, MD;  Location: [facility];  Service: Orthopedics;  Laterality: Left;  . TRANSURETHRAL RESECTION OF BLADDER TUMOR N/A 05/09/2015   Procedure: TRANSURETHRAL RESECTION OF BLADDER TUMOR (TURBT);  Surgeon: Raynelle Bring, MD;  Location: [facility];  Service: Urology;  Laterality: N/A;  . TRANSURETHRAL RESECTION OF BLADDER TUMOR N/A 11/14/2015   Procedure: TRANSURETHRAL RESECTION OF BLADDER TUMOR (TURBT);  Surgeon: Raynelle Bring, MD;  Location: [facility];  Service: Urology;  Laterality: N/A;  . TRANSURETHRAL RESECTION OF BLADDER TUMOR N/A 12/19/2015   Procedure: TRANSURETHRAL RESECTION OF BLADDER TUMOR (TURBT);  Surgeon: Raynelle Bring, MD;  Location: [facility];  Service: Urology;  Laterality: N/A;  . TRANSURETHRAL RESECTION OF BLADDER TUMOR N/A 05/04/2016   Procedure: TRANSURETHRAL RESECTION OF BLADDER TUMOR (TURBT);  Surgeon: Raynelle Bring, MD;  Location: [facility];  Service: Urology;  Laterality: N/A;  . VEIN SURGERY Left 2011   leg    Current Medications: Current Outpatient Medications on File Prior to Visit  Medication Sig  . doxycycline (VIBRA-TABS) 100 MG tablet Take 100 mg by mouth 2 (two) times daily.  Marland Kitchen gabapentin (NEURONTIN) 100 MG capsule Take 100 mg by mouth at bedtime.   Marland Kitchen levothyroxine (SYNTHROID) 100 MCG tablet Take 100 mcg by mouth daily before breakfast.   . loratadine (CLARITIN) 10 MG tablet  Take 10 mg by mouth daily as needed for allergies.   Marland Kitchen omeprazole (PRILOSEC) 20 MG capsule Take 20 mg by mouth every evening. 1 hour before breakfast.  . Probiotic Product (PROBIOTIC DAILY PO) Take 1 capsule by mouth every morning. [facility] colon health.  . psyllium (METAMUCIL) 58.6 % packet Take 1 packet by mouth daily.  . psyllium (REGULOID) 0.52 g capsule Take 0.52 g by mouth daily.   Marland Kitchen zolpidem (AMBIEN) 10 MG tablet Take 5 mg by mouth at bedtime.    No current facility-administered medications on file prior to visit.     Allergies:   Cefdinir and Myrbetriq [mirabegron]   Social History   Tobacco Use  . Smoking status: Never Smoker  . Smokeless tobacco: Never Used  Substance Use Topics  . Alcohol use: No  . Drug use: No    Family History: family history includes Colon cancer in his maternal aunt. There is no history of Esophageal cancer, Stomach cancer, or Rectal cancer.  ROS:   Please see the  history of present illness.  Additional pertinent ROS: Constitutional: Negative for chills, fever, night sweats, unintentional weight loss  HENT: Negative for ear pain and hearing loss.   Eyes: Negative for loss of vision and eye pain.  Respiratory: Negative for sputum, wheezing.   Cardiovascular: See HPI. Gastrointestinal: Negative for abdominal pain, melena, and hematochezia.  Genitourinary: Negative for hematuria. Has ostomy. Musculoskeletal: Negative for falls and myalgias.  Skin: Negative for itching and rash.  Neurological: Negative for focal weakness, focal sensory changes and loss of consciousness.  Endo/Heme/Allergies: Does not bruise/bleed easily.     EKGs/Labs/Other Studies Reviewed:    The following studies were reviewed today: No prior cardiac studies  EKG:  EKG is personally reviewed.  The ekg ordered today demonstrates sinus rhythm with 1st degree AV block. Rhythm strip run as well. Remains completely regular throughout. Questioned flutter based on leads III/aVL,  but remainder of leads support sinus.   Recent Labs: 03/10/2020: ALT 13; B Natriuretic Peptide 90.1; BUN 11; Creatinine, Ser 1.02; Hemoglobin 13.2; Platelets 232; Potassium 4.6; Sodium 134  Recent Lipid Panel No results found for: CHOL, TRIG, HDL, CHOLHDL, VLDL, LDLCALC, LDLDIRECT  Physical Exam:    VS:  BP 130/84   Pulse 85   Ht 6' 3.5" (1.918 m)   Wt 179 lb 3.2 oz (81.3 kg)   SpO2 95%   BMI 22.10 kg/m     Wt Readings from Last 3 Encounters:  03/16/20 179 lb 3.2 oz (81.3 kg)  07/07/16 180 lb 12.4 oz (82 kg)  06/28/16 198 lb 13.7 oz (90.2 kg)    GEN: Well nourished, well developed in no acute distress HEENT: Normal, moist mucous membranes NECK: No JVD CARDIAC: regular rhythm, normal S1 and S2, no rubs or gallops. No murmurs. Occasional S3/S4 appreciated. Rare premature beat VASCULAR: Radial and DP pulses 2+ bilaterally. No carotid bruits RESPIRATORY:  Clear to auscultation without rales, wheezing or rhonchi  ABDOMEN: Soft, non-tender, non-distended MUSCULOSKELETAL:  Ambulates independently SKIN: Warm and dry, no edema NEUROLOGIC:  Alert and oriented x 3. No focal neuro deficits noted. PSYCHIATRIC:  Normal affect    ASSESSMENT:    1. Fatigue, unspecified type   2. Nonspecific abnormal electrocardiogram (ECG) (EKG)   3. Cardiac risk counseling   4. Counseling on health promotion and disease prevention    PLAN:    Fatigue: unclear etiology. Falls asleep easily during the day, but no other suggestion of sleep apnea -with abnormal ecg, will get echo to exclude cardiac dysfunction or structural abnormality as cause  Abnormal ECG: ECG today is completely regular, no premature beats. The baseline in III/aVL does have wave to it, which is seen with flutter. On exam he has occasional S3/S4 which sound like irregular beats, but his pulse remains regular throughout.  -noted to have possible flutter on telemetry in ER. No strip available -discussed monitor at length today. As he is  asymptomatic, would be used primarily to determine if he has atrial fibrillation or flutter that will require anticoagulation -we discussed monitor at length, and he does not wish to proceed at this time -will call if he develops symptoms or has prolonged elevated heart rate, and we can send a Zio at that time.  Cardiac risk counseling and prevention recommendations: -recommend heart healthy/Mediterranean diet, with whole grains, fruits, vegetable, fish, lean meats, nuts, and olive oil. Limit salt. -recommend moderate walking, 3-5 times/week for 30-50 minutes each session. Aim for at least 150 minutes.week. Goal should be pace of 3 miles/hours, or  walking 1.5 miles in 30 minutes -recommend avoidance of tobacco products. Avoid excess alcohol.  Plan for follow up: based on results of echo. If normal, can see as needed  Ronald Dresser, MD, PhD [place]  [facility]    Medication Adjustments/Labs and Tests Ordered: Current medicines are reviewed at length with the patient today.  Concerns regarding medicines are outlined above.  Orders Placed This Encounter  Procedures  . EKG 12-Lead  . ECHOCARDIOGRAM COMPLETE   No orders of the defined types were placed in this encounter.   Patient Instructions  Medication Instructions:  Your Physician recommend you continue on your current medication as directed.    *If you need a refill on your cardiac medications before your next appointment, please call your pharmacy*   Lab Work: None   Testing/Procedures: Your physician has requested that you have an echocardiogram. Echocardiography is a painless test that uses sound waves to create images of your heart. It provides your doctor with information about the size and shape of your heart and how well your heart's chambers and valves are working. This procedure takes approximately one hour. There are no restrictions for this procedure. [place]  [address]    Follow-Up: At [facility], you and your health needs are our priority.  As part of our continuing mission to provide you with exceptional heart care, we have created designated Provider Care Teams.  These Care Teams include your primary Cardiologist (physician) and Advanced Practice Providers (APPs -  Physician Assistants and Nurse Practitioners) who all work together to provide you with the care you need, when you need it.  We recommend signing up for the patient portal called "MyChart".  Sign up information is provided on this After Visit Summary.  MyChart is used to connect with patients for Virtual Visits (Telemedicine).  Patients are able to view lab/test results, encounter notes, upcoming appointments, etc.  Non-urgent messages can be sent to your provider as well.   To learn more about what you can do with MyChart, go to [email or web address].    Your next appointment:   As needed  The format for your next appointment:   Either In Person or Virtual  Provider:   Buford Dresser, MD       Signed, Ronald Dresser, MD PhD 03/16/2020 1:52 PM    [place]

## 2020-03-16 NOTE — Patient Instructions (Signed)
Medication Instructions:  Your Physician recommend you continue on your current medication as directed.    *If you need a refill on your cardiac medications before your next appointment, please call your pharmacy*   Lab Work: None   Testing/Procedures: Your physician has requested that you have an echocardiogram. Echocardiography is a painless test that uses sound waves to create images of your heart. It provides your doctor with information about the size and shape of your heart and how well your heart's chambers and valves are working. This procedure takes approximately one hour. There are no restrictions for this procedure. [place] [address]    Follow-Up: At [facility], you and your health needs are our priority.  As part of our continuing mission to provide you with exceptional heart care, we have created designated Provider Care Teams.  These Care Teams include your primary Cardiologist (physician) and Advanced Practice Providers (APPs -  Physician Assistants and Nurse Practitioners) who all work together to provide you with the care you need, when you need it.  We recommend signing up for the patient portal called "MyChart".  Sign up information is provided on this After Visit Summary.  MyChart is used to connect with patients for Virtual Visits (Telemedicine).  Patients are able to view lab/test results, encounter notes, upcoming appointments, etc.  Non-urgent messages can be sent to your provider as well.   To learn more about what you can do with MyChart, go to [email or web address].    Your next appointment:   As needed  The format for your next appointment:   Either In Person or Virtual  Provider:   Buford Dresser, MD

## 2020-04-05 ENCOUNTER — Ambulatory Visit (HOSPITAL_COMMUNITY): Payer: Medicare Other | Attending: Cardiology

## 2020-04-05 ENCOUNTER — Other Ambulatory Visit: Payer: Self-pay

## 2020-04-05 DIAGNOSIS — R9431 Abnormal electrocardiogram [ECG] [EKG]: Secondary | ICD-10-CM

## 2020-04-05 DIAGNOSIS — R5383 Other fatigue: Secondary | ICD-10-CM | POA: Diagnosis not present

## 2020-04-07 ENCOUNTER — Telehealth: Payer: Self-pay | Admitting: Cardiology

## 2020-04-07 NOTE — Telephone Encounter (Signed)
New Message  Pts wife is calling and is asking they call with Echo results because they cannot get into mychart   Please call

## 2020-04-07 NOTE — Telephone Encounter (Signed)
Spoke to patient's wife advised echo results not available yet.Advised Dr.Christopher is out of office.I will send message to her RN.

## 2020-04-12 NOTE — Telephone Encounter (Signed)
Spoke to wife and advised I am unable to release any information due to not being listed on the chart. Wife will have pt call back to provide approval.

## 2020-04-12 NOTE — Telephone Encounter (Signed)
Follow Up:   Wife is calling to see if pt's Echo results are ready please?

## 2020-04-15 ENCOUNTER — Telehealth: Payer: Self-pay

## 2020-04-15 ENCOUNTER — Other Ambulatory Visit: Payer: Self-pay

## 2020-04-15 DIAGNOSIS — R002 Palpitations: Secondary | ICD-10-CM

## 2020-04-15 NOTE — Telephone Encounter (Signed)
7 day Zio XT registered to be mailed to pt's home address. Address verified and brief instructions were given.

## 2020-04-15 NOTE — Telephone Encounter (Signed)
Pt updated with test results and MD's recommendation. Orders placed for monitor and message sent to the monitoring department.

## 2020-04-21 ENCOUNTER — Other Ambulatory Visit (INDEPENDENT_AMBULATORY_CARE_PROVIDER_SITE_OTHER): Payer: Medicare Other

## 2020-04-21 DIAGNOSIS — R002 Palpitations: Secondary | ICD-10-CM

## 2020-05-03 DIAGNOSIS — L570 Actinic keratosis: Secondary | ICD-10-CM | POA: Diagnosis not present

## 2020-05-03 DIAGNOSIS — D1801 Hemangioma of skin and subcutaneous tissue: Secondary | ICD-10-CM | POA: Diagnosis not present

## 2020-05-03 DIAGNOSIS — D225 Melanocytic nevi of trunk: Secondary | ICD-10-CM | POA: Diagnosis not present

## 2020-05-03 DIAGNOSIS — Z85828 Personal history of other malignant neoplasm of skin: Secondary | ICD-10-CM | POA: Diagnosis not present

## 2020-05-03 DIAGNOSIS — L821 Other seborrheic keratosis: Secondary | ICD-10-CM | POA: Diagnosis not present

## 2020-05-13 DIAGNOSIS — R002 Palpitations: Secondary | ICD-10-CM | POA: Diagnosis not present

## 2020-05-15 DIAGNOSIS — R0789 Other chest pain: Secondary | ICD-10-CM | POA: Diagnosis not present

## 2020-05-15 DIAGNOSIS — I10 Essential (primary) hypertension: Secondary | ICD-10-CM | POA: Diagnosis not present

## 2020-05-15 DIAGNOSIS — R079 Chest pain, unspecified: Secondary | ICD-10-CM | POA: Diagnosis not present

## 2020-05-24 ENCOUNTER — Telehealth: Payer: Medicare Other | Admitting: Cardiology

## 2020-05-30 ENCOUNTER — Emergency Department (HOSPITAL_COMMUNITY): Payer: Medicare Other

## 2020-05-30 ENCOUNTER — Inpatient Hospital Stay (HOSPITAL_COMMUNITY)
Admission: EM | Admit: 2020-05-30 | Discharge: 2020-06-02 | DRG: 698 | Disposition: A | Discharge status: Home Health | Payer: Medicare Other | Attending: Internal Medicine | Admitting: Internal Medicine

## 2020-05-30 ENCOUNTER — Other Ambulatory Visit: Payer: Self-pay

## 2020-05-30 ENCOUNTER — Encounter (HOSPITAL_COMMUNITY): Payer: Self-pay

## 2020-05-30 DIAGNOSIS — M4186 Other forms of scoliosis, lumbar region: Secondary | ICD-10-CM | POA: Diagnosis not present

## 2020-05-30 DIAGNOSIS — Z79890 Hormone replacement therapy (postmenopausal): Secondary | ICD-10-CM

## 2020-05-30 DIAGNOSIS — A419 Sepsis, unspecified organism: Secondary | ICD-10-CM | POA: Diagnosis not present

## 2020-05-30 DIAGNOSIS — I4891 Unspecified atrial fibrillation: Secondary | ICD-10-CM

## 2020-05-30 DIAGNOSIS — Z79899 Other long term (current) drug therapy: Secondary | ICD-10-CM

## 2020-05-30 DIAGNOSIS — I4892 Unspecified atrial flutter: Secondary | ICD-10-CM | POA: Diagnosis not present

## 2020-05-30 DIAGNOSIS — I480 Paroxysmal atrial fibrillation: Secondary | ICD-10-CM | POA: Diagnosis not present

## 2020-05-30 DIAGNOSIS — R778 Other specified abnormalities of plasma proteins: Secondary | ICD-10-CM | POA: Diagnosis not present

## 2020-05-30 DIAGNOSIS — K7689 Other specified diseases of liver: Secondary | ICD-10-CM | POA: Diagnosis not present

## 2020-05-30 DIAGNOSIS — Z800 Family history of malignant neoplasm of digestive organs: Secondary | ICD-10-CM

## 2020-05-30 DIAGNOSIS — E039 Hypothyroidism, unspecified: Secondary | ICD-10-CM | POA: Diagnosis present

## 2020-05-30 DIAGNOSIS — Z8551 Personal history of malignant neoplasm of bladder: Secondary | ICD-10-CM

## 2020-05-30 DIAGNOSIS — Z8546 Personal history of malignant neoplasm of prostate: Secondary | ICD-10-CM

## 2020-05-30 DIAGNOSIS — I248 Other forms of acute ischemic heart disease: Secondary | ICD-10-CM | POA: Diagnosis not present

## 2020-05-30 DIAGNOSIS — Z85828 Personal history of other malignant neoplasm of skin: Secondary | ICD-10-CM

## 2020-05-30 DIAGNOSIS — R000 Tachycardia, unspecified: Secondary | ICD-10-CM | POA: Diagnosis not present

## 2020-05-30 DIAGNOSIS — Z961 Presence of intraocular lens: Secondary | ICD-10-CM | POA: Diagnosis present

## 2020-05-30 DIAGNOSIS — Z888 Allergy status to other drugs, medicaments and biological substances status: Secondary | ICD-10-CM

## 2020-05-30 DIAGNOSIS — Z20822 Contact with and (suspected) exposure to covid-19: Secondary | ICD-10-CM | POA: Diagnosis present

## 2020-05-30 DIAGNOSIS — M5136 Other intervertebral disc degeneration, lumbar region: Secondary | ICD-10-CM | POA: Diagnosis not present

## 2020-05-30 DIAGNOSIS — I700 Atherosclerosis of aorta: Secondary | ICD-10-CM | POA: Diagnosis not present

## 2020-05-30 DIAGNOSIS — I10 Essential (primary) hypertension: Secondary | ICD-10-CM | POA: Diagnosis not present

## 2020-05-30 DIAGNOSIS — N99521 Infection of other external stoma of urinary tract: Principal | ICD-10-CM | POA: Diagnosis present

## 2020-05-30 DIAGNOSIS — K219 Gastro-esophageal reflux disease without esophagitis: Secondary | ICD-10-CM | POA: Diagnosis present

## 2020-05-30 DIAGNOSIS — N3001 Acute cystitis with hematuria: Secondary | ICD-10-CM | POA: Diagnosis not present

## 2020-05-30 DIAGNOSIS — Z906 Acquired absence of other parts of urinary tract: Secondary | ICD-10-CM

## 2020-05-30 DIAGNOSIS — E871 Hypo-osmolality and hyponatremia: Secondary | ICD-10-CM | POA: Diagnosis not present

## 2020-05-30 DIAGNOSIS — E0590 Thyrotoxicosis, unspecified without thyrotoxic crisis or storm: Secondary | ICD-10-CM | POA: Diagnosis present

## 2020-05-30 DIAGNOSIS — I77810 Thoracic aortic ectasia: Secondary | ICD-10-CM | POA: Diagnosis present

## 2020-05-30 DIAGNOSIS — R531 Weakness: Secondary | ICD-10-CM | POA: Diagnosis not present

## 2020-05-30 DIAGNOSIS — Z96642 Presence of left artificial hip joint: Secondary | ICD-10-CM | POA: Diagnosis present

## 2020-05-30 LAB — CBC
HCT: 44 % (ref 39.0–52.0)
Hemoglobin: 14.3 g/dL (ref 13.0–17.0)
MCH: 30.8 pg (ref 26.0–34.0)
MCHC: 32.5 g/dL (ref 30.0–36.0)
MCV: 94.8 fL (ref 80.0–100.0)
Platelets: 221 10*3/uL (ref 150–400)
RBC: 4.64 MIL/uL (ref 4.22–5.81)
RDW: 12.7 % (ref 11.5–15.5)
WBC: 13.7 10*3/uL — ABNORMAL HIGH (ref 4.0–10.5)
nRBC: 0 % (ref 0.0–0.2)

## 2020-05-30 LAB — BASIC METABOLIC PANEL
Anion gap: 14 (ref 5–15)
BUN: 8 mg/dL (ref 8–23)
CO2: 24 mmol/L (ref 22–32)
Calcium: 9.1 mg/dL (ref 8.9–10.3)
Chloride: 96 mmol/L — ABNORMAL LOW (ref 98–111)
Creatinine, Ser: 1.01 mg/dL (ref 0.61–1.24)
GFR calc Af Amer: >60 mL/min (ref >60)
GFR calc non Af Amer: >60 mL/min (ref >60)
Glucose, Bld: 95 mg/dL (ref 70–99)
Potassium: 3.9 mmol/L (ref 3.5–5.1)
Sodium: 134 mmol/L — ABNORMAL LOW (ref 135–145)

## 2020-05-30 LAB — URINALYSIS, ROUTINE W REFLEX MICROSCOPIC
Bilirubin Urine: NEGATIVE
Glucose, UA: NEGATIVE mg/dL
Ketones, ur: 20 mg/dL — AB
Nitrite: NEGATIVE
Protein, ur: NEGATIVE mg/dL
Specific Gravity, Urine: 1.01 (ref 1.005–1.030)
pH: 7 (ref 5.0–8.0)

## 2020-05-30 LAB — TROPONIN I (HIGH SENSITIVITY)
Troponin I (High Sensitivity): 116 ng/L (ref <18)
Troponin I (High Sensitivity): 123 ng/L (ref <18)
Troponin I (High Sensitivity): 110 ng/L (ref <18)

## 2020-05-30 LAB — SARS CORONAVIRUS 2 BY RT PCR (HOSPITAL ORDER, PERFORMED IN ~~LOC~~ HOSPITAL LAB): SARS Coronavirus 2: NEGATIVE

## 2020-05-30 LAB — LACTIC ACID, PLASMA: Lactic Acid, Venous: 2.4 mmol/L (ref 0.5–1.9)

## 2020-05-30 LAB — TSH: TSH: 0.11 u[IU]/mL — ABNORMAL LOW (ref 0.350–4.500)

## 2020-05-30 LAB — CBG MONITORING, ED: Glucose-Capillary: 82 mg/dL (ref 70–99)

## 2020-05-30 LAB — MAGNESIUM: Magnesium: 1.8 mg/dL (ref 1.7–2.4)

## 2020-05-30 LAB — BRAIN NATRIURETIC PEPTIDE: B Natriuretic Peptide: 392.5 pg/mL — ABNORMAL HIGH (ref 0.0–100.0)

## 2020-05-30 MED ORDER — ACETAMINOPHEN 650 MG RE SUPP: 650.0000 mg | Freq: Four times a day (QID) | RECTAL | Status: DC | PRN

## 2020-05-30 MED ORDER — SODIUM CHLORIDE 0.9 % IV SOLN
1.0000 g | INTRAVENOUS | Status: DC
  Administered 2020-05-31 – 2020-06-01 (×2): 1 g via INTRAVENOUS
  Filled 2020-05-30 (×2): qty 1

## 2020-05-30 MED ORDER — ZOLPIDEM TARTRATE 5 MG PO TABS
5.0000 mg | ORAL_TABLET | Freq: Every day | ORAL | Status: DC
  Administered 2020-05-30 – 2020-06-01 (×3): 5 mg via ORAL
  Filled 2020-05-30 (×3): qty 1

## 2020-05-30 MED ORDER — HEPARIN (PORCINE) 25000 UT/250ML-% IV SOLN
1100.0000 [IU]/h | INTRAVENOUS | Status: DC
  Administered 2020-05-30: 1100 [IU]/h via INTRAVENOUS
  Filled 2020-05-30: qty 250

## 2020-05-30 MED ORDER — LACTATED RINGERS IV BOLUS (SEPSIS)
1000.0000 mL | Freq: Once | INTRAVENOUS | Status: AC
  Administered 2020-05-30: 1000 mL via INTRAVENOUS

## 2020-05-30 MED ORDER — LEVOTHYROXINE SODIUM 100 MCG PO TABS: 100.0000 ug | ORAL_TABLET | Freq: Every day | ORAL | Status: DC

## 2020-05-30 MED ORDER — LACTATED RINGERS IV BOLUS (SEPSIS): 500.0000 mL | Freq: Once | INTRAVENOUS | Status: DC

## 2020-05-30 MED ORDER — LACTATED RINGERS IV SOLN: INTRAVENOUS | Status: DC

## 2020-05-30 MED ORDER — METOPROLOL TARTRATE 25 MG PO TABS
12.5000 mg | ORAL_TABLET | Freq: Two times a day (BID) | ORAL | Status: DC
  Administered 2020-05-30: 12.5 mg via ORAL
  Filled 2020-05-30: qty 1

## 2020-05-30 MED ORDER — ONDANSETRON HCL 4 MG PO TABS: 4.0000 mg | ORAL_TABLET | Freq: Four times a day (QID) | ORAL | Status: DC | PRN

## 2020-05-30 MED ORDER — GABAPENTIN 100 MG PO CAPS
200.0000 mg | ORAL_CAPSULE | Freq: Every day | ORAL | Status: DC
  Administered 2020-05-30 – 2020-06-01 (×3): 200 mg via ORAL
  Filled 2020-05-30 (×3): qty 2

## 2020-05-30 MED ORDER — ACETAMINOPHEN 325 MG PO TABS
650.0000 mg | ORAL_TABLET | Freq: Four times a day (QID) | ORAL | Status: DC | PRN
  Administered 2020-05-31: 650 mg via ORAL
  Filled 2020-05-30: qty 2

## 2020-05-30 MED ORDER — DILTIAZEM HCL 25 MG/5ML IV SOLN
20.0000 mg | Freq: Once | INTRAVENOUS | Status: AC
  Administered 2020-05-30: 20 mg via INTRAVENOUS
  Filled 2020-05-30: qty 5

## 2020-05-30 MED ORDER — ONDANSETRON HCL 4 MG/2ML IJ SOLN
4.0000 mg | Freq: Four times a day (QID) | INTRAMUSCULAR | Status: DC | PRN
  Administered 2020-05-31: 4 mg via INTRAVENOUS
  Filled 2020-05-30: qty 2

## 2020-05-30 MED ORDER — SODIUM CHLORIDE 0.9 % IV SOLN
1.0000 g | INTRAVENOUS | Status: DC
  Administered 2020-05-30: 1 g via INTRAVENOUS
  Filled 2020-05-30: qty 10

## 2020-05-30 MED ORDER — HEPARIN BOLUS VIA INFUSION
4000.0000 [IU] | Freq: Once | INTRAVENOUS | Status: AC
  Administered 2020-05-30: 4000 [IU] via INTRAVENOUS
  Filled 2020-05-30: qty 4000

## 2020-05-30 NOTE — ED Notes (Signed)
Unable to collect blood cultures

## 2020-05-30 NOTE — H&P (Addendum)
History and Physical    RIVAN SIORDIA ZYS:[id number] DOB: 04/05/1941 DOA: 05/30/2020  PCP: Burnard Bunting, MD  Patient coming from: Home  I have personally briefly reviewed patient's old medical records in [place]  Chief Complaint: A.Fib, Fatigue  HPI: MAJOR SANTERRE is a 79 y.o. male with medical history significant of HTN, HLD, bladder and prostate CA s/p cystectomy and ileal conduit.  Pt first diagnosed with A.Fib back in May 2021.  Converted to NSR on way to ER from PCP office.  Cards saw pt on 03/16/20 nl EKG at that time, patient then had echocardiogram performed 04/05/2020 where he was found to be in atrial flutter with low normal LV EF, moderate RAE, and mildly dilated aortic root.  Not on anticoagulation at this time.  Pt presents to ED today with generalized weakness, concern for UTI given sediment noted in urine.  En route with EMS pt noted to be in A.Fib RVR.   ED Course: Tm 100.3, WBC 13k.  HR initially A.Fib RVR with rates 120-140.  Given 20mg  IV cardizem, rate down to 80s-90s.  UA with many bacteria, no WBC, trace LE, mod HGB.  COVID negative, CXR neg.  Started on rocephin and given 30 cc/kg IVF bolus.  Trop 116.  No CP, no SOB.  No ischemic findings on EKG.  Hospitalist asked to admit.   Review of Systems: As per HPI, otherwise all review of systems negative.  Past Medical History:  Diagnosis Date  . Anxiety   . Arthritis   . Basal cell carcinoma 2004   x1-face  . Cataract    bilateral-may be removed 05/2015  . Complication of anesthesia    "took a while to wake up with triple hernia repair"  . DDD (degenerative disc disease), lumbar    L5-steroid injection 01/2015  . Depression   . Environmental allergies   . GERD (gastroesophageal reflux disease)   . Hematuria   . History of bladder cancer   . History of hiatal hernia   . History of prostate cancer   . History of skin cancer   . Hyperlipidemia   . Hypertension   . Hypothyroidism   .  Nocturia   . Numbness    4TH / 5TH FINGER TIPS  . Prostate cancer ([place])   . Scoliosis   . Squamous carcinoma 2008   x2- neck, face  . Urgency of urination   . Varicose veins     Past Surgical History:  Procedure Laterality Date  . APPENDECTOMY  1955  . CATARACT EXTRACTION    . CYSTOSCOPY N/A 12/19/2015   Procedure: CYSTOSCOPY;  Surgeon: Raynelle Bring, MD;  Location: [facility];  Service: Urology;  Laterality: N/A;  . CYSTOSCOPY W/ RETROGRADES Bilateral 05/09/2015   Procedure: CYSTOSCOPY WITH LEFT URETERAL CANNULATION;  Surgeon: Raynelle Bring, MD;  Location: [facility];  Service: Urology;  Laterality: Bilateral;  . CYSTOSCOPY W/ RETROGRADES Bilateral 05/04/2016   Procedure: CYSTOSCOPY WITH RETROGRADE PYELOGRAM;  Surgeon: Raynelle Bring, MD;  Location: [facility];  Service: Urology;  Laterality: Bilateral;  . ESOPHAGOGASTRODUODENOSCOPY (EGD) WITH ESOPHAGEAL DILATION  2004  . EYE SURGERY     cataracts with lens implants bilateral  . FOOT SURGERY  1980's   Morton's neuroma x3  . HERNIA REPAIR     triple laproscopic  . LYMPHADENECTOMY Bilateral 06/28/2016   Procedure: PELVIC LYMPHADENECTOMY;  Surgeon: Raynelle Bring, MD;  Location: [facility];  Service: Urology;  Laterality: Bilateral;  . MASS BIOPSY  bladder tumor biopsy  . PROSTATECTOMY  2010  . ROBOT ASSISTED LAPAROSCOPIC COMPLETE CYSTECT ILEAL CONDUIT N/A 06/28/2016   Procedure: XI ROBOTIC ASSISTED LAPAROSCOPIC COMPLETE CYSTECT ILEAL CONDUIT;  Surgeon: Raynelle Bring, MD;  Location: [facility];  Service: Urology;  Laterality: N/A;  . SKIN CANCER DESTRUCTION  2004   basal cell carcinoma  . TONSILLECTOMY  ~1949  . TOTAL HIP ARTHROPLASTY Left 08/16/2015   Procedure: LEFT TOTAL HIP ARTHROPLASTY ANTERIOR APPROACH;  Surgeon: Paralee Cancel, MD;  Location: [facility];  Service: Orthopedics;  Laterality: Left;  . TRANSURETHRAL RESECTION OF BLADDER TUMOR N/A 05/09/2015   Procedure: TRANSURETHRAL RESECTION OF BLADDER TUMOR (TURBT);  Surgeon: Raynelle Bring, MD;   Location[facility];  Service: Urology;  Laterality: N/A;  . TRANSURETHRAL RESECTION OF BLADDER TUMOR N/A 11/14/2015   Procedure: TRANSURETHRAL RESECTION OF BLADDER TUMOR (TURBT);  Surgeon: Raynelle Bring, MD;  Location[facility];  Service: Urology;  Laterality: N/A;  . TRANSURETHRAL RESECTION OF BLADDER TUMOR N/A 12/19/2015   Procedure: TRANSURETHRAL RESECTION OF BLADDER TUMOR (TURBT);  Surgeon: Raynelle Bring, MD;  Location[facility];  Service: Urology;  Laterality: N/A;  . TRANSURETHRAL RESECTION OF BLADDER TUMOR N/A 05/04/2016   Procedure: TRANSURETHRAL RESECTION OF BLADDER TUMOR (TURBT);  Surgeon: Raynelle Bring, MD;  [facility];  Service: Urology;  Laterality: N/A;  . VEIN SURGERY Left 2011   leg     reports that he has never smoked. He has never used smokeless tobacco. He reports that he does not drink alcohol and does not use drugs.  Allergies  Allergen Reactions  . Cefdinir Other (See Comments)    Muscle pain  . Myrbetriq [Mirabegron] Itching    Family History  Problem Relation Age of Onset  . Colon cancer Maternal Aunt   . Esophageal cancer Neg Hx   . Stomach cancer Neg Hx   . Rectal cancer Neg Hx      Prior to Admission medications   Medication Sig Start Date End Date Taking? Authorizing Provider  doxycycline (VIBRA-TABS) 100 MG tablet Take 100 mg by mouth 2 (two) times daily. 03/15/20   [provider]  gabapentin (NEURONTIN) 100 MG capsule Take 100 mg by mouth at bedtime.     [provider]  levothyroxine (SYNTHROID) 100 MCG tablet Take 100 mcg by mouth daily before breakfast.  01/21/20   [provider]  loratadine (CLARITIN) 10 MG tablet Take 10 mg by mouth daily as needed for allergies.     [provider]  omeprazole (PRILOSEC) 20 MG capsule Take 20 mg by mouth every evening. 1 hour before breakfast.    [provider]  Probiotic Product (PROBIOTIC DAILY PO) Take 1 capsule by mouth every morning. [facility] colon health.     [provider]  psyllium (METAMUCIL) 58.6 % packet Take 1 packet by mouth daily.    [provider]  psyllium (REGULOID) 0.52 g capsule Take 0.52 g by mouth daily.     [provider]  zolpidem (AMBIEN) 10 MG tablet Take 5 mg by mouth at bedtime.     [provider]    Physical Exam: Vitals:   05/30/20 1730 05/30/20 1855 05/30/20 1900 05/30/20 2000  BP: 111/70 130/80 126/77 132/86  Pulse: 80 68 (!) 102 76  Resp: 18 12 14 14   Temp:   99.9 F (37.7 C)   TempSrc:      SpO2: 100% 100% 98% 100%  Height:        Constitutional: NAD, calm, comfortable  Eyes: PERRL, lids and conjunctivae normal ENMT: Mucous membranes are moist. Posterior pharynx clear of any exudate or lesions.Normal dentition.  Neck: normal, supple, no masses, no thyromegaly Respiratory: clear to auscultation bilaterally, no wheezing, no crackles. Normal respiratory effort. No accessory muscle use.  Cardiovascular: Regular rate and rhythm, no murmurs / rubs / gallops. No extremity edema. 2+ pedal pulses. No carotid bruits.  Abdomen: no tenderness, no masses palpated. No hepatosplenomegaly. Bowel sounds positive.  Musculoskeletal: no clubbing / cyanosis. No joint deformity upper and lower extremities. Good ROM, no contractures. Normal muscle tone.  Skin: no rashes, lesions, ulcers. No induration Neurologic: CN 2-12 grossly intact. Sensation intact, DTR normal. Strength 5/5 in all 4.  Psychiatric: Normal judgment and insight. Alert and oriented x 3. Normal mood.    Labs on Admission: I have personally reviewed following labs and imaging studies  CBC: Recent Labs  Lab 05/30/20 1702  WBC 13.7*  HGB 14.3  HCT 44.0  MCV 94.8  PLT 440   Basic Metabolic Panel: Recent Labs  Lab 05/30/20 1702 05/30/20 1935  NA 134*  --   K 3.9  --   CL 96*  --   CO2 24  --   GLUCOSE 95  --   BUN 8  --   CREATININE 1.01  --   CALCIUM 9.1  --   MG  --  1.8   GFR: CrCl cannot be calculated  (Unknown ideal weight.). Liver Function Tests: No results for input(s): AST, ALT, ALKPHOS, BILITOT, PROT, ALBUMIN in the last 168 hours. No results for input(s): LIPASE, AMYLASE in the last 168 hours. No results for input(s): AMMONIA in the last 168 hours. Coagulation Profile: No results for input(s): INR, PROTIME in the last 168 hours. Cardiac Enzymes: No results for input(s): CKTOTAL, CKMB, CKMBINDEX, TROPONINI in the last 168 hours. BNP (last 3 results) No results for input(s): PROBNP in the last 8760 hours. HbA1C: No results for input(s): HGBA1C in the last 72 hours. CBG: Recent Labs  Lab 05/30/20 1834  GLUCAP 82   Lipid Profile: No results for input(s): CHOL, HDL, LDLCALC, TRIG, CHOLHDL, LDLDIRECT in the last 72 hours. Thyroid Function Tests: No results for input(s): TSH, T4TOTAL, FREET4, T3FREE, THYROIDAB in the last 72 hours. Anemia Panel: No results for input(s): VITAMINB12, FOLATE, FERRITIN, TIBC, IRON, RETICCTPCT in the last 72 hours. Urine analysis:    Component Value Date/Time   COLORURINE YELLOW 05/30/2020 1721   APPEARANCEUR HAZY (A) 05/30/2020 1721   LABSPEC 1.010 05/30/2020 1721   PHURINE 7.0 05/30/2020 1721   GLUCOSEU NEGATIVE 05/30/2020 1721   HGBUR MODERATE (A) 05/30/2020 1721   BILIRUBINUR NEGATIVE 05/30/2020 1721   KETONESUR 20 (A) 05/30/2020 1721   PROTEINUR NEGATIVE 05/30/2020 1721   UROBILINOGEN 0.2 08/05/2015 0807   NITRITE NEGATIVE 05/30/2020 1721   LEUKOCYTESUR TRACE (A) 05/30/2020 1721    Radiological Exams on Admission: DG Chest Portable 1 View  Result Date: 05/30/2020 CLINICAL DATA:  Atrial fibrillation EXAM: PORTABLE CHEST 1 VIEW COMPARISON:  03/10/2020 chest radiograph. FINDINGS: Stable cardiomediastinal silhouette with normal heart size. No pneumothorax. No pleural effusion. No pulmonary edema. Mild platelike lateral left basilar scarring versus atelectasis. No acute consolidative airspace disease. IMPRESSION: Mild platelike lateral left  basilar scarring versus atelectasis. Otherwise no active disease. Electronically Signed   By: Ilona Sorrel M.D.   On: 05/30/2020 17:57   CT Renal Stone Study  Result Date: 05/30/2020 CLINICAL DATA:  Hematuria.  History of prostate cancer. EXAM: CT ABDOMEN AND PELVIS WITHOUT  CONTRAST TECHNIQUE: Multidetector CT imaging of the abdomen and pelvis was performed following the standard protocol without IV contrast. COMPARISON:  CT AP 12/21/2016 FINDINGS: Lower chest: No acute abnormality. Hepatobiliary: 7 mm low-attenuation structure along the dome of liver is unchanged and remains too small to characterize but favored to represent a small cyst. Gallbladder appears normal. No biliary dilatation. Pancreas: Unremarkable. No pancreatic ductal dilatation or surrounding inflammatory changes. Spleen: Normal in size without focal abnormality. Adrenals/Urinary Tract: Normal appearance of the adrenal glands. No kidney stones identified bilaterally. No hydronephrosis. Cystectomy with right lower quadrant ileal conduit. Stomach/Bowel: Stomach is within normal limits. Appendix appears normal. No evidence of bowel wall thickening, distention, or inflammatory changes. Vascular/Lymphatic: Aortic atherosclerosis. No aneurysm. No abdominal or pelvic adenopathy. Reproductive: Prostatectomy. Other: No free fluid or fluid collections identified. Musculoskeletal: Scoliosis and multilevel degenerative disc disease identified within the lumbar spine. No acute or suspicious osseous abnormalities. Previous left hip arthroplasty. IMPRESSION: 1. No acute findings within the abdomen or pelvis. No findings to explain patient's hematuria. 2. Status post cystoprostatectomy with right lower quadrant ileal conduit. 3. No evidence for metastatic disease. 4. Aortic atherosclerosis. Aortic Atherosclerosis (ICD10-I70.0). Electronically Signed   By: Kerby Moors M.D.   On: 05/30/2020 20:34    EKG: Independently reviewed.  Assessment/Plan Principal  Problem:   Sepsis ([place]) Active Problems:   Atrial fibrillation with RVR ([facility])   Elevated troponin    1. Sepsis - suspect urinary source given reported change in urine in ostomy 1. Sepsis pathway 2. 2L IVF bolus in ED for the moment 3. UCx pending 4. Checking CT abd/pelvis renal stone protocol to see if any further evidence regarding source 5. COVID neg, CXR neg, no rash, no O2 requirement, no SOB, no CP. 6. Will leave on rocephin for the moment 2. A.Fib RVR - 1. Rate controlled post cardizem 2. Putting pt on Metoprolol 12.5mg  PO BID for the moment 3. Tele monitor 4. Heparin gtt for the moment 3. Elevated troponin - 1. Suspect demand ischemia due to sepsis / A.Fib RVR 2. Check serial trops 3. EKG without ischemic findings 4. No CP, no SOB, doubt plaque wall rupture ACS at this time. 4. Hypothyroidism - 1. TSH actually low today, suggesting possible over correction with his home synthroid. 2. Holding home synthroid and checking F T4.  DVT prophylaxis: heparin gtt for A.Fib Code Status: Full Family Communication: Daughter at bedside Disposition Plan: Home after admit Consults called: None Admission status: Place in 29   Hasel Janish, [facility]  How to contact the [facility] Attending or Consulting provider [place] or covering provider during after hours [place], for this patient?  1. Check the care team in [facility] and look for a) attending/consulting [facility] provider listed and b) the [facility] team listed 2. Log into [email or web address]  Amion Physician Scheduling and messaging for groups and whole hospitals  On call and physician scheduling software for group practices, residents, hospitalists and other medical providers for call, clinic, rotation and shift schedules. On[employer] is a hospital-wide system for scheduling doctors and paging doctors on call. EasyPlot is for scientific plotting and data analysis.  [email or web address]  and use [place]'s universal password to access.  If you do not have the password, please contact the hospital operator.  3. Locate the [facility] provider you are looking for under [facility] and page to a number that you can be directly reached. 4. If you still have difficulty reaching the provider, please page the  DOC (Director on Call) for the Hospitalists listed on amion for assistance.  05/30/2020, 8:38 PM

## 2020-05-30 NOTE — Progress Notes (Signed)
ANTICOAGULATION CONSULT NOTE - Initial Consult  Pharmacy Consult for IV heparin Indication: atrial fibrillation  Allergies  Allergen Reactions   Cefdinir Other (See Comments)    Muscle pain   Myrbetriq [Mirabegron] Itching    Patient Measurements: Height: 6' 3.5" (191.8 cm) IBW/kg (Calculated) : 85.65 Heparin Dosing Weight: TBW  Vital Signs: Temp: 99 F (37.2 C) (08/16 2100) Temp Source: Oral (08/16 1530) BP: 120/86 (08/16 2100) Pulse Rate: 83 (08/16 2100)  Labs: Recent Labs    05/30/20 1702 05/30/20 1935  HGB 14.3  --   HCT 44.0  --   PLT 221  --   CREATININE 1.01  --   TROPONINIHS 116* 123*    CrCl cannot be calculated (Unknown ideal weight.).   Medical History: Past Medical History:  Diagnosis Date   Anxiety    Arthritis    Basal cell carcinoma 2004   x1-face   Cataract    bilateral-may be removed 10/313   Complication of anesthesia    "took a while to wake up with triple hernia repair"   DDD (degenerative disc disease), lumbar    L5-steroid injection 01/2015   Depression    Environmental allergies    GERD (gastroesophageal reflux disease)    Hematuria    History of bladder cancer    History of hiatal hernia    History of prostate cancer    History of skin cancer    Hyperlipidemia    Hypertension    Hypothyroidism    Nocturia    Numbness    4TH / 5TH FINGER TIPS   Prostate cancer ([place])    Scoliosis    Squamous carcinoma 2008   x2- neck, face   Urgency of urination    Varicose veins     Medications:  (Not in a hospital admission)  Scheduled:   gabapentin  200 mg Oral QHS   heparin  4,000 Units Intravenous Once   metoprolol tartrate  12.5 mg Oral BID   zolpidem  5 mg Oral QHS   Infusions:   [START ON 05/31/2020] cefTRIAXone (ROCEPHIN)  IV     heparin     lactated ringers     lactated ringers     PRN: acetaminophen **OR** acetaminophen, ondansetron **OR** ondansetron (ZOFRAN)  IV   Assessment: 21 yoM with PMH HTN, HLD, bladder/prostate CA, presents with fatigue; found to be in Afib. First episode in May 2021; has never been placed on anticoagulation   Baseline INR, aPTT: not done  Prior anticoagulation: none  Significant events:  Today, 05/30/2020:  CBC: WNL  SCr at baseline  No bleeding or infusion issues per nursing  Goal of Therapy: Heparin level 0.3-0.7 units/ml Monitor platelets by anticoagulation protocol: Yes  Plan:  Heparin 4000 units IV bolus x 1  Heparin 1100 units/hr IV infusion  Check heparin level 8 hrs after start  Daily CBC, daily heparin level once stable  Monitor for signs of bleeding or thrombosis  Reuel Boom, PharmD, BCPS [phone] 05/30/2020, 9:21 PM

## 2020-05-30 NOTE — ED Provider Notes (Signed)
MSE was initiated and I personally evaluated the patient and placed orders (if any) at  4:52 PM on May 30, 2020.  Pt was seen as part of the triage process by me in a triage room. Pt presented for weakness but in the ambulance noted to be in a fib  Gen Alert, awake, in no distress Car:  Tachycardic irregular Lungs CTA Abd foley in place  Pt noted to be in a fib RVR on monitor.  Will need to go to a bed asap for complete evaluation.  Labs and xray ordered.  IV cardizem ordered for rate control.    Dorie Rank, MD 05/30/20 [phone]

## 2020-05-30 NOTE — ED Triage Notes (Signed)
Per [facility] pt from home for generalized weakness and possible UTI. Pt has foley and ostomy. Pt was given NS 466ml in route and when re-assessed radial pulse felt abnormal and EKG showed afib. Pt has no hx afib 120-140bpm.  Vitals: 136/78.  18 left AC.

## 2020-05-30 NOTE — ED Provider Notes (Signed)
[place] DEPT Provider Note   CSN: [id number] Arrival date & time: 05/30/20  1515     History Chief Complaint  Patient presents with  . Fatigue  . Atrial Fibrillation    Ronald Lowery is a 79 y.o. male with PMH of HTN, HLD, hypothyroidism on Synthroid, and bladder cancer s/p foley and ostomy placement who presents to the ED via EMS from home for weakness and concern for UTI.  In route, patient was noted to be in atrial fibrillation with RVR.  MSE was performed and patient was given Cardizem prior to my examination.  Patient was seen in the ER in May 2021 and had arrhythmia, but he was deemed stable for discharge and outpatient follow-up.  Patient saw Dr. Shawna Orleans with [facility] 03/16/2020 and his EKG was normal on that exam.  Patient then had echocardiogram performed 04/05/2020 where he was found to be in atrial flutter with low normal LV EF, moderate RAE, and mildly dilated aortic root.    On my assessment, patient is resting comfortably in bed.  His daughter-in-law, and RN at [facility] oncology division is at bedside and provides much of the history.  Evidently, patient lives at home with his wife and he has been particularly lethargic, weak, and mildly confused the past couple of days.  He has a history of urosepsis, most recently in January 2021.  Followed by Dr. Alinda Money, urology.  That is their primary concern.  T-max at home of 101 F.  He denies any new or different back pain, headache or dizziness, cough or shortness of breath, chest pain, orthopnea, abdominal pain, nausea vomiting, or changes in bowel habits.  HPI     Past Medical History:  Diagnosis Date  . Anxiety   . Arthritis   . Basal cell carcinoma 2004   x1-face  . Cataract    bilateral-may be removed 05/2015  . Complication of anesthesia    "took a while to wake up with triple hernia repair"  . DDD (degenerative disc disease), lumbar    L5-steroid injection 01/2015  . Depression   .  Environmental allergies   . GERD (gastroesophageal reflux disease)   . Hematuria   . History of bladder cancer   . History of hiatal hernia   . History of prostate cancer   . History of skin cancer   . Hyperlipidemia   . Hypertension   . Hypothyroidism   . Nocturia   . Numbness    4TH / 5TH FINGER TIPS  . Prostate cancer ([place])   . Scoliosis   . Squamous carcinoma 2008   x2- neck, face  . Urgency of urination   . Varicose veins     Patient Active Problem List   Diagnosis Date Noted  . Fever and chills 07/07/2016  . Bladder cancer ([place]) 06/28/2016  . S/P left THA, AA 08/16/2015    Past Surgical History:  Procedure Laterality Date  . APPENDECTOMY  1955  . CATARACT EXTRACTION    . CYSTOSCOPY N/A 12/19/2015   Procedure: CYSTOSCOPY;  Surgeon: Raynelle Bring, MD;  Location: [facility];  Service: Urology;  Laterality: N/A;  . CYSTOSCOPY W/ RETROGRADES Bilateral 05/09/2015   Procedure: CYSTOSCOPY WITH LEFT URETERAL CANNULATION;  Surgeon: Raynelle Bring, MD;  Location: [facility];  Service: Urology;  Laterality: Bilateral;  . CYSTOSCOPY W/ RETROGRADES Bilateral 05/04/2016   Procedure: CYSTOSCOPY WITH RETROGRADE PYELOGRAM;  Surgeon: Raynelle Bring, MD;  Location: [facility];  Service: Urology;  Laterality: Bilateral;  .  ESOPHAGOGASTRODUODENOSCOPY (EGD) WITH ESOPHAGEAL DILATION  2004  . EYE SURGERY     cataracts with lens implants bilateral  . FOOT SURGERY  1980's   Morton's neuroma x3  . HERNIA REPAIR     triple laproscopic  . LYMPHADENECTOMY Bilateral 06/28/2016   Procedure: PELVIC LYMPHADENECTOMY;  Surgeon: Raynelle Bring, MD;  Location: [facility];  Service: Urology;  Laterality: Bilateral;  . MASS BIOPSY     bladder tumor biopsy  . PROSTATECTOMY  2010  . ROBOT ASSISTED LAPAROSCOPIC COMPLETE CYSTECT ILEAL CONDUIT N/A 06/28/2016   Procedure: XI ROBOTIC ASSISTED LAPAROSCOPIC COMPLETE CYSTECT ILEAL CONDUIT;  Surgeon: Raynelle Bring, MD;  Location: [facility];  Service: Urology;  Laterality: N/A;  . SKIN  CANCER DESTRUCTION  2004   basal cell carcinoma  . TONSILLECTOMY  ~1949  . TOTAL HIP ARTHROPLASTY Left 08/16/2015   Procedure: LEFT TOTAL HIP ARTHROPLASTY ANTERIOR APPROACH;  Surgeon: Paralee Cancel, MD;  Location: [facility];  Service: Orthopedics;  Laterality: Left;  . TRANSURETHRAL RESECTION OF BLADDER TUMOR N/A 05/09/2015   Procedure: TRANSURETHRAL RESECTION OF BLADDER TUMOR (TURBT);  Surgeon: Raynelle Bring, MD;  Location: [facility];  Service: Urology;  Laterality: N/A;  . TRANSURETHRAL RESECTION OF BLADDER TUMOR N/A 11/14/2015   Procedure: TRANSURETHRAL RESECTION OF BLADDER TUMOR (TURBT);  Surgeon: Raynelle Bring, MD;  Location: [facility];  Service: Urology;  Laterality: N/A;  . TRANSURETHRAL RESECTION OF BLADDER TUMOR N/A 12/19/2015   Procedure: TRANSURETHRAL RESECTION OF BLADDER TUMOR (TURBT);  Surgeon: Raynelle Bring, MD;  Location: [facility];  Service: Urology;  Laterality: N/A;  . TRANSURETHRAL RESECTION OF BLADDER TUMOR N/A 05/04/2016   Procedure: TRANSURETHRAL RESECTION OF BLADDER TUMOR (TURBT);  Surgeon: Raynelle Bring, MD;  Location: [facility];  Service: Urology;  Laterality: N/A;  . VEIN SURGERY Left 2011   leg       Family History  Problem Relation Age of Onset  . Colon cancer Maternal Aunt   . Esophageal cancer Neg Hx   . Stomach cancer Neg Hx   . Rectal cancer Neg Hx     Social History   Tobacco Use  . Smoking status: Never Smoker  . Smokeless tobacco: Never Used  Substance Use Topics  . Alcohol use: No  . Drug use: No    Home Medications Prior to Admission medications   Medication Sig Start Date End Date Taking? Authorizing Provider  doxycycline (VIBRA-TABS) 100 MG tablet Take 100 mg by mouth 2 (two) times daily. 03/15/20   [provider]  gabapentin (NEURONTIN) 100 MG capsule Take 100 mg by mouth at bedtime.     [provider]  levothyroxine (SYNTHROID) 100 MCG tablet Take 100 mcg by mouth daily before breakfast.  01/21/20   [provider]  loratadine  (CLARITIN) 10 MG tablet Take 10 mg by mouth daily as needed for allergies.     [provider]  omeprazole (PRILOSEC) 20 MG capsule Take 20 mg by mouth every evening. 1 hour before breakfast.    [provider]  Probiotic Product (PROBIOTIC DAILY PO) Take 1 capsule by mouth every morning. [facility].    [provider]  psyllium (METAMUCIL) 58.6 % packet Take 1 packet by mouth daily.    [provider]  psyllium (REGULOID) 0.52 g capsule Take 0.52 g by mouth daily.     [provider]  zolpidem (AMBIEN) 10 MG tablet Take 5 mg by mouth at bedtime.     [provider]    Allergies  Cefdinir and Myrbetriq [mirabegron]  Review of Systems   Review of Systems  All other systems reviewed and are negative.   Physical Exam Updated Vital Signs BP 130/80   Pulse 68   Temp 100.3 F (37.9 C) (Oral)   Resp 12   Ht 6' 3.5" (1.918 m)   SpO2 100%   BMI 22.10 kg/m   Physical Exam Vitals and nursing note reviewed. Exam conducted with a chaperone present.  Constitutional:      General: He is not in acute distress.    Appearance: He is ill-appearing.  HENT:     Head: Normocephalic and atraumatic.  Eyes:     General: No scleral icterus.    Conjunctiva/sclera: Conjunctivae normal.  Cardiovascular:     Rate and Rhythm: Normal rate. Rhythm irregular.     Pulses: Normal pulses.     Heart sounds: Normal heart sounds.  Pulmonary:     Comments: No increased work of breathing.  Symmetric chest rise. Abdominal:     General: Abdomen is flat. There is no distension.     Palpations: Abdomen is soft.     Tenderness: There is no abdominal tenderness.     Comments: Negative CVAT bilaterally.   Musculoskeletal:     Comments: No pitting edema or overlying skin changes involving lower extremities.  Skin:    General: Skin is dry.     Capillary Refill: Capillary refill takes less than 2 seconds.  Neurological:     Mental Status: He is  alert.     GCS: GCS eye subscore is 4. GCS verbal subscore is 5. GCS motor subscore is 6.  Psychiatric:        Mood and Affect: Mood normal.        Behavior: Behavior normal.        Thought Content: Thought content normal.     ED Results / Procedures / Treatments   Labs (all labs ordered are listed, but only abnormal results are displayed) Labs Reviewed  BASIC METABOLIC PANEL - Abnormal; Notable for the following components:      Result Value   Sodium 134 (*)    Chloride 96 (*)    All other components within normal limits  CBC - Abnormal; Notable for the following components:   WBC 13.7 (*)    All other components within normal limits  URINALYSIS, ROUTINE W REFLEX MICROSCOPIC - Abnormal; Notable for the following components:   APPearance HAZY (*)    Hgb urine dipstick MODERATE (*)    Ketones, ur 20 (*)    Leukocytes,Ua TRACE (*)    Bacteria, UA MANY (*)    All other components within normal limits  BRAIN NATRIURETIC PEPTIDE - Abnormal; Notable for the following components:   B Natriuretic Peptide 392.5 (*)    All other components within normal limits  LACTIC ACID, PLASMA - Abnormal; Notable for the following components:   Lactic Acid, Venous 2.4 (*)    All other components within normal limits  TROPONIN I (HIGH SENSITIVITY) - Abnormal; Notable for the following components:   Troponin I (High Sensitivity) 116 (*)    All other components within normal limits  SARS CORONAVIRUS 2 BY RT PCR (HOSPITAL ORDER, [facility])  CULTURE, BLOOD (ROUTINE X 2)  CULTURE, BLOOD (ROUTINE X 2)  URINE CULTURE  CULTURE, BLOOD (SINGLE)  TSH  MAGNESIUM  CBG MONITORING, ED  TROPONIN I (HIGH SENSITIVITY)    EKG EKG Interpretation  Date/Time:  Monday May 30 2020 18:59:23 EDT Ventricular Rate:  85 PR Interval:    QRS Duration: 107 QT Interval:  396 QTC Calculation: 418 R Axis:   20 Text Interpretation: Atrial fibrillation Low voltage, precordial leads  Confirmed by Dene Gentry [phone]) on 05/30/2020 7:09:18 PM   Radiology DG Chest Portable 1 View  Result Date: 05/30/2020 CLINICAL DATA:  Atrial fibrillation EXAM: PORTABLE CHEST 1 VIEW COMPARISON:  03/10/2020 chest radiograph. FINDINGS: Stable cardiomediastinal silhouette with normal heart size. No pneumothorax. No pleural effusion. No pulmonary edema. Mild platelike lateral left basilar scarring versus atelectasis. No acute consolidative airspace disease. IMPRESSION: Mild platelike lateral left basilar scarring versus atelectasis. Otherwise no active disease. Electronically Signed   By: Ilona Sorrel M.D.   On: 05/30/2020 17:57    Procedures Procedures (including critical care time)  Medications Ordered in ED Medications  lactated ringers infusion (has no administration in time range)  lactated ringers bolus 1,000 mL (has no administration in time range)    And  lactated ringers bolus 1,000 mL (has no administration in time range)    And  lactated ringers bolus 500 mL (has no administration in time range)  cefTRIAXone (ROCEPHIN) 1 g in sodium chloride 0.9 % 100 mL IVPB (has no administration in time range)  diltiazem (CARDIZEM) injection 20 mg (20 mg Intravenous Given 05/30/20 1709)    ED Course  I have reviewed the triage vital signs and the nursing notes.  Pertinent labs & imaging results that were available during my care of the patient were reviewed by me and considered in my medical decision making (see chart for details).    MDM Rules/Calculators/A&P                          Labs CBC: New leukocytosis to 13.7. Lactic acid: 2.4 >> BNP: Elevated to 392.5, increased from 90 in labs obtained 2 months ago. Troponin: Elevated at 116.  Will trend.   BMP: Unremarkable. UA: Many bacteria, but negative nitrate, 0-5 RBC/WBC.  Imaging I personally reviewed plain films of chest which demonstrate no pleural effusion or other acute cardiopulmonary findings.  EKG First EKG  concerning for atrial fibrillation with RVR. Repeat EKG continues to show atrial fibrillation, now rate controlled.  UA is not particularly impressive, but evidence to suggest infection.  He does have new leukocytosis in conjunction with his fatigue, weakness, mild confusion, and T-max of 101 F.  Will administer broad-spectrum antibiotics after urine culture and blood cultures collected.   Will plan for admission in context of urosepsis.  Patient is still in atrial fibrillation, but rate controlled.  Not anticoagulated.  Recent echo in June. Will check TSH and magnesium and then admit to hospitalist.  Discussed case with Dr. Francia Greaves who personally evaluated patient and agrees with assessment and plan.  Spoke with Dr. Alcario Drought who will see and admit patient.  He is recommending CT renal stone study to evaluate for hydronephrosis versus infected kidney stone.  Source remains unclear.  Otherwise agrees with current plan.    Final Clinical Impression(s) / ED Diagnoses Final diagnoses:  Acute cystitis with hematuria  Atrial fibrillation, unspecified type [facility])    Rx / DC Orders ED Discharge Orders    None       Corena Herter, PA-C 05/30/20 1940    Valarie Merino, MD 05/30/20 2143

## 2020-05-30 NOTE — ED Notes (Signed)
Patient has a blue in the main lab

## 2020-05-31 ENCOUNTER — Other Ambulatory Visit: Payer: Self-pay

## 2020-05-31 DIAGNOSIS — N99521 Infection of other external stoma of urinary tract: Secondary | ICD-10-CM | POA: Diagnosis present

## 2020-05-31 DIAGNOSIS — Z961 Presence of intraocular lens: Secondary | ICD-10-CM | POA: Diagnosis present

## 2020-05-31 DIAGNOSIS — I77810 Thoracic aortic ectasia: Secondary | ICD-10-CM | POA: Diagnosis present

## 2020-05-31 DIAGNOSIS — E871 Hypo-osmolality and hyponatremia: Secondary | ICD-10-CM | POA: Diagnosis present

## 2020-05-31 DIAGNOSIS — I248 Other forms of acute ischemic heart disease: Secondary | ICD-10-CM | POA: Diagnosis present

## 2020-05-31 DIAGNOSIS — I700 Atherosclerosis of aorta: Secondary | ICD-10-CM | POA: Diagnosis present

## 2020-05-31 DIAGNOSIS — I4891 Unspecified atrial fibrillation: Secondary | ICD-10-CM | POA: Diagnosis not present

## 2020-05-31 DIAGNOSIS — Z906 Acquired absence of other parts of urinary tract: Secondary | ICD-10-CM | POA: Diagnosis not present

## 2020-05-31 DIAGNOSIS — I10 Essential (primary) hypertension: Secondary | ICD-10-CM | POA: Diagnosis present

## 2020-05-31 DIAGNOSIS — Z79899 Other long term (current) drug therapy: Secondary | ICD-10-CM | POA: Diagnosis not present

## 2020-05-31 DIAGNOSIS — E039 Hypothyroidism, unspecified: Secondary | ICD-10-CM | POA: Diagnosis present

## 2020-05-31 DIAGNOSIS — Z8546 Personal history of malignant neoplasm of prostate: Secondary | ICD-10-CM | POA: Diagnosis not present

## 2020-05-31 DIAGNOSIS — Z800 Family history of malignant neoplasm of digestive organs: Secondary | ICD-10-CM | POA: Diagnosis not present

## 2020-05-31 DIAGNOSIS — Z8551 Personal history of malignant neoplasm of bladder: Secondary | ICD-10-CM | POA: Diagnosis not present

## 2020-05-31 DIAGNOSIS — Z888 Allergy status to other drugs, medicaments and biological substances status: Secondary | ICD-10-CM | POA: Diagnosis not present

## 2020-05-31 DIAGNOSIS — Z85828 Personal history of other malignant neoplasm of skin: Secondary | ICD-10-CM | POA: Diagnosis not present

## 2020-05-31 DIAGNOSIS — A419 Sepsis, unspecified organism: Secondary | ICD-10-CM | POA: Diagnosis present

## 2020-05-31 DIAGNOSIS — Z79890 Hormone replacement therapy (postmenopausal): Secondary | ICD-10-CM | POA: Diagnosis not present

## 2020-05-31 DIAGNOSIS — M5136 Other intervertebral disc degeneration, lumbar region: Secondary | ICD-10-CM | POA: Diagnosis present

## 2020-05-31 DIAGNOSIS — K219 Gastro-esophageal reflux disease without esophagitis: Secondary | ICD-10-CM | POA: Diagnosis present

## 2020-05-31 DIAGNOSIS — Z96642 Presence of left artificial hip joint: Secondary | ICD-10-CM | POA: Diagnosis present

## 2020-05-31 DIAGNOSIS — E0590 Thyrotoxicosis, unspecified without thyrotoxic crisis or storm: Secondary | ICD-10-CM | POA: Diagnosis present

## 2020-05-31 DIAGNOSIS — R778 Other specified abnormalities of plasma proteins: Secondary | ICD-10-CM

## 2020-05-31 DIAGNOSIS — I4892 Unspecified atrial flutter: Secondary | ICD-10-CM | POA: Diagnosis present

## 2020-05-31 DIAGNOSIS — Z20822 Contact with and (suspected) exposure to covid-19: Secondary | ICD-10-CM | POA: Diagnosis present

## 2020-05-31 DIAGNOSIS — I480 Paroxysmal atrial fibrillation: Secondary | ICD-10-CM | POA: Diagnosis present

## 2020-05-31 LAB — CORTISOL-AM, BLOOD: Cortisol - AM: 12.1 ug/dL (ref 6.7–22.6)

## 2020-05-31 LAB — BASIC METABOLIC PANEL
Anion gap: 11 (ref 5–15)
BUN: 8 mg/dL (ref 8–23)
CO2: 24 mmol/L (ref 22–32)
Calcium: 8.3 mg/dL — ABNORMAL LOW (ref 8.9–10.3)
Chloride: 97 mmol/L — ABNORMAL LOW (ref 98–111)
Creatinine, Ser: 0.88 mg/dL (ref 0.61–1.24)
GFR calc Af Amer: >60 mL/min (ref >60)
GFR calc non Af Amer: >60 mL/min (ref >60)
Glucose, Bld: 105 mg/dL — ABNORMAL HIGH (ref 70–99)
Potassium: 3.7 mmol/L (ref 3.5–5.1)
Sodium: 132 mmol/L — ABNORMAL LOW (ref 135–145)

## 2020-05-31 LAB — T4, FREE: Free T4: 1.54 ng/dL — ABNORMAL HIGH (ref 0.61–1.12)

## 2020-05-31 LAB — TROPONIN I (HIGH SENSITIVITY): Troponin I (High Sensitivity): 117 ng/L (ref <18)

## 2020-05-31 LAB — URINE CULTURE

## 2020-05-31 LAB — CBC
HCT: 38 % — ABNORMAL LOW (ref 39.0–52.0)
Hemoglobin: 12.6 g/dL — ABNORMAL LOW (ref 13.0–17.0)
MCH: 30.4 pg (ref 26.0–34.0)
MCHC: 33.2 g/dL (ref 30.0–36.0)
MCV: 91.8 fL (ref 80.0–100.0)
Platelets: 209 10*3/uL (ref 150–400)
RBC: 4.14 MIL/uL — ABNORMAL LOW (ref 4.22–5.81)
RDW: 12.7 % (ref 11.5–15.5)
WBC: 9.1 10*3/uL (ref 4.0–10.5)
nRBC: 0 % (ref 0.0–0.2)

## 2020-05-31 LAB — PROTIME-INR
INR: 1.2 (ref 0.8–1.2)
Prothrombin Time: 14.5 seconds (ref 11.4–15.2)

## 2020-05-31 LAB — HEPARIN LEVEL (UNFRACTIONATED): Heparin Unfractionated: 0.64 IU/mL (ref 0.30–0.70)

## 2020-05-31 LAB — PROCALCITONIN: Procalcitonin: <0.1 ng/mL

## 2020-05-31 MED ORDER — APIXABAN 5 MG PO TABS
5.0000 mg | ORAL_TABLET | Freq: Two times a day (BID) | ORAL | Status: DC
  Administered 2020-05-31 – 2020-06-02 (×5): 5 mg via ORAL
  Filled 2020-05-31 (×5): qty 1

## 2020-05-31 MED ORDER — DILTIAZEM HCL-DEXTROSE 125-5 MG/125ML-% IV SOLN (PREMIX)
5.0000 mg/h | INTRAVENOUS | Status: DC
  Administered 2020-05-31: 5 mg/h via INTRAVENOUS
  Filled 2020-05-31: qty 125

## 2020-05-31 MED ORDER — METOPROLOL TARTRATE 25 MG PO TABS
25.0000 mg | ORAL_TABLET | Freq: Two times a day (BID) | ORAL | Status: DC
  Administered 2020-05-31: 25 mg via ORAL
  Filled 2020-05-31: qty 1

## 2020-05-31 MED ORDER — METOPROLOL TARTRATE 25 MG PO TABS: 50.0000 mg | ORAL_TABLET | Freq: Two times a day (BID) | ORAL | Status: DC

## 2020-05-31 MED ORDER — METOPROLOL TARTRATE 50 MG PO TABS
50.0000 mg | ORAL_TABLET | Freq: Two times a day (BID) | ORAL | Status: DC
  Administered 2020-05-31: 50 mg via ORAL
  Filled 2020-05-31: qty 1

## 2020-05-31 MED ORDER — METOPROLOL TARTRATE 5 MG/5ML IV SOLN
2.5000 mg | INTRAVENOUS | Status: DC | PRN
  Administered 2020-05-31 (×2): 2.5 mg via INTRAVENOUS
  Filled 2020-05-31: qty 5

## 2020-05-31 MED ORDER — DILTIAZEM LOAD VIA INFUSION
15.0000 mg | Freq: Once | INTRAVENOUS | Status: AC
  Administered 2020-05-31: 15 mg via INTRAVENOUS
  Filled 2020-05-31: qty 15

## 2020-05-31 NOTE — Consult Note (Signed)
Cardiology Consultation:   Patient ID: Ronald Lowery MRN: [id number]; DOB: 08/24/1941  Admit date: 05/30/2020 Date of Consult: 05/31/2020  Primary Care Provider: Burnard Bunting, MD [facility] Cardiologist: Buford Dresser, MD  [place] Electrophysiologist:  None    Patient Profile:   Ronald Lowery is a 79 y.o. male who is being seen today for the evaluation of AFIB at the request of Dr. Maren Beach.  History of Present Illness:   [name] is a 79 year old male here with paroxysmal atrial fibrillation rapid ventricular response originally diagnosed back in May 2021 where he converted to sinus rhythm after a few hours of atrial fibrillation.    Back in June 2021 we had seen the patient, normal EKG at that time.  On 04/05/2020 he was found to be in atrial flutter during echocardiogram with low normal EF.  Had mildly dilated aortic root.  At that time was not on anticoagulation.  He presented to the ER with weakness possible urinary tract infection and was noted to be in A. fib RVR in route via EMS.  His temperature was 100.3 with A. fib rates noted up to 140 and IV diltiazem helped to regulate.  Covid test was negative.  Chest x-ray unremarkable.  Troponin was elevated at 116.  No ischemic findings noted on ECG.  IV ceftriaxone started.  Currently looks tired but states he feels well. He does not feel any palpitations currently. No chest pain no syncope. He has been walking at home approximately 30 minutes in the mornings. Recently this has gotten harder because of the heat he thinks.  Past Medical History:  Diagnosis Date  . Anxiety   . Arthritis   . Basal cell carcinoma 2004   x1-face  . Cataract    bilateral-may be removed 05/2015  . Complication of anesthesia    "took a while to wake up with triple hernia repair"  . DDD (degenerative disc disease), lumbar    L5-steroid injection 01/2015  . Depression   . Environmental allergies   . GERD (gastroesophageal  reflux disease)   . Hematuria   . History of bladder cancer   . History of hiatal hernia   . History of prostate cancer   . History of skin cancer   . Hyperlipidemia   . Hypertension   . Hypothyroidism   . Nocturia   . Numbness    4TH / 5TH FINGER TIPS  . Prostate cancer ([place])   . Scoliosis   . Squamous carcinoma 2008   x2- neck, face  . Urgency of urination   . Varicose veins     Past Surgical History:  Procedure Laterality Date  . APPENDECTOMY  1955  . CATARACT EXTRACTION    . CYSTOSCOPY N/A 12/19/2015   Procedure: CYSTOSCOPY;  Surgeon: Raynelle Bring, MD;  Location: [facility];  Service: Urology;  Laterality: N/A;  . CYSTOSCOPY W/ RETROGRADES Bilateral 05/09/2015   Procedure: CYSTOSCOPY WITH LEFT URETERAL CANNULATION;  Surgeon: Raynelle Bring, MD;  Location: [facility];  Service: Urology;  Laterality: Bilateral;  . CYSTOSCOPY W/ RETROGRADES Bilateral 05/04/2016   Procedure: CYSTOSCOPY WITH RETROGRADE PYELOGRAM;  Surgeon: Raynelle Bring, MD;  Location: [facility];  Service: Urology;  Laterality: Bilateral;  . ESOPHAGOGASTRODUODENOSCOPY (EGD) WITH ESOPHAGEAL DILATION  2004  . EYE SURGERY     cataracts with lens implants bilateral  . FOOT SURGERY  1980's   Morton's neuroma x3  . HERNIA REPAIR     triple laproscopic  . LYMPHADENECTOMY Bilateral 06/28/2016   Procedure:  PELVIC LYMPHADENECTOMY;  Surgeon: Raynelle Bring, MD;  Location: [facility];  Service: Urology;  Laterality: Bilateral;  . MASS BIOPSY     bladder tumor biopsy  . PROSTATECTOMY  2010  . ROBOT ASSISTED LAPAROSCOPIC COMPLETE CYSTECT ILEAL CONDUIT N/A 06/28/2016   Procedure: XI ROBOTIC ASSISTED LAPAROSCOPIC COMPLETE CYSTECT ILEAL CONDUIT;  Surgeon: Raynelle Bring, MD;  Location: [facility];  Service: Urology;  Laterality: N/A;  . SKIN CANCER DESTRUCTION  2004   basal cell carcinoma  . TONSILLECTOMY  ~1949  . TOTAL HIP ARTHROPLASTY Left 08/16/2015   Procedure: LEFT TOTAL HIP ARTHROPLASTY ANTERIOR APPROACH;  Surgeon: Paralee Cancel, MD;   Location: [facility];  Service: Orthopedics;  Laterality: Left;  . TRANSURETHRAL RESECTION OF BLADDER TUMOR N/A 05/09/2015   Procedure: TRANSURETHRAL RESECTION OF BLADDER TUMOR (TURBT);  Surgeon: Raynelle Bring, MD;  Location[facility];  Service: Urology;  Laterality: N/A;  . TRANSURETHRAL RESECTION OF BLADDER TUMOR N/A 11/14/2015   Procedure: TRANSURETHRAL RESECTION OF BLADDER TUMOR (TURBT);  Surgeon: Raynelle Bring, MD;  Location[facility];  Service: Urology;  Laterality: N/A;  . TRANSURETHRAL RESECTION OF BLADDER TUMOR N/A 12/19/2015   Procedure: TRANSURETHRAL RESECTION OF BLADDER TUMOR (TURBT);  Surgeon: Raynelle Bring, MD;  Location: [facility];  Service: Urology;  Laterality: N/A;  . TRANSURETHRAL RESECTION OF BLADDER TUMOR N/A 05/04/2016   Procedure: TRANSURETHRAL RESECTION OF BLADDER TUMOR (TURBT);  Surgeon: Raynelle Bring, MD;  Location: [facility];  Service: Urology;  Laterality: N/A;  . VEIN SURGERY Left 2011   leg     Home Medications:  Prior to Admission medications   Medication Sig Start Date End Date Taking? Authorizing Provider  gabapentin (NEURONTIN) 100 MG capsule Take 200 mg by mouth at bedtime.    Yes [provider]  levothyroxine (SYNTHROID) 100 MCG tablet Take 100 mcg by mouth daily before breakfast.  01/21/20  Yes [provider]  zolpidem (AMBIEN) 10 MG tablet Take 5 mg by mouth at bedtime.    Yes [provider]    Inpatient Medications: Scheduled Meds: . gabapentin  200 mg Oral QHS  . metoprolol tartrate  25 mg Oral BID  . zolpidem  5 mg Oral QHS   Continuous Infusions: . cefTRIAXone (ROCEPHIN)  IV    . heparin 1,100 Units/hr (05/30/20 2201)   PRN Meds: acetaminophen **OR** acetaminophen, metoprolol tartrate, ondansetron **OR** ondansetron (ZOFRAN) IV  Allergies:    Allergies  Allergen Reactions  . Cefdinir Other (See Comments)    Muscle pain  . Myrbetriq [Mirabegron] Itching    Social History:   Social History   Socioeconomic History  .  Marital status: Married    Spouse name: Not on file  . Number of children: Not on file  . Years of education: Not on file  . Highest education level: Not on file  Occupational History  . Not on file  Tobacco Use  . Smoking status: Never Smoker  . Smokeless tobacco: Never Used  Substance and Sexual Activity  . Alcohol use: No  . Drug use: No  . Sexual activity: Not on file  Other Topics Concern  . Not on file  Social History Narrative  . Not on file   Social Determinants of Health   Financial Resource Strain:   . Difficulty of Paying Living Expenses:   Food Insecurity:   . Worried About Charity fundraiser in the Last Year:   . Arboriculturist in the Last Year:   Transportation Needs:   . Lack  of Transportation (Medical):   Marland Kitchen Lack of Transportation (Non-Medical):   Physical Activity:   . Days of Exercise per Week:   . Minutes of Exercise per Session:   Stress:   . Feeling of Stress :   Social Connections:   . Frequency of Communication with Friends and Family:   . Frequency of Social Gatherings with Friends and Family:   . Attends Religious Services:   . Active Member of Clubs or Organizations:   . Attends Archivist Meetings:   Marland Kitchen Marital Status:   Intimate Partner Violence:   . Fear of Current or Ex-Partner:   . Emotionally Abused:   Marland Kitchen Physically Abused:   . Sexually Abused:     Family History:    Family History  Problem Relation Age of Onset  . Colon cancer Maternal Aunt   . Esophageal cancer Neg Hx   . Stomach cancer Neg Hx   . Rectal cancer Neg Hx      ROS:  Please see the history of present illness.  Positive for fevers weakness palpitations All other ROS reviewed and negative.     Physical Exam/Data:   Vitals:   05/31/20 0700 05/31/20 0704 05/31/20 0800 05/31/20 0900  BP: 134/75 134/75 (!) 148/91 130/76  Pulse: 71 68 90 90  Resp: 11 13 16  (!) 23  Temp:      TempSrc:      SpO2: 98% 98% 99% 99%  Height:        Intake/Output  Summary (Last 24 hours) at 05/31/2020 1107 Last data filed at 05/31/2020 0139 Gross per 24 hour  Intake 2100 ml  Output 800 ml  Net 1300 ml   Last 3 Weights 03/16/2020 07/07/2016 06/28/2016  Weight (lbs) 179 lb 3.2 oz 180 lb 12.4 oz 198 lb 13.7 oz  Weight (kg) 81.285 kg 82 kg 90.2 kg     Body mass index is 22.1 kg/m.  General:  Well nourished, well developed, in no acute distress HEENT: normal Lymph: no adenopathy Neck: no JVD Endocrine:  No thryomegaly Vascular: No carotid bruits; FA pulses 2+ bilaterally without bruits  Cardiac:  normal S1, S2; irregularly irregular normal heart rate; no murmur  Lungs:  clear to auscultation bilaterally, no wheezing, rhonchi or rales  Abd: soft, nontender, no hepatomegaly  Ext: no edema Musculoskeletal:  No deformities, BUE and BLE strength normal and equal Skin: warm and dry  Neuro:  CNs 2-12 intact, no focal abnormalities noted Psych:  Normal affect   EKG:  The EKG was personally reviewed and demonstrates: 05/30/2020 at 1711 atrial flutter/coarse atrial fibrillation 146 bpm.  Repeat EKG on 05/30/2020 at [place] shows atrial fibrillation heart rate 87 bpm variable conduction.  Telemetry:  Telemetry was personally reviewed and demonstrates: Atrial fibrillation  Relevant CV Studies:  Echocardiogram 04/05/2020:  1. Pt appears to be in atrial flutter during the study; low normal LV  function; mildly dilated aortic root; moderate RAE; mild RVE.  2. Left ventricular ejection fraction, by estimation, is 50 to 55%. The  left ventricle has low normal function. The left ventricle has no regional  wall motion abnormalities. Left ventricular diastolic parameters are  indeterminate.  3. Right ventricular systolic function is normal. The right ventricular  size is mildly enlarged. Tricuspid regurgitation signal is inadequate for  assessing PA pressure.  4. Right atrial size was moderately dilated.  5. The mitral valve is normal in structure. Trivial  mitral valve  regurgitation. No evidence of mitral stenosis.  6. The  aortic valve is tricuspid. Aortic valve regurgitation is not  visualized. Mild aortic valve sclerosis is present, with no evidence of  aortic valve stenosis.  7. There is mild dilatation of the aortic root measuring 40 mm.  8. The inferior vena cava is dilated in size with >50% respiratory  variability, suggesting right atrial pressure of 8 mmHg.   Laboratory Data:  High Sensitivity Troponin:   Recent Labs  Lab 05/30/20 1702 05/30/20 1935 05/30/20 2158 05/31/20 0010  TROPONINIHS 116* 123* 110* 117*     Chemistry Recent Labs  Lab 05/30/20 1702 05/31/20 0347  NA 134* 132*  K 3.9 3.7  CL 96* 97*  CO2 24 24  GLUCOSE 95 105*  BUN 8 8  CREATININE 1.01 0.88  CALCIUM 9.1 8.3*  GFRNONAA >60 >60  GFRAA >60 >60  ANIONGAP 14 11    No results for input(s): PROT, ALBUMIN, AST, ALT, ALKPHOS, BILITOT in the last 168 hours. Hematology Recent Labs  Lab 05/30/20 1702 05/31/20 0347  WBC 13.7* 9.1  RBC 4.64 4.14*  HGB 14.3 12.6*  HCT 44.0 38.0*  MCV 94.8 91.8  MCH 30.8 30.4  MCHC 32.5 33.2  RDW 12.7 12.7  PLT 221 209   BNP Recent Labs  Lab 05/30/20 1702  BNP 392.5*    DDimer No results for input(s): DDIMER in the last 168 hours.   Radiology/Studies:  DG Chest Portable 1 View  Result Date: 05/30/2020 CLINICAL DATA:  Atrial fibrillation EXAM: PORTABLE CHEST 1 VIEW COMPARISON:  03/10/2020 chest radiograph. FINDINGS: Stable cardiomediastinal silhouette with normal heart size. No pneumothorax. No pleural effusion. No pulmonary edema. Mild platelike lateral left basilar scarring versus atelectasis. No acute consolidative airspace disease. IMPRESSION: Mild platelike lateral left basilar scarring versus atelectasis. Otherwise no active disease. Electronically Signed   By: Ilona Sorrel M.D.   On: 05/30/2020 17:57   CT Renal Stone Study  Result Date: 05/30/2020 CLINICAL DATA:  Hematuria.  History of prostate  cancer. EXAM: CT ABDOMEN AND PELVIS WITHOUT CONTRAST TECHNIQUE: Multidetector CT imaging of the abdomen and pelvis was performed following the standard protocol without IV contrast. COMPARISON:  CT AP 12/21/2016 FINDINGS: Lower chest: No acute abnormality. Hepatobiliary: 7 mm low-attenuation structure along the dome of liver is unchanged and remains too small to characterize but favored to represent a small cyst. Gallbladder appears normal. No biliary dilatation. Pancreas: Unremarkable. No pancreatic ductal dilatation or surrounding inflammatory changes. Spleen: Normal in size without focal abnormality. Adrenals/Urinary Tract: Normal appearance of the adrenal glands. No kidney stones identified bilaterally. No hydronephrosis. Cystectomy with right lower quadrant ileal conduit. Stomach/Bowel: Stomach is within normal limits. Appendix appears normal. No evidence of bowel wall thickening, distention, or inflammatory changes. Vascular/Lymphatic: Aortic atherosclerosis. No aneurysm. No abdominal or pelvic adenopathy. Reproductive: Prostatectomy. Other: No free fluid or fluid collections identified. Musculoskeletal: Scoliosis and multilevel degenerative disc disease identified within the lumbar spine. No acute or suspicious osseous abnormalities. Previous left hip arthroplasty. IMPRESSION: 1. No acute findings within the abdomen or pelvis. No findings to explain patient's hematuria. 2. Status post cystoprostatectomy with right lower quadrant ileal conduit. 3. No evidence for metastatic disease. 4. Aortic atherosclerosis. Aortic Atherosclerosis (ICD10-I70.0). Electronically Signed   By: Kerby Moors M.D.   On: 05/30/2020 20:34   {   Assessment and Plan:   Atrial fibrillation with rapid ventricular response, paroxysmal -Agree with anticoagulation.  Currently on IV heparin. I will transition him over to Eliquis 5 mg twice a day.  Chads vasc score is at  least 3 given age greater than 26 and  hypertension. -Underlying mild fever, possible UTI, followed by Dr. Alinda Money in urology may be precipitating these episodes of atrial fibrillation. -Fairly well rate controlled currently. I will increase his metoprolol tartrate to 50 mg twice a day. -There is a chance that he will auto convert. No urgent need for cardioversion at this time.  Chronic anticoagulation -will start Eliquis 5 mg twice a day. Stop IV heparin -I explained to him the risks and benefits of anticoagulation. Explained to him the physiology of potential thrombus formation within the atria causing stroke.  Elevated troponin -Compatible with atrial fibrillation rapid ventricular response/demand ischemia.  No evidence of acute coronary syndrome currently.  Flat.  Aortic atherosclerosis -Seen on CT scan.  Recommend statin therapy.  Weakness -Interestingly, this seems to be an issue that he has dealt with for quite some time.  Dr. Harrell Gave mentions that in her note back in June 2021.  Hypothyroidism -TSH 0.11 which is low, free T4 1.54 which is mildly elevated.  Certainly can precipitate atrial fibrillation as well.  Per primary team. I like the utilization of metoprolol in the setting.      For questions or updates, please contact [place] Please consult [email or web address] for contact info under    Signed, Candee Furbish, MD  05/31/2020 11:08 AM

## 2020-05-31 NOTE — ED Notes (Signed)
Pt moved to hospital bed, after moving pt states he was more comfortable on ER stretcher and requested to be moved back to the stretcher.

## 2020-05-31 NOTE — ED Notes (Signed)
Sheran Luz DO messaged regarding HR afib rvr ranging from 110-130's. Provider ordered PRN metoprolol and will increase PO dose in morning. Will continue to monitor BP.

## 2020-05-31 NOTE — Progress Notes (Addendum)
Pt back in A.Fib RVR with rate 110-130s.  Guessing the 12.5mg  PO lopressor wasn't enough.  BP 147/86.  Will put in for IV lopressor 2.5mg  Q74min PRN for rate control, hold if hypotensive.  Max dose of 15mg .  Will write to increase his PO lopressor to 25mg  PO BID starting with the next dose at 10am.

## 2020-05-31 NOTE — Progress Notes (Signed)
ANTICOAGULATION CONSULT NOTE - Initial Consult  Pharmacy Consult for IV heparin Indication: atrial fibrillation  Allergies  Allergen Reactions  . Cefdinir Other (See Comments)    Muscle pain  . Myrbetriq [Mirabegron] Itching    Patient Measurements: Height: 6' 3.5" (191.8 cm) IBW/kg (Calculated) : 85.65 Heparin Dosing Weight: TBW  Vital Signs: Temp: 98.4 F (36.9 C) (08/17 0350) BP: 112/81 (08/17 0500) Pulse Rate: 97 (08/17 0500)  Labs: Recent Labs    05/30/20 1702 05/30/20 1702 05/30/20 1935 05/30/20 2158 05/31/20 0010 05/31/20 0347 05/31/20 0549  HGB 14.3  --   --   --   --  12.6*  --   HCT 44.0  --   --   --   --  38.0*  --   PLT 221  --   --   --   --  209  --   LABPROT  --   --   --   --   --  14.5  --   INR  --   --   --   --   --  1.2  --   HEPARINUNFRC  --   --   --   --   --   --  0.64  CREATININE 1.01  --   --   --   --  0.88  --   TROPONINIHS 116*   < > 123* 110* 117*  --   --    < > = values in this interval not displayed.    CrCl cannot be calculated (Unknown ideal weight.).   Medical History: Past Medical History:  Diagnosis Date  . Anxiety   . Arthritis   . Basal cell carcinoma 2004   x1-face  . Cataract    bilateral-may be removed 05/2015  . Complication of anesthesia    "took a while to wake up with triple hernia repair"  . DDD (degenerative disc disease), lumbar    L5-steroid injection 01/2015  . Depression   . Environmental allergies   . GERD (gastroesophageal reflux disease)   . Hematuria   . History of bladder cancer   . History of hiatal hernia   . History of prostate cancer   . History of skin cancer   . Hyperlipidemia   . Hypertension   . Hypothyroidism   . Nocturia   . Numbness    4TH / 5TH FINGER TIPS  . Prostate cancer ([place])   . Scoliosis   . Squamous carcinoma 2008   x2- neck, face  . Urgency of urination   . Varicose veins     Medications:  (Not in a hospital admission)  Scheduled:  . gabapentin  200 mg  Oral QHS  . metoprolol tartrate  25 mg Oral BID  . zolpidem  5 mg Oral QHS   Infusions:  . cefTRIAXone (ROCEPHIN)  IV    . heparin 1,100 Units/hr (05/30/20 2201)   PRN: acetaminophen **OR** acetaminophen, metoprolol tartrate, ondansetron **OR** ondansetron (ZOFRAN) IV   Assessment: 29 yoM with PMH HTN, HLD, bladder/prostate CA, presents with fatigue; found to be in Afib. First episode in May 2021; has never been placed on anticoagulation   Baseline INR, aPTT: not done  Prior anticoagulation: none  Significant events:  Today, 05/31/2020:  Heparin level = 0.64 (therapeutic) with heparin infusing @ 1100 units/hr  CBC: Hgb 12.6; PLTC 209  SCr at baseline  No bleeding or infusion issues noted  Goal of Therapy: Heparin level 0.3-0.7 units/ml Monitor platelets by  anticoagulation protocol: Yes  Plan:  Continue Heparin 1100 units/hr IV infusion  Recheck heparin level in 8 hrs to confirm therapeutic dose  Daily CBC, daily heparin level once stable  Monitor for signs of bleeding or thrombosis  Leone Haven, PharmD 05/31/2020, 6:20 AM

## 2020-05-31 NOTE — Progress Notes (Signed)
PROGRESS NOTE    Ronald Lowery  PJK:[id number] DOB: [birth year] DOA: 05/30/2020 PCP: Burnard Bunting, MD   Chief Complaint  Patient presents with  . Fatigue  . Atrial Fibrillation   Brief Narrative: 79 year old male with hypertension, hyperlipidemia bladder and prostate cancer status post cystectomy and ileal conduit comes to the ED for evaluation of malaise weakness and rapid A. Fib.  He was diagnosed with A. fib back in May 2021.Converted to NSR on way to ER from PCP office.  Cards saw pt on 03/16/20 nl EKG at that time, patient then had echocardiogram performed 04/05/2020 where he was found to be in atrial flutter with low normal LV EF, moderate RAE, and mildly dilated aortic root.  Not on anticoagulation at this time. In the ED leukocytosis low-grade temperature 103 A. fib with RVR in the 120s to 140s after Cardizem her rate slowed down but subsequently back into rapid A. fib and metoprolol dose was increased.  Covid negative chest x-ray negative patient ceftriaxone and bolus fluids, troponin I 116.  Urine analysis with bacteria no wbc  trace LE moderate hemoglobin he was admitted for further management  Subjective:  c/o lower back pain, reports he is on the bed al night and not  Hurting. Able to move all his legs. Patient otherwise denies any nausea, vomiting, chest pain, shortness of breath, fever, chills, headache, focal weakness, numbness tingling, speech difficulties   Assessment & Plan:  Sepsis POA suspected to be from urinary source in the setting of ileal conduit.  Leukocytosis resolved.  Blood pressure stable.  CT stone yesterday no acute finding in the abdomen or pelvis. He is afebrile.  Continue ceftriaxone follow-up culture data.  Atrial fibrillation with RVR : Rate relatively stable on current medical 25 twice daily, he has been started on anticoagulant heparin drip.  Will consult cardiology to look into rate control versus rhythm control strategy.   Elevated troponin suspect  demand ischemia with A. fib sepsis troponin relatively stable flat though elevated, EKG no acute ischemic changes.  Requesting cardiology input.  Hypothyroidism with low TSH and free T4 elevated suspect overcorrection with home Synthroid, on 100 MCG daily and will hold for now and will  need to cut down the dose and follow-up TSH as outpatient.  Generalized weakness/debility has been dealing with this for some time.Obtain PT OT eval.  DVT prophylaxis: Heparin Code Status:   Code Status: Full Code  Family Communication: plan of care discussed with patient at bedside.  Status is: Admitted as observation  Patient continues remains hospitalized for ongoing management of his A. fib with RVR, cardiology eval and management of his sepsis.  Dispo: The patient is from: Home              Anticipated d/c is to: Home              Anticipated d/c date is: 1 day              Patient currently is not medically stable to d/c. Nutrition: Diet Order            Diet Heart Room service appropriate? Yes; Fluid consistency: Thin  Diet effective now                   Body mass index is 22.1 kg/m.  Consultants:see note  Procedures:see note Microbiology:see note Blood Culture    Component Value Date/Time   SDES  05/30/2020 1935    BLOOD LEFT FOREARM Performed at [facility]  [place] [address], [place], [place] [address]    [place]  05/30/2020 1935    BOTTLES DRAWN AEROBIC AND ANAEROBIC Blood Culture adequate volume Performed at [facility] [address] [place], [place] [address]    CULT  05/30/2020 1935    NO GROWTH < 12 HOURS Performed at [facility], [place] [address], [place], [place] [address]    REPTSTATUS PENDING 05/30/2020 1935    Other culture-see note  Medications: Scheduled Meds: . apixaban  5 mg Oral BID  . gabapentin  200 mg Oral QHS  . metoprolol tartrate  50 mg Oral BID  . zolpidem  5 mg Oral QHS   Continuous  Infusions: . cefTRIAXone (ROCEPHIN)  IV      Antimicrobials: Anti-infectives (From admission, onward)   Start     Dose/Rate Route Frequency Ordered Stop   05/31/20 2000  cefTRIAXone (ROCEPHIN) 1 g in sodium chloride 0.9 % 100 mL IVPB     Discontinue     1 g 200 mL/hr over 30 Minutes Intravenous Every 24 hours 05/30/20 2034     05/30/20 1845  cefTRIAXone (ROCEPHIN) 1 g in sodium chloride 0.9 % 100 mL IVPB  Status:  Discontinued        1 g 200 mL/hr over 30 Minutes Intravenous Every 24 hours 05/30/20 1837 05/30/20 2037       Objective: Vitals: Today's Vitals   05/31/20 1000 05/31/20 1100 05/31/20 1115 05/31/20 1201  BP: (!) 120/100 135/80  (!) 120/101  Pulse: 98 (!) 47 86 94  Resp: (!) 21 (!) 21 (!) 24 20  Temp:      TempSrc:      SpO2: 99% 100% 100% 95%  Height:      PainSc:        Intake/Output Summary (Last 24 hours) at 05/31/2020 1333 Last data filed at 05/31/2020 0139 Gross per 24 hour  Intake 2100 ml  Output 800 ml  Net 1300 ml   There were no vitals filed for this visit. Weight change:    Intake/Output from previous day: 08/16 0701 - 08/17 0700 In: 2100 [IV [id number]:2633] Out: 800 [Urine:800] Intake/Output this shift: No intake/output data recorded.  Examination:  General exam: AAOx3, waking up,NAD, weak appearing. HEENT:Oral mucosa moist, Ear/Nose WNL grossly,dentition normal. Respiratory system: bilaterally clear, no wheezing or crackles,no use of accessory muscle, non tender. Cardiovascular system: S1 & S2 +, irregular, HR in 110s, No JVD. Gastrointestinal system: Abdomen soft, NT,ND, BS+. Nervous System:Alert, awake, moving extremities and grossly nonfocal Extremities: No edema, distal peripheral pulses palpable.  Skin: No rashes,no icterus. MSK: Normal muscle bulk,tone, power Ileostomy+ with clear urine  Data Reviewed: I have personally reviewed following labs and imaging studies CBC: Recent Labs  Lab 05/30/20 1702 05/31/20 0347  WBC 13.7*  9.1  HGB 14.3 12.6*  HCT 44.0 38.0*  MCV 94.8 91.8  PLT 221 354   Basic Metabolic Panel: Recent Labs  Lab 05/30/20 1702 05/30/20 1935 05/31/20 0347  NA 134*  --  132*  K 3.9  --  3.7  CL 96*  --  97*  CO2 24  --  24  GLUCOSE 95  --  105*  BUN 8  --  8  CREATININE 1.01  --  0.88  CALCIUM 9.1  --  8.3*  MG  --  1.8  --    GFR: CrCl cannot be calculated (Unknown ideal weight.). Liver Function Tests: No results for input(s): AST, ALT, ALKPHOS,  BILITOT, PROT, ALBUMIN in the last 168 hours. No results for input(s): LIPASE, AMYLASE in the last 168 hours. No results for input(s): AMMONIA in the last 168 hours. Coagulation Profile: Recent Labs  Lab 05/31/20 0347  INR 1.2   Cardiac Enzymes: No results for input(s): CKTOTAL, CKMB, CKMBINDEX, TROPONINI in the last 168 hours. BNP (last 3 results) No results for input(s): PROBNP in the last 8760 hours. HbA1C: No results for input(s): HGBA1C in the last 72 hours. CBG: Recent Labs  Lab 05/30/20 1834  GLUCAP 82   Lipid Profile: No results for input(s): CHOL, HDL, LDLCALC, TRIG, CHOLHDL, LDLDIRECT in the last 72 hours. Thyroid Function Tests: Recent Labs    05/30/20 1935 05/30/20 2158  TSH 0.110*  --   FREET4  --  1.54*   Anemia Panel: No results for input(s): VITAMINB12, FOLATE, FERRITIN, TIBC, IRON, RETICCTPCT in the last 72 hours. Sepsis Labs: Recent Labs  Lab 05/30/20 1702 05/31/20 0347  PROCALCITON  --  <0.10  LATICACIDVEN 2.4*  --     Recent Results (from the past 240 hour(s))  SARS Coronavirus 2 by RT PCR (hospital order, performed in [facility] hospital lab) Nasopharyngeal Nasopharyngeal Swab     Status: None   Collection Time: 05/30/20  5:21 PM   Specimen: Nasopharyngeal Swab  Result Value Ref Range Status   SARS Coronavirus 2 NEGATIVE NEGATIVE Final    Comment: (NOTE) SARS-CoV-2 target nucleic acids are NOT DETECTED.  The SARS-CoV-2 RNA is generally detectable in upper and lower respiratory  specimens during the acute phase of infection. The lowest concentration of SARS-CoV-2 viral copies this assay can detect is 250 copies / mL. A negative result does not preclude SARS-CoV-2 infection and should not be used as the sole basis for treatment or other patient management decisions.  A negative result may occur with improper specimen collection / handling, submission of specimen other than nasopharyngeal swab, presence of viral mutation(s) within the areas targeted by this assay, and inadequate number of viral copies (<250 copies / mL). A negative result must be combined with clinical observations, patient history, and epidemiological information.  Fact Sheet for Patients:   StrictlyIdeas.no  Fact Sheet for Healthcare Providers: BankingDealers.co.za  This test is not yet approved or  cleared by the [place] FDA and has been authorized for detection and/or diagnosis of SARS-CoV-2 by FDA under an Emergency Use Authorization (EUA).  This EUA will remain in effect (meaning this test can be used) for the duration of the COVID-19 declaration under Section 564(b)(1) of the Act, 21 U.S.C. section 360bbb-3(b)(1), unless the authorization is terminated or revoked sooner.  Performed at [facility], [place] [address], [place], [place] [address]   Blood culture (routine x 2)     Status: None (Preliminary result)   Collection Time: 05/30/20  5:21 PM   Specimen: Site Not Specified; Blood  Result Value Ref Range Status   Specimen Description   Final    SITE NOT SPECIFIED Performed at [facility] [address], [place], [place] [address]    Special Requests   Final    BOTTLES DRAWN AEROBIC AND ANAEROBIC Blood Culture results may not be optimal due to an inadequate volume of blood received in culture bottles Performed at [facility] [address] [place], [place] [address]     Culture   Final    NO GROWTH < 12 HOURS Performed at [facility] [address] [place], [place]  [address]    Report Status PENDING  Incomplete  Blood culture (routine x 2)     Status: None (Preliminary result)   Collection Time: 05/30/20  7:35 PM   Specimen: BLOOD LEFT FOREARM  Result Value Ref Range Status   Specimen Description   Final    BLOOD LEFT FOREARM Performed at [facility] [address], [place], [place] [address]    Special Requests   Final    BOTTLES DRAWN AEROBIC AND ANAEROBIC Blood Culture adequate volume Performed at [facility] [address] [place], [place] [address]    Culture   Final    NO GROWTH < 12 HOURS Performed at [facility] [address], [place], [place] [address]    Report Status PENDING  Incomplete      Radiology Studies: DG Chest Portable 1 View  Result Date: 05/30/2020 CLINICAL DATA:  Atrial fibrillation EXAM: PORTABLE CHEST 1 VIEW COMPARISON:  03/10/2020 chest radiograph. FINDINGS: Stable cardiomediastinal silhouette with normal heart size. No pneumothorax. No pleural effusion. No pulmonary edema. Mild platelike lateral left basilar scarring versus atelectasis. No acute consolidative airspace disease. IMPRESSION: Mild platelike lateral left basilar scarring versus atelectasis. Otherwise no active disease. Electronically Signed   By: Ilona Sorrel M.D.   On: 05/30/2020 17:57   CT Renal Stone Study  Result Date: 05/30/2020 CLINICAL DATA:  Hematuria.  History of prostate cancer. EXAM: CT ABDOMEN AND PELVIS WITHOUT CONTRAST TECHNIQUE: Multidetector CT imaging of the abdomen and pelvis was performed following the standard protocol without IV contrast. COMPARISON:  CT AP 12/21/2016 FINDINGS: Lower chest: No acute abnormality. Hepatobiliary: 7 mm low-attenuation structure along the dome of liver is unchanged and remains too small to characterize but favored to represent a small cyst. Gallbladder  appears normal. No biliary dilatation. Pancreas: Unremarkable. No pancreatic ductal dilatation or surrounding inflammatory changes. Spleen: Normal in size without focal abnormality. Adrenals/Urinary Tract: Normal appearance of the adrenal glands. No kidney stones identified bilaterally. No hydronephrosis. Cystectomy with right lower quadrant ileal conduit. Stomach/Bowel: Stomach is within normal limits. Appendix appears normal. No evidence of bowel wall thickening, distention, or inflammatory changes. Vascular/Lymphatic: Aortic atherosclerosis. No aneurysm. No abdominal or pelvic adenopathy. Reproductive: Prostatectomy. Other: No free fluid or fluid collections identified. Musculoskeletal: Scoliosis and multilevel degenerative disc disease identified within the lumbar spine. No acute or suspicious osseous abnormalities. Previous left hip arthroplasty. IMPRESSION: 1. No acute findings within the abdomen or pelvis. No findings to explain patient's hematuria. 2. Status post cystoprostatectomy with right lower quadrant ileal conduit. 3. No evidence for metastatic disease. 4. Aortic atherosclerosis. Aortic Atherosclerosis (ICD10-I70.0). Electronically Signed   By: Kerby Moors M.D.   On: 05/30/2020 20:34     LOS: 0 days   Antonieta Pert, MD [facility]  05/31/2020, 1:33 PM

## 2020-06-01 LAB — CBC
HCT: 38.3 % — ABNORMAL LOW (ref 39.0–52.0)
Hemoglobin: 12.7 g/dL — ABNORMAL LOW (ref 13.0–17.0)
MCH: 30.8 pg (ref 26.0–34.0)
MCHC: 33.2 g/dL (ref 30.0–36.0)
MCV: 93 fL (ref 80.0–100.0)
Platelets: 203 10*3/uL (ref 150–400)
RBC: 4.12 MIL/uL — ABNORMAL LOW (ref 4.22–5.81)
RDW: 12.6 % (ref 11.5–15.5)
WBC: 10.6 10*3/uL — ABNORMAL HIGH (ref 4.0–10.5)
nRBC: 0 % (ref 0.0–0.2)

## 2020-06-01 LAB — BASIC METABOLIC PANEL
Anion gap: 8 (ref 5–15)
BUN: 13 mg/dL (ref 8–23)
CO2: 26 mmol/L (ref 22–32)
Calcium: 8.4 mg/dL — ABNORMAL LOW (ref 8.9–10.3)
Chloride: 98 mmol/L (ref 98–111)
Creatinine, Ser: 0.9 mg/dL (ref 0.61–1.24)
GFR calc Af Amer: >60 mL/min (ref >60)
GFR calc non Af Amer: >60 mL/min (ref >60)
Glucose, Bld: 91 mg/dL (ref 70–99)
Potassium: 4.5 mmol/L (ref 3.5–5.1)
Sodium: 132 mmol/L — ABNORMAL LOW (ref 135–145)

## 2020-06-01 MED ORDER — METOPROLOL TARTRATE 50 MG PO TABS
100.0000 mg | ORAL_TABLET | Freq: Two times a day (BID) | ORAL | Status: DC
  Administered 2020-06-01 – 2020-06-02 (×2): 100 mg via ORAL
  Filled 2020-06-01 (×2): qty 2

## 2020-06-01 MED ORDER — METOPROLOL TARTRATE 50 MG PO TABS
50.0000 mg | ORAL_TABLET | Freq: Once | ORAL | Status: AC
  Administered 2020-06-01: 50 mg via ORAL
  Filled 2020-06-01: qty 1

## 2020-06-01 NOTE — Discharge Instructions (Signed)

## 2020-06-01 NOTE — Progress Notes (Signed)
   05/31/20 1825  Assess: MEWS Score  Temp 99 F (37.2 C)  BP 118/77  Pulse Rate (!) 157  Resp (!) 27  SpO2 94 %  Assess: MEWS Score  MEWS Temp 0  MEWS Systolic 0  MEWS Pulse 3  MEWS RR 2  MEWS LOC 0  MEWS Score 5  MEWS Score Color Red  Assess: if the MEWS score is Yellow or Red  Were vital signs taken at a resting state? Yes  Focused Assessment No change from prior assessment  Early Detection of Sepsis Score *See Row Information* Low  MEWS guidelines implemented *See Row Information* Yes  Treat  MEWS Interventions Administered scheduled meds/treatments;Escalated (See documentation below)  Take Vital Signs  Increase Vital Sign Frequency  Red: Q 1hr X 4 then Q 4hr X 4, if remains red, continue Q 4hrs  Escalate  MEWS: Escalate Red: discuss with charge nurse/RN and provider, consider discussing with RRT  Notify: Charge Nurse/RN  Name of Charge Nurse/RN Notified Chancy Hurter, RN  Date Charge Nurse/RN Notified 05/31/20  Time Charge Nurse/RN Notified 1825  Notify: Provider  Provider Frances Maywood, MD  Date Provider Notified 05/31/20  Time Provider Notified [phone]  Notification Type Page  Notification Reason Change in status  Response See new orders  Date of Provider Response 05/31/20  Time of Provider Response 1825

## 2020-06-01 NOTE — Progress Notes (Signed)
PROGRESS NOTE    Ronald Lowery  BDZ:[id number] DOB: 1940/10/17 DOA: 05/30/2020 PCP: Burnard Bunting, MD   Chief Complaint  Patient presents with  . Fatigue  . Atrial Fibrillation   Brief Narrative: 79 year old male with hypertension, hyperlipidemia bladder and prostate cancer status post cystectomy and ileal conduit comes to the ED for evaluation of malaise weakness and rapid A. Fib.  He was diagnosed with A. fib back in May 2021.Converted to NSR on way to ER from PCP office.  Cards saw pt on 03/16/20 nl EKG at that time, patient then had echocardiogram performed 04/05/2020 where he was found to be in atrial flutter with low normal LV EF, moderate RAE, and mildly dilated aortic root.  Not on anticoagulation at this time. In the ED leukocytosis low-grade temperature 103 A. fib with RVR in the 120s to 140s after Cardizem her rate slowed down but subsequently back into rapid A. fib and metoprolol dose was increased.  Covid negative chest x-ray negative patient ceftriaxone and bolus fluids, troponin I 116.  Urine analysis with bacteria no wbc  trace LE moderate hemoglobin he was admitted for further management  Subjective: Seen this morning feels well.  Unaware of rapid heartbeat. He needed Cardizem bolus 15 mg iv bolus and drip yesterday evening till midnight for heart rate up to 150s This morning denies chest pain shortness of breath fever or chills.  Weakness has been getting better.  Assessment & Plan:  Sepsis POA suspected to be from urinary source in the setting of ileal conduit.  Leukocytosis resolved.  Blood pressure stable.  CT stone -no acute finding in the abdomen or pelvis.  Overall stable hemodynamically no fever he is afebrile.  Blood culture no growth, urine culture multiple species.  Will  finish short course x3 days  Atrial fibrillation with RVR: Off Cardizem drip during night.  Rate is still uncontrolled, increasing metoprolol 200 mg per cardiology appreciate input.  On Eliquis  for anticoagulation.  Continue to monitor in telemetry.  Elevated troponin suspect demand ischemia with A. fib sepsis troponin relatively stable flat though elevated, EKG no acute ischemic changes.  Appreciate cardiology inputs.  Hypothyroidism with low TSH and free T4 elevated suspect overcorrection with home Synthroid, on 100 MCG daily and will hold for now and will  need to cut down the dose and follow-up TSH as outpatient in 2 wks.  Generalized weakness/debility has been dealing with this for some time.pending PT OT evaluation.   DVT prophylaxis: Heparin Code Status:   Code Status: Full Code  Family Communication: plan of care discussed with patient at bedside.  Status is: Admitted as observation  Patient continues remains hospitalized for ongoing management of his A. fib with RVR, cardiology eval and management of his sepsis.  Dispo: The patient is from: Home              Anticipated d/c is to: Home w/ [place]              Anticipated d/c date is: 1 day              Patient currently is not medically stable to d/c. Nutrition: Diet Order            Diet Heart Room service appropriate? Yes; Fluid consistency: Thin  Diet effective now                   Body mass index is 21.99 kg/m.  Consultants:see note  Procedures:see note Microbiology:see note Blood  Culture    Component Value Date/Time   SDES  05/30/2020 1935    BLOOD LEFT FOREARM Performed at [facility], [place] [address], [place], [place] [address]    [place]  05/30/2020 1935    BOTTLES DRAWN AEROBIC AND ANAEROBIC Blood Culture adequate volume Performed at [facility] [address] [place], [place] [address]    CULT  05/30/2020 1935    NO GROWTH 1 DAY Performed at [facility], [place] [address] [place], [place] [address]    REPTSTATUS PENDING 05/30/2020 1935    Other culture-see note  Medications: Scheduled Meds: . apixaban  5 mg Oral BID  . gabapentin  200  mg Oral QHS  . metoprolol tartrate  100 mg Oral BID  . zolpidem  5 mg Oral QHS   Continuous Infusions: . cefTRIAXone (ROCEPHIN)  IV Stopped (06/01/20 0701)    Antimicrobials: Anti-infectives (From admission, onward)   Start     Dose/Rate Route Frequency Ordered Stop   05/31/20 2000  cefTRIAXone (ROCEPHIN) 1 g in sodium chloride 0.9 % 100 mL IVPB     Discontinue     1 g 200 mL/hr over 30 Minutes Intravenous Every 24 hours 05/30/20 2034     05/30/20 1845  cefTRIAXone (ROCEPHIN) 1 g in sodium chloride 0.9 % 100 mL IVPB  Status:  Discontinued        1 g 200 mL/hr over 30 Minutes Intravenous Every 24 hours 05/30/20 1837 05/30/20 2037       Objective: Vitals: Today's Vitals   06/01/20 0500 06/01/20 0923 06/01/20 1144 06/01/20 1147  BP: 108/84 124/77  110/79  Pulse:  96  (!) 115  Resp:  (!) 21    Temp: 98.2 F (36.8 C) 98.5 F (36.9 C)    TempSrc: Oral Oral    SpO2:  97%  100%  Weight:      Height:      PainSc:   0-No pain     Intake/Output Summary (Last 24 hours) at 06/01/2020 1200 Last data filed at 06/01/2020 0956 Gross per 24 hour  Intake 502.22 ml  Output --  Net 502.22 ml   Filed Weights   05/31/20 1817  Weight: 79.8 kg   Weight change:    Intake/Output from previous day: 08/17 0701 - 08/18 0700 In: 142.2 [I.V.:42.2; IV Piggyback:100] Out: -  Intake/Output this shift: Total I/O In: 360 [P.O.:360] Out: -   Examination:  General exam: AAOx3 , NAD, weak appearing. HEENT:Oral mucosa moist, Ear/Nose WNL grossly, dentition normal. Respiratory system: bilaterally clear,no wheezing or crackles,no use of accessory muscle Cardiovascular system: S1 & S2 +, irregularly irregular no JVD. Gastrointestinal system: Abdomen soft, NT,ND, BS+ Nervous System:Alert, awake, moving extremities and grossly nonfocal Extremities: No edema, distal peripheral pulses palpable.  Skin: No rashes,no icterus. MSK: Normal muscle bulk,tone, power  Data Reviewed: I have personally  reviewed following labs and imaging studies CBC: Recent Labs  Lab 05/30/20 1702 05/31/20 0347 06/01/20 0439  WBC 13.7* 9.1 10.6*  HGB 14.3 12.6* 12.7*  HCT 44.0 38.0* 38.3*  MCV 94.8 91.8 93.0  PLT 221 209 734   Basic Metabolic Panel: Recent Labs  Lab 05/30/20 1702 05/30/20 1935 05/31/20 0347 06/01/20 0439  NA 134*  --  132* 132*  K 3.9  --  3.7 4.5  CL 96*  --  97* 98  CO2 24  --  24 26  GLUCOSE 95  --  105* 91  BUN 8  --  8 13  CREATININE 1.01  --  0.88 0.90  CALCIUM 9.1  --  8.3* 8.4*  MG  --  1.8  --   --    GFR: Estimated Creatinine Clearance: 77.6 mL/min (by C-G formula based on SCr of 0.9 mg/dL). Liver Function Tests: No results for input(s): AST, ALT, ALKPHOS, BILITOT, PROT, ALBUMIN in the last 168 hours. No results for input(s): LIPASE, AMYLASE in the last 168 hours. No results for input(s): AMMONIA in the last 168 hours. Coagulation Profile: Recent Labs  Lab 05/31/20 0347  INR 1.2   Cardiac Enzymes: No results for input(s): CKTOTAL, CKMB, CKMBINDEX, TROPONINI in the last 168 hours. BNP (last 3 results) No results for input(s): PROBNP in the last 8760 hours. HbA1C: No results for input(s): HGBA1C in the last 72 hours. CBG: Recent Labs  Lab 05/30/20 1834  GLUCAP 82   Lipid Profile: No results for input(s): CHOL, HDL, LDLCALC, TRIG, CHOLHDL, LDLDIRECT in the last 72 hours. Thyroid Function Tests: Recent Labs    05/30/20 1935 05/30/20 2158  TSH 0.110*  --   FREET4  --  1.54*   Anemia Panel: No results for input(s): VITAMINB12, FOLATE, FERRITIN, TIBC, IRON, RETICCTPCT in the last 72 hours. Sepsis Labs: Recent Labs  Lab 05/30/20 1702 05/31/20 0347  PROCALCITON  --  <0.10  LATICACIDVEN 2.4*  --     Recent Results (from the past 240 hour(s))  SARS Coronavirus 2 by RT PCR (hospital order, performed in [facility] hospital lab) Nasopharyngeal Nasopharyngeal Swab     Status: None   Collection Time: 05/30/20  5:21 PM   Specimen:  Nasopharyngeal Swab  Result Value Ref Range Status   SARS Coronavirus 2 NEGATIVE NEGATIVE Final    Comment: (NOTE) SARS-CoV-2 target nucleic acids are NOT DETECTED.  The SARS-CoV-2 RNA is generally detectable in upper and lower respiratory specimens during the acute phase of infection. The lowest concentration of SARS-CoV-2 viral copies this assay can detect is 250 copies / mL. A negative result does not preclude SARS-CoV-2 infection and should not be used as the sole basis for treatment or other patient management decisions.  A negative result may occur with improper specimen collection / handling, submission of specimen other than nasopharyngeal swab, presence of viral mutation(s) within the areas targeted by this assay, and inadequate number of viral copies (<250 copies / mL). A negative result must be combined with clinical observations, patient history, and epidemiological information.  Fact Sheet for Patients:   StrictlyIdeas.no  Fact Sheet for Healthcare Providers: BankingDealers.co.za  This test is not yet approved or  cleared by the [place] FDA and has been authorized for detection and/or diagnosis of SARS-CoV-2 by FDA under an Emergency Use Authorization (EUA).  This EUA will remain in effect (meaning this test can be used) for the duration of the COVID-19 declaration under Section 564(b)(1) of the Act, 21 U.S.C. section 360bbb-3(b)(1), unless the authorization is terminated or revoked sooner.  Performed at [facility], [place] [address], [place], [place] [address]   Blood culture (routine x 2)     Status: None (Preliminary result)   Collection Time: 05/30/20  5:21 PM   Specimen: Site Not Specified; Blood  Result Value Ref Range Status   Specimen Description   Final    SITE NOT SPECIFIED Performed at [facility] [address] [place], [place] [address]    Special Requests    Final    BOTTLES DRAWN AEROBIC AND ANAEROBIC Blood  Culture results may not be optimal due to an inadequate volume of blood received in culture bottles Performed at [facility] [address] [place], [place] [address]    Culture   Final    NO GROWTH 2 DAYS Performed at [facility] [address] [place], [place] [address]    Report Status PENDING  Incomplete  Urine culture     Status: Abnormal   Collection Time: 05/30/20  5:21 PM   Specimen: Urine, Catheterized  Result Value Ref Range Status   Specimen Description   Final    URINE, CATHETERIZED Performed at [facility] [address], [place], [place] [address]    Special Requests   Final    NONE Performed at [facility], [place] [address], [place], [place] [address]    Culture MULTIPLE SPECIES PRESENT, SUGGEST RECOLLECTION (A)  Final   Report Status 05/31/2020 FINAL  Final  Blood culture (routine x 2)     Status: None (Preliminary result)   Collection Time: 05/30/20  7:35 PM   Specimen: BLOOD LEFT FOREARM  Result Value Ref Range Status   Specimen Description   Final    BLOOD LEFT FOREARM Performed at [facility] [address], [place], [place] [address]    Special Requests   Final    BOTTLES DRAWN AEROBIC AND ANAEROBIC Blood Culture adequate volume Performed at [facility] [address] [place], [place] [address]    Culture   Final    NO GROWTH 1 DAY Performed at [facility], [place] [address], [place], [place] [address]    Report Status PENDING  Incomplete      Radiology Studies: DG Chest Portable 1 View  Result Date: 05/30/2020 CLINICAL DATA:  Atrial fibrillation EXAM: PORTABLE CHEST 1 VIEW COMPARISON:  03/10/2020 chest radiograph. FINDINGS: Stable cardiomediastinal silhouette with normal heart size. No pneumothorax. No pleural effusion. No pulmonary edema. Mild platelike lateral left basilar scarring  versus atelectasis. No acute consolidative airspace disease. IMPRESSION: Mild platelike lateral left basilar scarring versus atelectasis. Otherwise no active disease. Electronically Signed   By: Ilona Sorrel M.D.   On: 05/30/2020 17:57   CT Renal Stone Study  Result Date: 05/30/2020 CLINICAL DATA:  Hematuria.  History of prostate cancer. EXAM: CT ABDOMEN AND PELVIS WITHOUT CONTRAST TECHNIQUE: Multidetector CT imaging of the abdomen and pelvis was performed following the standard protocol without IV contrast. COMPARISON:  CT AP 12/21/2016 FINDINGS: Lower chest: No acute abnormality. Hepatobiliary: 7 mm low-attenuation structure along the dome of liver is unchanged and remains too small to characterize but favored to represent a small cyst. Gallbladder appears normal. No biliary dilatation. Pancreas: Unremarkable. No pancreatic ductal dilatation or surrounding inflammatory changes. Spleen: Normal in size without focal abnormality. Adrenals/Urinary Tract: Normal appearance of the adrenal glands. No kidney stones identified bilaterally. No hydronephrosis. Cystectomy with right lower quadrant ileal conduit. Stomach/Bowel: Stomach is within normal limits. Appendix appears normal. No evidence of bowel wall thickening, distention, or inflammatory changes. Vascular/Lymphatic: Aortic atherosclerosis. No aneurysm. No abdominal or pelvic adenopathy. Reproductive: Prostatectomy. Other: No free fluid or fluid collections identified. Musculoskeletal: Scoliosis and multilevel degenerative disc disease identified within the lumbar spine. No acute or suspicious osseous abnormalities. Previous left hip arthroplasty. IMPRESSION: 1. No acute findings within the abdomen or pelvis. No findings to explain patient's hematuria. 2. Status post cystoprostatectomy with right lower quadrant ileal conduit. 3. No evidence for metastatic disease. 4. Aortic atherosclerosis.  Aortic Atherosclerosis (ICD10-I70.0). Electronically Signed   By: Kerby Moors M.D.   On: 05/30/2020 20:34     LOS: 1 day   Antonieta Pert, MD [facility]  06/01/2020, 12:00 PM

## 2020-06-01 NOTE — Progress Notes (Addendum)
Progress Note  Patient Name: Ronald Lowery Date of Encounter: 06/01/2020  [facility] Cardiologist: Buford Dresser, MD   Subjective   No chest pain and no SOB, unaware of rapid HR.  Off dilt now  Inpatient Medications    Scheduled Meds: . apixaban  5 mg Oral BID  . gabapentin  200 mg Oral QHS  . metoprolol tartrate  50 mg Oral BID  . zolpidem  5 mg Oral QHS   Continuous Infusions: . cefTRIAXone (ROCEPHIN)  IV Stopped (06/01/20 0701)  . diltiazem (CARDIZEM) infusion Stopped (06/01/20 0118)   PRN Meds: acetaminophen **OR** acetaminophen, metoprolol tartrate, ondansetron **OR** ondansetron (ZOFRAN) IV   Vital Signs    Vitals:   06/01/20 0200 06/01/20 0300 06/01/20 0400 06/01/20 0500  BP: 106/77 104/75 106/71 108/84  Pulse: 70 (!) 56 78   Resp: (!) 9 (!) 21 18   Temp:    98.2 F (36.8 C)  TempSrc:    Oral  SpO2: 99% 96% 94%   Weight:      Height:        Intake/Output Summary (Last 24 hours) at 06/01/2020 0835 Last data filed at 06/01/2020 0120 Gross per 24 hour  Intake 142.22 ml  Output --  Net 142.22 ml   Last 3 Weights 05/31/2020 03/16/2020 07/07/2016  Weight (lbs) 175 lb 14.4 oz 179 lb 3.2 oz 180 lb 12.4 oz  Weight (kg) 79.788 kg 81.285 kg 82 kg      Telemetry    A fib with rate to 120 today and down to 108 - Personally Reviewed  ECG    No new - Personally Reviewed  Physical Exam   GEN: No acute distress.   Neck: No JVD Cardiac: irreg irreg, no murmurs, rubs, or gallops.  Respiratory: Clear to auscultation bilaterally. GI: Soft, nontender, non-distended  MS: No edema to trace; No deformity. Neuro:  Nonfocal  Psych: Normal affect   Labs    High Sensitivity Troponin:   Recent Labs  Lab 05/30/20 1702 05/30/20 1935 05/30/20 2158 05/31/20 0010  TROPONINIHS 116* 123* 110* 117*      Chemistry Recent Labs  Lab 05/30/20 1702 05/31/20 0347 06/01/20 0439  NA 134* 132* 132*  K 3.9 3.7 4.5  CL 96* 97* 98  CO2 24 24 26   GLUCOSE  95 105* 91  BUN 8 8 13   CREATININE 1.01 0.88 0.90  CALCIUM 9.1 8.3* 8.4*  GFRNONAA >60 >60 >60  GFRAA >60 >60 >60  ANIONGAP 14 11 8      Hematology Recent Labs  Lab 05/30/20 1702 05/31/20 0347 06/01/20 0439  WBC 13.7* 9.1 10.6*  RBC 4.64 4.14* 4.12*  HGB 14.3 12.6* 12.7*  HCT 44.0 38.0* 38.3*  MCV 94.8 91.8 93.0  MCH 30.8 30.4 30.8  MCHC 32.5 33.2 33.2  RDW 12.7 12.7 12.6  PLT 221 209 203    BNP Recent Labs  Lab 05/30/20 1702  BNP 392.5*     DDimer No results for input(s): DDIMER in the last 168 hours.   Radiology    DG Chest Portable 1 View  Result Date: 05/30/2020 CLINICAL DATA:  Atrial fibrillation EXAM: PORTABLE CHEST 1 VIEW COMPARISON:  03/10/2020 chest radiograph. FINDINGS: Stable cardiomediastinal silhouette with normal heart size. No pneumothorax. No pleural effusion. No pulmonary edema. Mild platelike lateral left basilar scarring versus atelectasis. No acute consolidative airspace disease. IMPRESSION: Mild platelike lateral left basilar scarring versus atelectasis. Otherwise no active disease. Electronically Signed   By: Janina Mayo.D.  On: 05/30/2020 17:57   CT Renal Stone Study  Result Date: 05/30/2020 CLINICAL DATA:  Hematuria.  History of prostate cancer. EXAM: CT ABDOMEN AND PELVIS WITHOUT CONTRAST TECHNIQUE: Multidetector CT imaging of the abdomen and pelvis was performed following the standard protocol without IV contrast. COMPARISON:  CT AP 12/21/2016 FINDINGS: Lower chest: No acute abnormality. Hepatobiliary: 7 mm low-attenuation structure along the dome of liver is unchanged and remains too small to characterize but favored to represent a small cyst. Gallbladder appears normal. No biliary dilatation. Pancreas: Unremarkable. No pancreatic ductal dilatation or surrounding inflammatory changes. Spleen: Normal in size without focal abnormality. Adrenals/Urinary Tract: Normal appearance of the adrenal glands. No kidney stones identified bilaterally. No  hydronephrosis. Cystectomy with right lower quadrant ileal conduit. Stomach/Bowel: Stomach is within normal limits. Appendix appears normal. No evidence of bowel wall thickening, distention, or inflammatory changes. Vascular/Lymphatic: Aortic atherosclerosis. No aneurysm. No abdominal or pelvic adenopathy. Reproductive: Prostatectomy. Other: No free fluid or fluid collections identified. Musculoskeletal: Scoliosis and multilevel degenerative disc disease identified within the lumbar spine. No acute or suspicious osseous abnormalities. Previous left hip arthroplasty. IMPRESSION: 1. No acute findings within the abdomen or pelvis. No findings to explain patient's hematuria. 2. Status post cystoprostatectomy with right lower quadrant ileal conduit. 3. No evidence for metastatic disease. 4. Aortic atherosclerosis. Aortic Atherosclerosis (ICD10-I70.0). Electronically Signed   By: Kerby Moors M.D.   On: 05/30/2020 20:34    Cardiac Studies   Echocardiogram 04/05/2020:  1. Pt appears to be in atrial flutter during the study; low normal LV  function; mildly dilated aortic root; moderate RAE; mild RVE.  2. Left ventricular ejection fraction, by estimation, is 50 to 55%. The  left ventricle has low normal function. The left ventricle has no regional  wall motion abnormalities. Left ventricular diastolic parameters are  indeterminate.  3. Right ventricular systolic function is normal. The right ventricular  size is mildly enlarged. Tricuspid regurgitation signal is inadequate for  assessing PA pressure.  4. Right atrial size was moderately dilated.  5. The mitral valve is normal in structure. Trivial mitral valve  regurgitation. No evidence of mitral stenosis.  6. The aortic valve is tricuspid. Aortic valve regurgitation is not  visualized. Mild aortic valve sclerosis is present, with no evidence of  aortic valve stenosis.  7. There is mild dilatation of the aortic root measuring 40 mm.  8. The  inferior vena cava is dilated in size with >50% respiratory  variability, suggesting right atrial pressure of 8 mmHg.   Patient Profile     79 y.o. male PAF, Echo with low normal EF and mildly dilated AoRoot- not placed on anticoagulation with short duration on a fib in past. Now admitted with UTI and found to be in a fib.     Assessment & Plan    Atrial fibrillation -PAF, [id number] of 3 --rate control BB increased yesterday  May need to increase to 75 BID --off dilt  --pt unaware of atrial fib.  Chronic anticoagulation --began yesterday on Eliquis  Elevated troponin secondary to a fib with RVR, demand ischemia.  No ischemic work up needed.   Hypothyroidism -Per IM.         For questions or updates, please contact [facility] Please consult [email or web address] for contact info under        Signed, Cecilie Kicks, NP  06/01/2020, 8:35 AM    Personally seen and examined. Agree with above.   79 year old with paroxysmal atrial fibrillation, still in atrial fibrillation improved  rate control.  No chest pain no fevers no chills. Data reviewed as above.  Physical exam-alert and oriented, lungs clear, irregularly irregular rhythm  Assessment and plan  Paroxysmal atrial fibrillation -I will go ahead and increase his metoprolol to 100 twice a day.  Chronic anticoagulation -Eliquis.  Elevated troponin -Demand ischemia with A. Fib.  Hyperthyroidism -Mildly elevated thyroid hormone noted.  Per primary team.  Candee Furbish, MD

## 2020-06-01 NOTE — Evaluation (Signed)
Physical Therapy Evaluation Patient Details Name: Ronald Lowery MRN: [id number] DOB: 08-17-1941 Today's Date: 06/01/2020   History of Present Illness  79 y.o. male with medical history significant of HTN, HLD, bladder and prostate CA s/p cystectomy and ileal conduit admitted with malaise, a fib and weakness. Dx of sepsis.  Clinical Impression  Pt admitted with above diagnosis.  Min assist for bed mobility and to ambulate 15' with RW. Pt is somewhat confused, he is oriented to self and location, but not to month/year, and has decreased safety awareness. He pulled off his pulse oximeter probe and pulled at his urostomy tube, he also walked into a wall with RW and just stood there. He required verbal/manual cues to manage RW to walk to recliner. Unclear if this is baseline cognition or related to sepsis, no family present.  Pt currently with functional limitations due to the deficits listed below (see PT Problem List). Pt will benefit from skilled PT to increase their independence and safety with mobility to allow discharge to the venue listed below.       Follow Up Recommendations Home health PT;Supervision/Assistance - 24 hour;Supervision for mobility/OOB;SNF (may need SNF if he remains confused and spouse is unable to provide care)    Equipment Recommendations  Rolling walker with 5" wheels    Recommendations for Other Services       Precautions / Restrictions Precautions Precautions: Fall Precaution Comments: confusion Restrictions Weight Bearing Restrictions: No      Mobility  Bed Mobility Overal bed mobility: Needs Assistance Bed Mobility: Supine to Sit     Supine to sit: Min assist     General bed mobility comments: assist to raise trunk  Transfers Overall transfer level: Needs assistance Equipment used: Rolling walker (2 wheeled) Transfers: Sit to/from Stand Sit to Stand: Min assist         General transfer comment: min A to manage RW and to avoid obstacles, pt  bumped into a wall with RW and just stood there, he required verbal cues and assist with RW to steer away from the wall  Ambulation/Gait Ambulation/Gait assistance: Min assist Gait Distance (Feet): 15 Feet Assistive device: Rolling walker (2 wheeled) Gait Pattern/deviations: Step-through pattern Gait velocity: decr   General Gait Details: min A to negotiate obstacles, poor safety awareness, walked into a wall (see above), no loss of balance  Stairs            Wheelchair Mobility    Modified Rankin (Stroke Patients Only)       Balance Overall balance assessment: Needs assistance   Sitting balance-Leahy Scale: Poor Sitting balance - Comments: significant posterior lean, but unaware of this with no attempt to correct Postural control: Posterior lean Standing balance support: Bilateral upper extremity supported Standing balance-Leahy Scale: Poor Standing balance comment: relies on BUE support                             Pertinent Vitals/Pain Pain Assessment: No/denies pain    Home Living Family/patient expects to be discharged to:: Private residence Living Arrangements: Spouse/significant other Available Help at Discharge: Family Type of Home: House Home Access: Stairs to enter Entrance Stairs-Rails: Can reach both (at front door ) Technical brewer of Steps: 10 Front, 10 in garage  Home Layout: One level Home Equipment: Environmental consultant - 2 wheels;Shower seat;Grab bars - tub/shower      Prior Function Level of Independence: Independent  Comments: walking a mile in the mornings, patient's spouse has arthritic knees unable to provide physical assistance      Hand Dominance   Dominant Hand: Right    Extremity/Trunk Assessment   Upper Extremity Assessment Upper Extremity Assessment: Overall WFL for tasks assessed    Lower Extremity Assessment Lower Extremity Assessment: Overall WFL for tasks assessed    Cervical / Trunk  Assessment Cervical / Trunk Assessment: Normal  Communication   Communication: No difficulties  Cognition Arousal/Alertness: Awake/alert Behavior During Therapy: Restless Overall Cognitive Status: No family/caregiver present to determine baseline cognitive functioning                                 General Comments: pt picking and pulling at his lines (pulled off pulse ox finger probe and pulled at his urostomy tube), walked into a wall with RW and just stood there, unable to state month/year but used date on his monitor to find this information when asked, overall decreased safety awareness      General Comments      Exercises     Assessment/Plan    PT Assessment Patient needs continued PT services  PT Problem List Decreased cognition;Decreased mobility;Decreased balance       PT Treatment Interventions Gait training;Functional mobility training;Therapeutic activities;Therapeutic exercise;Patient/family education;Balance training;Cognitive remediation    PT Goals (Current goals can be found in the Care Plan section)  Acute Rehab PT Goals PT Goal Formulation: Patient unable to participate in goal setting Time For Goal Achievement: 06/15/20 Potential to Achieve Goals: Good    Frequency Min 3X/week   Barriers to discharge        Co-evaluation               AM-PAC PT "6 Clicks" Mobility  Outcome Measure Help needed turning from your back to your side while in a flat bed without using bedrails?: A Little Help needed moving from lying on your back to sitting on the side of a flat bed without using bedrails?: A Little Help needed moving to and from a bed to a chair (including a wheelchair)?: A Little Help needed standing up from a chair using your arms (e.g., wheelchair or bedside chair)?: A Little Help needed to walk in hospital room?: A Little Help needed climbing 3-5 steps with a railing? : A Little 6 Click Score: 18    End of Session Equipment  Utilized During Treatment: Gait belt Activity Tolerance: Patient tolerated treatment well Patient left: in chair;with call bell/phone within reach;with chair alarm set Nurse Communication: Mobility status PT Visit Diagnosis: Difficulty in walking, not elsewhere classified (R26.2)    Time: 9024-0973 PT Time Calculation (min) (ACUTE ONLY): 20 min   Charges:   PT Evaluation $PT Eval Moderate Complexity: 1 Mod         Philomena Doheny PT 06/01/2020  Acute Rehabilitation Services Pager [phone] Office [phone]

## 2020-06-01 NOTE — Progress Notes (Addendum)
Occupational Therapy Evaluation  Patient with functional deficits listed below impacting safety and independence with self care. Patient min A for bed mobility with initial posterior-R lateral loss of balance at edge of bed. Patient able to don socks seated EOB however is distractible requiring cues to redirect. Patient min A for functional transfers and ambulation in room with walker with x1 posterior loss of balance backing up to bed and poor management of walker with ambulation. Patient having difficulty with sustained attention during PLOF questions, often losing train of thought. 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     06/01/20 1400  OT Visit Information  Last OT Received On 06/01/20  Assistance Needed +1  History of Present Illness 79 y.o. male with medical history significant of HTN, HLD, bladder and prostate CA s/p cystectomy and ileal conduit admitted with malaise, a fib and weakness. Dx of sepsis.  Precautions  Precautions Fall  Precaution Comments confusion  Restrictions  Weight Bearing Restrictions No  Home Living  Family/patient expects to be discharged to: Private residence  Living Arrangements Spouse/significant other  Available Help at Discharge Family  Type of [place] to enter  Entrance Stairs-Number of Steps 10 Front, 10 in garage   Entrance Stairs-Rails Can reach both (at front door)  Home Layout One level  [employer] Shower/Tub Walk-in shower  [place] - 2 wheels;Shower seat;Grab bars - tub/shower  Prior Function  Level of Independence Independent  Comments walking a mile in the mornings, patient's spouse has arthritic knees unable to provide physical  assistance   Communication  Communication No difficulties  Pain Assessment  Pain Assessment No/denies pain  Cognition  Arousal/Alertness Awake/alert  Behavior During Therapy Restless  Overall Cognitive Status No family/caregiver present to determine baseline cognitive functioning  General Comments patient having difficulty with sustained attention, often losing train of thought during OT's PLOF questions requiring prompting/redirection. patient is very pleasant but does not seem at baseline. patient able to state month is August (date off by 2 days) unable to recall how long he has been in the hospital ( 2 days at this time)  Upper Extremity Assessment  Upper Extremity Assessment Generalized weakness  Lower Extremity Assessment  Lower Extremity Assessment Defer to PT evaluation  Cervical / Trunk Assessment  Cervical / Trunk Assessment Normal  ADL  Overall ADL's  Needs assistance/impaired  Grooming Supervision/safety;Sitting  Upper Body Bathing Supervision/ safety;Sitting  Lower Body Bathing Minimal assistance;Sit to/from stand  Upper Body Dressing  Supervision/safety;Sitting  Lower Body Dressing Minimal assistance;Sitting/lateral leans;Sit to/from stand  Lower Body Dressing Details (indicate cue type and reason) patient able to doff personal socks and don hospital socks seated EOB however is distractable requiring cues to redirect to task  Toilet Transfer Minimal assistance;Ambulation;RW;Cueing for safety  Toilet Transfer Details (indicate cue type and reason) simulated with functional mobility in room, loss of balance posteriorly when backing up to edge of bed min A for safety  Toileting- Clothing Manipulation and Hygiene Minimal assistance;Sit to/from stand  Functional mobility during ADLs Minimal assistance;Rolling walker;Cueing for safety;Cueing for sequencing  General ADL Comments patient requiring increased assistance with self care due to decreased cognition, safety awareness,  activity tolerance, balance  Bed Mobility  Overal  bed mobility Needs Assistance  Bed Mobility Supine to Sit;Sit to Supine  Supine to sit Min assist  Sit to supine Min guard  General bed mobility comments min A to raise trunk, min G back to bed for safety  Transfers  Overall transfer level Needs assistance  Equipment used Rolling walker (2 wheeled)  Transfers Sit to/from Stand  Sit to Stand Min assist;From elevated surface  General transfer comment cues for hand placement, min A to steady and cues for safety navigating in room with walker  Balance  Overall balance assessment Needs assistance  Sitting-balance support No upper extremity supported;Feet supported  Sitting balance-Leahy Scale Fair  Sitting balance - Comments able to don socks seated EOB  Standing balance support Bilateral upper extremity supported  Standing balance-Leahy Scale Poor  Standing balance comment relies on BUE support  General Comments  General comments (skin integrity, edema, etc.) patient HR at rest between 90-140s, with activity note between 120 up to 169 briefly with ambulation, RN aware/consent to OT prior to session. patient denied SOB and dizziness throughout  OT - End of Session  Equipment Utilized During Treatment Rolling walker  Activity Tolerance Patient tolerated treatment well  Patient left in bed;with call bell/phone within reach;with bed alarm set  Nurse Communication Mobility status  OT Assessment  OT Recommendation/Assessment Patient needs continued OT Services  OT Visit Diagnosis Unsteadiness on feet (R26.81);Muscle weakness (generalized) (M62.81)  OT Problem List Decreased strength;Decreased activity tolerance;Impaired balance (sitting and/or standing);Decreased safety awareness;Decreased cognition  Barriers to Discharge Comments patient reports spouse has 2 bad knees and cannot assist physically "if anything I need to help her" also reports has son and daughter in law locally but both work   OT Plan  OT Frequency (ACUTE ONLY) Min 2X/week  OT Treatment/Interventions (ACUTE ONLY) Self-care/ADL training;Therapeutic exercise, energy conservation, DME and/or AE instruction, therapeutic activities, cognitive remediation/compensation, patient/family education, balance training   AM-PAC OT "6 Clicks" Daily Activity Outcome Measure (Version 2)  Help from another person eating meals? 4  Help from another person taking care of personal grooming? 3  Help from another person toileting, which includes using toliet, bedpan, or urinal? 3  Help from another person bathing (including washing, rinsing, drying)? 3  Help from another person to put on and taking off regular upper body clothing? 3  Help from another person to put on and taking off regular lower body clothing? 3  6 Click Score 19  OT Recommendation  Follow Up Recommendations Home health OT;Supervision/Assistance - 24 hour;Other (comment) (vs SNF pending improved cognition + assist at home)  OT Equipment None recommended by OT  Individuals Consulted  Consulted and Agree with Results and Recommendations Patient  Acute Rehab OT Goals  Patient Stated Goal get moving  OT Goal Formulation With patient  Time For Goal Achievement 06/15/20  Potential to Achieve Goals Good  OT Time Calculation  OT Start Time (ACUTE ONLY) 1029  OT Stop Time (ACUTE ONLY) 1108  OT Time Calculation (min) 39 min  OT General Charges  $OT Visit 1 Visit  OT Evaluation  $OT Eval Moderate Complexity 1 Mod  OT Treatments  $Self Care/Home Management  23-37 mins  Written Expression  Dominant Hand Right   Delbert Phenix OT OT pager: [phone]

## 2020-06-02 ENCOUNTER — Telehealth: Payer: Medicare Other | Admitting: Cardiology

## 2020-06-02 LAB — BASIC METABOLIC PANEL
Anion gap: 8 (ref 5–15)
BUN: 17 mg/dL (ref 8–23)
CO2: 22 mmol/L (ref 22–32)
Calcium: 8.2 mg/dL — ABNORMAL LOW (ref 8.9–10.3)
Chloride: 99 mmol/L (ref 98–111)
Creatinine, Ser: 0.87 mg/dL (ref 0.61–1.24)
GFR calc Af Amer: >60 mL/min (ref >60)
GFR calc non Af Amer: >60 mL/min (ref >60)
Glucose, Bld: 89 mg/dL (ref 70–99)
Potassium: 4.1 mmol/L (ref 3.5–5.1)
Sodium: 129 mmol/L — ABNORMAL LOW (ref 135–145)

## 2020-06-02 LAB — CBC
HCT: 36.6 % — ABNORMAL LOW (ref 39.0–52.0)
Hemoglobin: 12.4 g/dL — ABNORMAL LOW (ref 13.0–17.0)
MCH: 30.7 pg (ref 26.0–34.0)
MCHC: 33.9 g/dL (ref 30.0–36.0)
MCV: 90.6 fL (ref 80.0–100.0)
Platelets: 224 10*3/uL (ref 150–400)
RBC: 4.04 MIL/uL — ABNORMAL LOW (ref 4.22–5.81)
RDW: 12.3 % (ref 11.5–15.5)
WBC: 8.4 10*3/uL (ref 4.0–10.5)
nRBC: 0 % (ref 0.0–0.2)

## 2020-06-02 MED ORDER — APIXABAN 5 MG PO TABS: 5.0000 mg | ORAL_TABLET | Freq: Two times a day (BID) | ORAL | 1 refills | Status: DC

## 2020-06-02 MED ORDER — METOPROLOL TARTRATE 100 MG PO TABS: 100.0000 mg | ORAL_TABLET | Freq: Two times a day (BID) | ORAL | 0 refills | Status: DC

## 2020-06-02 NOTE — Progress Notes (Addendum)
Progress Note  Patient Name: Ronald Lowery Date of Encounter: 06/02/2020  [facility] Cardiologist: Buford Dresser, MD   Subjective   No chest pain, no SOB  Inpatient Medications    Scheduled Meds: . apixaban  5 mg Oral BID  . gabapentin  200 mg Oral QHS  . metoprolol tartrate  100 mg Oral BID  . zolpidem  5 mg Oral QHS   Continuous Infusions:  PRN Meds: acetaminophen **OR** acetaminophen, metoprolol tartrate, ondansetron **OR** ondansetron (ZOFRAN) IV   Vital Signs    Vitals:   06/01/20 2157 06/02/20 0215 06/02/20 0629 06/02/20 0926  BP: 114/76 118/79 116/87   Pulse: (!) 58 67 86 (!) 120  Resp:      Temp: 98.2 F (36.8 C) 98.8 F (37.1 C) 98.6 F (37 C)   TempSrc:  Oral Oral   SpO2:      Weight:      Height:        Intake/Output Summary (Last 24 hours) at 06/02/2020 1335 Last data filed at 06/02/2020 0700 Gross per 24 hour  Intake 360 ml  Output 2500 ml  Net -2140 ml   Last 3 Weights 05/31/2020 03/16/2020 07/07/2016  Weight (lbs) 175 lb 14.4 oz 179 lb 3.2 oz 180 lb 12.4 oz  Weight (kg) 79.788 kg 81.285 kg 82 kg      Telemetry    A fib with improved Rate control, mostly in the 80s up some with exertion.   - Personally Reviewed  ECG    No new - Personally Reviewed  Physical Exam   GEN: No acute distress.   Neck: No JVD Cardiac: irreg irreg, no murmurs, rubs, or gallops.  Respiratory: Clear to auscultation bilaterally. GI: Soft, nontender, non-distended  MS: No edema; No deformity. Neuro:  Nonfocal  Psych: Normal affect   Labs    High Sensitivity Troponin:   Recent Labs  Lab 05/30/20 1702 05/30/20 1935 05/30/20 2158 05/31/20 0010  TROPONINIHS 116* 123* 110* 117*      Chemistry Recent Labs  Lab 05/31/20 0347 06/01/20 0439 06/02/20 0437  NA 132* 132* 129*  K 3.7 4.5 4.1  CL 97* 98 99  CO2 24 26 22   GLUCOSE 105* 91 89  BUN 8 13 17   CREATININE 0.88 0.90 0.87  CALCIUM 8.3* 8.4* 8.2*  GFRNONAA >60 >60 >60  GFRAA >60  >60 >60  ANIONGAP 11 8 8      Hematology Recent Labs  Lab 05/31/20 0347 06/01/20 0439 06/02/20 0437  WBC 9.1 10.6* 8.4  RBC 4.14* 4.12* 4.04*  HGB 12.6* 12.7* 12.4*  HCT 38.0* 38.3* 36.6*  MCV 91.8 93.0 90.6  MCH 30.4 30.8 30.7  MCHC 33.2 33.2 33.9  RDW 12.7 12.6 12.3  PLT 209 203 224    BNP Recent Labs  Lab 05/30/20 1702  BNP 392.5*     DDimer No results for input(s): DDIMER in the last 168 hours.   Radiology    No results found.  Cardiac Studies   Echocardiogram 04/05/2020:  1. Pt appears to be in atrial flutter during the study; low normal LV  function; mildly dilated aortic root; moderate RAE; mild RVE.  2. Left ventricular ejection fraction, by estimation, is 50 to 55%. The  left ventricle has low normal function. The left ventricle has no regional  wall motion abnormalities. Left ventricular diastolic parameters are  indeterminate.  3. Right ventricular systolic function is normal. The right ventricular  size is mildly enlarged. Tricuspid regurgitation signal is inadequate  for  assessing PA pressure.  4. Right atrial size was moderately dilated.  5. The mitral valve is normal in structure. Trivial mitral valve  regurgitation. No evidence of mitral stenosis.  6. The aortic valve is tricuspid. Aortic valve regurgitation is not  visualized. Mild aortic valve sclerosis is present, with no evidence of  aortic valve stenosis.  7. There is mild dilatation of the aortic root measuring 40 mm.  8. The inferior vena cava is dilated in size with >50% respiratory  variability, suggesting right atrial pressure of 8 mmHg.   Patient Profile     79 y.o. male with hx of PAF, Echo with low normal EF and mildly dilated AoRoot- not placed on anticoagulation with short duration on a fib in past. Now admitted with UTI and found to be in a fib.     Assessment & Plan    Atrial fibrillation -PAF, cha2DS2VASc of 3 --rate control improved BB increased yesterday now 100  bid--off dilt  --pt unaware of atrial fib.  Chronic anticoagulation --began Eliquis  Elevated troponin secondary to a fib with RVR, demand ischemia.  No ischemic work up needed.   Hypothyroidism -Per IM.     UTI per IM      For questions or updates, please contact [place] Please consult [email or web address] for contact info under        Signed, Candee Furbish, MD  06/02/2020, 1:35 PM    Personally seen and examined. Agree with above.   Doing better, atrial fibrillation under better control.  No chest pain.  Exam, irregularly irregular normal rate, lungs clear  Lab work reviewed as above  Assessment and plan: Paroxysmal atrial relation Chronic anticoagulation Elevated troponin secondary to atrial fibrillation with RVR/demand ischemia  -Continue with rate control.  Metoprolol at 100 twice daily.  Eliquis, no ischemic work-up necessary.  Continue with current medication strategy.  We will go ahead and sign off.  Please call us if you need any further assistance.  Candee Furbish, MD

## 2020-06-02 NOTE — TOC Transition Note (Signed)
Transition of Care [facility]) - CM/SW Discharge Note   Patient Details  Name: AYUB KIRSH MRN: [id number] Date of Birth: October 31, 1940  [facility] CM/SW Contact:  Ross Ludwig, LCSW Phone Number: 06/02/2020, 6:13 PM   Clinical Narrative:     CSW spoke to patient's wife and son, to discuss SNF verse home health.  Patient's family decided they would rather have home health.  CSW contacted [facility] and spoke to [place], she is able to accept patient for [facility] services.  Patient will be going home with home health through  [facility].  CSW signing off please reconsult with any other social work needs, home health agency has been notified of planned discharge.    Final next level of care: Home w Home Health Services Barriers to Discharge: Barriers Resolved   Patient Goals and CMS Choice Patient states their goals for this hospitalization and ongoing recovery are:: Patient plans to return back home with his wife. CMS Medicare.gov Compare Post Acute Care list provided to:: Patient Choice offered to / list presented to : Spouse  Discharge Placement  Patient planning to discharge home with home health services.                     Discharge Plan and Services     Post Acute Care Choice: Home Health            DME Agency: NA       HH Arranged: PT, OT, RN [facility] Agency: [facility] Date [facility] Agency Contacted: 06/02/20 Time [place]: [id number] Representative spoke with at [place]: [place] Interventions     Readmission Risk Interventions No flowsheet data found.

## 2020-06-02 NOTE — Discharge Summary (Signed)
Physician Discharge Summary  Ronald Lowery ZOX:[id number] DOB: [birth year] DOA: 05/30/2020  PCP: Burnard Bunting, MD  Admit date: 05/30/2020 Discharge date: 06/02/2020  Admitted From:Home Disposition:HH  Recommendations for Outpatient Follow-up:  1. Follow up with PCP in 1-2 weeks 2. Please obtain BMP/CBC in one week 3. Please follow up on the following pending results:  Home Health:Yes  Equipment/Devices: None  Discharge Condition: Stable Code Status:   Code Status: Full Code Diet recommendation:  Diet Order            Diet - low sodium heart healthy           Diet Heart Room service appropriate? Yes; Fluid consistency: Thin  Diet effective now                 Brief/Interim Summary: 79 year old male with hypertension, hyperlipidemia bladder and prostate cancer status post cystectomy and ileal conduit comes to the ED for evaluation of malaise weakness and rapid A. Fib.  He was diagnosed with A. fib back in May 2021.Converted to NSR on way to ER from PCP office. Cards saw pt on 03/16/20 nl EKG at that time, patient then had echocardiogram performed 04/05/2020 where he was found to be in atrial flutter with low normal LV EF, moderate RAE, and mildly dilated aortic root.Not on anticoagulation at this time. In the ED leukocytosis low-grade temperature 103 A. fib with RVR in the 120s to 140s after Cardizem her rate slowed down but subsequently back into rapid A. fib and metoprolol dose was increased.  Covid negative chest x-ray negative patient ceftriaxone and bolus fluids, troponin I 116.  Urine analysis with bacteria no wbc  trace LE moderate hemoglobin he was admitted for further management. Patient was treated for sepsis/UTI POA, in the setting of ileal conduit.  Urine culture mixed organism blood culture negative and patient finished 3 days course of antibiotics blood culture negative.  He had episode with uncontrolled RVR heart rate 150s placed on Cardizem drip and also metoprolol,  dose was increased and at this time tolerating metoprolol heart rate well controlled on 100 mg metoprolol twice daily, seen by cardiology and signed off.  He will continue on Eliquis for the anticoagulation for his CHA2DS2-VASc score of 3. He appears alert awake mildly forgetful but conversant and he ambulated in the hallway multiple times with nursing staff heart rate is in 90s He will follow up with cardiology as outpatient.  Discharge Diagnoses:  Principal Problem: Sepsis POAUTI-resolved.Blood culture negative , culture urine multiple species.   Active Problems:  Atrial fibrillation with RVR - he will continue the metoprolol, Eliquis.He is still in A. Fib but rate controlled.  Seen and signed off by cardiology today. Elevated troponin-from A. fib no ischemic work-up planned. Hypothyroidism but appears to be hyperthyroidism with elevated free T4 and suppressed TSH Synthroid has been held - advised to check TSH free T4 in 2 weeks and may resume Synthroid at low-dose based upon the thyroid function panel and he will fu annual follow-up with PCP  Gen weakness debility home health PT OT is being set up Mild hyponatremia advised BMP check in 1 week.  Encourage oral intake  Consults:  Cardiology  Subjective: Alert awake resting comfortably.  No new complaints.  Asking when he can go home today.  Discharge Exam: Vitals:   06/02/20 0629 06/02/20 0926  BP: 116/87   Pulse: 86 (!) 120  Resp:    Temp: 98.6 F (37 C)   SpO2:  General: Pt is alert, awake, not in acute distress Cardiovascular: RRR, S1/S2 +, no rubs, no gallops Respiratory: CTA bilaterally, no wheezing, no rhonchi Abdominal: Soft, NT, ND, bowel sounds + Extremities: no edema, no cyanosis  Discharge Instructions  Discharge Instructions    Diet - low sodium heart healthy   Complete by: As directed    Discharge instructions   Complete by: As directed    Please call call MD or return to ER for similar or worsening  recurring problem that brought you to hospital or if any fever,nausea/vomiting,abdominal pain, uncontrolled pain, chest pain,  shortness of breath or any other alarming symptoms.  Please follow-up your primary doctor for TSH/Free T4,repeat  as instructed in a week time and call the office for appointment.  Please,avoid alcohol,smoking,or any other illicit substance and maintain healthy habits including taking your regular medications as prescribed.  You were cared for by a hospitalist during your hospital stay.If you have any questions about your discharge medications or the care you received while you were in the hospital after you are discharged, you can call the unit and ask to speak with the hospitalist on call if the hospitalist that took care of you is not available.  Once you are discharged, your primary care physician will handle any further medical issues.Please note that NO REFILLS for any discharge medications will be authorized once you are discharged, as it is imperative that you return to your primary care physician (or establish a relationship with a primary care physician if you do not have one) for your aftercare needs so that they can reassess your need for medications and monitor your lab values   Increase activity slowly   Complete by: As directed      Allergies as of 06/02/2020      Reactions   Cefdinir Other (See Comments)   Muscle pain   Myrbetriq [mirabegron] Itching      Medication List    STOP taking these medications   levothyroxine 100 MCG tablet Commonly known as: SYNTHROID     TAKE these medications   apixaban 5 MG Tabs tablet Commonly known as: ELIQUIS Take 1 tablet (5 mg total) by mouth 2 (two) times daily.   gabapentin 100 MG capsule Commonly known as: NEURONTIN Take 200 mg by mouth at bedtime.   metoprolol tartrate 100 MG tablet Commonly known as: LOPRESSOR Take 1 tablet (100 mg total) by mouth 2 (two) times daily.   zolpidem 10 MG  tablet Commonly known as: AMBIEN Take 5 mg by mouth at bedtime.       Follow-up Information    Burnard Bunting, MD Follow up in 2 week(s).   Specialty: Internal Medicine Why: TSH f/u Contact information: [place] [address] [phone]        Buford Dresser, MD .   Specialty: Cardiology Contact information: [address] [place] [address] [phone]              Allergies  Allergen Reactions  . Cefdinir Other (See Comments)    Muscle pain  . Myrbetriq [Mirabegron] Itching   The results of significant diagnostics from this hospitalization (including imaging, microbiology, ancillary and laboratory) are listed below for reference.    Microbiology: Recent Results (from the past 240 hour(s))  SARS Coronavirus 2 by RT PCR (hospital order, performed in [facility] hospital lab) Nasopharyngeal Nasopharyngeal Swab     Status: None   Collection Time: 05/30/20  5:21 PM   Specimen: Nasopharyngeal  Swab  Result Value Ref Range Status   SARS Coronavirus 2 NEGATIVE NEGATIVE Final    Comment: (NOTE) SARS-CoV-2 target nucleic acids are NOT DETECTED.  The SARS-CoV-2 RNA is generally detectable in upper and lower respiratory specimens during the acute phase of infection. The lowest concentration of SARS-CoV-2 viral copies this assay can detect is 250 copies / mL. A negative result does not preclude SARS-CoV-2 infection and should not be used as the sole basis for treatment or other patient management decisions.  A negative result may occur with improper specimen collection / handling, submission of specimen other than nasopharyngeal swab, presence of viral mutation(s) within the areas targeted by this assay, and inadequate number of viral copies (<250 copies / mL). A negative result must be combined with clinical observations, patient history, and epidemiological information.  Fact Sheet for Patients:    StrictlyIdeas.no  Fact Sheet for Healthcare Providers: BankingDealers.co.za  This test is not yet approved or  cleared by the [place] FDA and has been authorized for detection and/or diagnosis of SARS-CoV-2 by FDA under an Emergency Use Authorization (EUA).  This EUA will remain in effect (meaning this test can be used) for the duration of the COVID-19 declaration under Section 564(b)(1) of the Act, 21 U.S.C. section 360bbb-3(b)(1), unless the authorization is terminated or revoked sooner.  Performed at [facility], [place] [address] [place], [place] [address]   Blood culture (routine x 2)     Status: None (Preliminary result)   Collection Time: 05/30/20  5:21 PM   Specimen: Site Not Specified; Blood  Result Value Ref Range Status   Specimen Description   Final    SITE NOT SPECIFIED Performed at [place] [address], [place], [place] [address]    Special Requests   Final    BOTTLES DRAWN AEROBIC AND ANAEROBIC Blood Culture results may not be optimal due to an inadequate volume of blood received in culture bottles Performed at [place] [address] [place], [place] [address]    Culture   Final    NO GROWTH 3 DAYS Performed at [facility], [place] [address], [place], [place] [address]    Report Status PENDING  Incomplete  Urine culture     Status: Abnormal   Collection Time: 05/30/20  5:21 PM   Specimen: Urine, Catheterized  Result Value Ref Range Status   Specimen Description   Final    URINE, CATHETERIZED Performed at [place] [address], [place], [place] [address]    Special Requests   Final    NONE Performed at [facility], [place] [address], [place], [place] [address]    Culture MULTIPLE SPECIES PRESENT, SUGGEST RECOLLECTION (A)  Final   Report Status 05/31/2020 FINAL  Final  Blood culture  (routine x 2)     Status: None (Preliminary result)   Collection Time: 05/30/20  7:35 PM   Specimen: BLOOD LEFT FOREARM  Result Value Ref Range Status   Specimen Description   Final    BLOOD LEFT FOREARM Performed at [place] [address], [place], [place] [address]    Special Requests   Final    BOTTLES DRAWN AEROBIC AND ANAEROBIC Blood Culture adequate volume Performed at [place] [address] [place],  [address]    Culture   Final    NO GROWTH 2 DAYS Performed at [place]  [address], [place], [place] [address]    Report Status PENDING  Incomplete    Procedures/Studies: DG Chest Portable 1 View  Result Date: 05/30/2020 CLINICAL DATA:  Atrial fibrillation EXAM: PORTABLE CHEST 1 VIEW COMPARISON:  03/10/2020 chest radiograph. FINDINGS: Stable cardiomediastinal silhouette with normal heart size. No pneumothorax. No pleural effusion. No pulmonary edema. Mild platelike lateral left basilar scarring versus atelectasis. No acute consolidative airspace disease. IMPRESSION: Mild platelike lateral left basilar scarring versus atelectasis. Otherwise no active disease. Electronically Signed   By: Ilona Sorrel M.D.   On: 05/30/2020 17:57   CT Renal Stone Study  Result Date: 05/30/2020 CLINICAL DATA:  Hematuria.  History of prostate cancer. EXAM: CT ABDOMEN AND PELVIS WITHOUT CONTRAST TECHNIQUE: Multidetector CT imaging of the abdomen and pelvis was performed following the standard protocol without IV contrast. COMPARISON:  CT AP 12/21/2016 FINDINGS: Lower chest: No acute abnormality. Hepatobiliary: 7 mm low-attenuation structure along the dome of liver is unchanged and remains too small to characterize but favored to represent a small cyst. Gallbladder appears normal. No biliary dilatation. Pancreas: Unremarkable. No pancreatic ductal dilatation or surrounding inflammatory changes. Spleen: Normal in size without focal  abnormality. Adrenals/Urinary Tract: Normal appearance of the adrenal glands. No kidney stones identified bilaterally. No hydronephrosis. Cystectomy with right lower quadrant ileal conduit. Stomach/Bowel: Stomach is within normal limits. Appendix appears normal. No evidence of bowel wall thickening, distention, or inflammatory changes. Vascular/Lymphatic: Aortic atherosclerosis. No aneurysm. No abdominal or pelvic adenopathy. Reproductive: Prostatectomy. Other: No free fluid or fluid collections identified. Musculoskeletal: Scoliosis and multilevel degenerative disc disease identified within the lumbar spine. No acute or suspicious osseous abnormalities. Previous left hip arthroplasty. IMPRESSION: 1. No acute findings within the abdomen or pelvis. No findings to explain patient's hematuria. 2. Status post cystoprostatectomy with right lower quadrant ileal conduit. 3. No evidence for metastatic disease. 4. Aortic atherosclerosis. Aortic Atherosclerosis (ICD10-I70.0). Electronically Signed   By: Kerby Moors M.D.   On: 05/30/2020 20:34   LONG TERM MONITOR (3-14 DAYS)  Result Date: 05/23/2020 7 days of data recorded on Zio monitor. Patient had a min HR of 61 bpm, max HR of 154 bpm, and avg HR of 107 bpm. Predominant underlying rhythm was atrial flutter (~100% burden). No VT, SVT, atrial fibrillation, high degree block, or pauses noted. Isolated ventricular ectopy was occasional (1.5%). There were 0 triggered events. Atrial flutter throughout study with variable conduction.  Labs: BNP (last 3 results) Recent Labs    03/10/20 1900 05/30/20 1702  BNP 90.1 563.1*   Basic Metabolic Panel: Recent Labs  Lab 05/30/20 1702 05/30/20 1935 05/31/20 0347 06/01/20 0439 06/02/20 0437  NA 134*  --  132* 132* 129*  K 3.9  --  3.7 4.5 4.1  CL 96*  --  97* 98 99  CO2 24  --  24 26 22   GLUCOSE 95  --  105* 91 89  BUN 8  --  8 13 17   CREATININE 1.01  --  0.88 0.90 0.87  CALCIUM 9.1  --  8.3* 8.4* 8.2*  MG  --   1.8  --   --   --    Liver Function Tests: No results for input(s): AST, ALT, ALKPHOS, BILITOT, PROT, ALBUMIN in the last 168 hours. No results for input(s): LIPASE, AMYLASE in the last 168 hours. No results for input(s): AMMONIA in the last 168 hours. CBC: Recent Labs  Lab 05/30/20 1702 05/31/20 0347 06/01/20 0439 06/02/20 0437  WBC 13.7* 9.1 10.6* 8.4  HGB 14.3 12.6*  12.7* 12.4*  HCT 44.0 38.0* 38.3* 36.6*  MCV 94.8 91.8 93.0 90.6  PLT 221 209 203 224   Cardiac Enzymes: No results for input(s): CKTOTAL, CKMB, CKMBINDEX, TROPONINI in the last 168 hours. BNP: Invalid input(s): POCBNP CBG: Recent Labs  Lab 05/30/20 1834  GLUCAP 82   D-Dimer No results for input(s): DDIMER in the last 72 hours. Hgb A1c No results for input(s): HGBA1C in the last 72 hours. Lipid Profile No results for input(s): CHOL, HDL, LDLCALC, TRIG, CHOLHDL, LDLDIRECT in the last 72 hours. Thyroid function studies Recent Labs    05/30/20 1935  TSH 0.110*   Anemia work up No results for input(s): VITAMINB12, FOLATE, FERRITIN, TIBC, IRON, RETICCTPCT in the last 72 hours. Urinalysis    Component Value Date/Time   COLORURINE YELLOW 05/30/2020 1721   APPEARANCEUR HAZY (A) 05/30/2020 1721   LABSPEC 1.010 05/30/2020 1721   PHURINE 7.0 05/30/2020 1721   GLUCOSEU NEGATIVE 05/30/2020 1721   HGBUR MODERATE (A) 05/30/2020 1721   BILIRUBINUR NEGATIVE 05/30/2020 1721   KETONESUR 20 (A) 05/30/2020 1721   PROTEINUR NEGATIVE 05/30/2020 1721   UROBILINOGEN 0.2 08/05/2015 0807   NITRITE NEGATIVE 05/30/2020 1721   LEUKOCYTESUR TRACE (A) 05/30/2020 1721   Sepsis Labs Invalid input(s): PROCALCITONIN,  WBC,  LACTICIDVEN Microbiology Recent Results (from the past 240 hour(s))  SARS Coronavirus 2 by RT PCR (hospital order, performed in [facility] hospital lab) Nasopharyngeal Nasopharyngeal Swab     Status: None   Collection Time: 05/30/20  5:21 PM   Specimen: Nasopharyngeal Swab  Result Value Ref Range  Status   SARS Coronavirus 2 NEGATIVE NEGATIVE Final    Comment: (NOTE) SARS-CoV-2 target nucleic acids are NOT DETECTED.  The SARS-CoV-2 RNA is generally detectable in upper and lower respiratory specimens during the acute phase of infection. The lowest concentration of SARS-CoV-2 viral copies this assay can detect is 250 copies / mL. A negative result does not preclude SARS-CoV-2 infection and should not be used as the sole basis for treatment or other patient management decisions.  A negative result may occur with improper specimen collection / handling, submission of specimen other than nasopharyngeal swab, presence of viral mutation(s) within the areas targeted by this assay, and inadequate number of viral copies (<250 copies / mL). A negative result must be combined with clinical observations, patient history, and epidemiological information.  Fact Sheet for Patients:   StrictlyIdeas.no  Fact Sheet for Healthcare Providers: BankingDealers.co.za  This test is not yet approved or  cleared by the [place] FDA and has been authorized for detection and/or diagnosis of SARS-CoV-2 by FDA under an Emergency Use Authorization (EUA).  This EUA will remain in effect (meaning this test can be used) for the duration of the COVID-19 declaration under Section 564(b)(1) of the Act, 21 U.S.C. section 360bbb-3(b)(1), unless the authorization is terminated or revoked sooner.  Performed at [facility], [place] [address], [place], [place] [address]   Blood culture (routine x 2)     Status: None (Preliminary result)   Collection Time: 05/30/20  5:21 PM   Specimen: Site Not Specified; Blood  Result Value Ref Range Status   Specimen Description   Final    SITE NOT SPECIFIED Performed at [place] [address] [place], [place] [address]    Special Requests   Final    BOTTLES DRAWN AEROBIC AND  ANAEROBIC Blood Culture results may not be optimal due to an inadequate volume of blood received in  culture bottles Performed at [place] [address], [place], [place] [address]    Culture   Final    NO GROWTH 3 DAYS Performed at [facility], [place] [address], [place], [place] [address]    Report Status PENDING  Incomplete  Urine culture     Status: Abnormal   Collection Time: 05/30/20  5:21 PM   Specimen: Urine, Catheterized  Result Value Ref Range Status   Specimen Description   Final    URINE, CATHETERIZED Performed at [place] [address], [place], [place] [address]    Special Requests   Final    NONE Performed at [facility], [place] [address], [place], [place] [address]    Culture MULTIPLE SPECIES PRESENT, SUGGEST RECOLLECTION (A)  Final   Report Status 05/31/2020 FINAL  Final  Blood culture (routine x 2)     Status: None (Preliminary result)   Collection Time: 05/30/20  7:35 PM   Specimen: BLOOD LEFT FOREARM  Result Value Ref Range Status   Specimen Description   Final    BLOOD LEFT FOREARM Performed at [place] [address], [place], [place] [address]    Special Requests   Final    BOTTLES DRAWN AEROBIC AND ANAEROBIC Blood Culture adequate volume Performed at [place] [address], [place], [place] [address]    Culture   Final    NO GROWTH 2 DAYS Performed at [place] [address], [place], [place] [address]    Report Status PENDING  Incomplete     Time coordinating discharge: 25 minutes  SIGNED: Antonieta Pert, MD  [facility] 06/02/2020, 3:08 PM  If 7PM-7AM, please contact night-coverage [email or web address]

## 2020-06-04 DIAGNOSIS — Z8551 Personal history of malignant neoplasm of bladder: Secondary | ICD-10-CM | POA: Diagnosis not present

## 2020-06-04 DIAGNOSIS — M419 Scoliosis, unspecified: Secondary | ICD-10-CM | POA: Diagnosis not present

## 2020-06-04 DIAGNOSIS — Z9049 Acquired absence of other specified parts of digestive tract: Secondary | ICD-10-CM | POA: Diagnosis not present

## 2020-06-04 DIAGNOSIS — F419 Anxiety disorder, unspecified: Secondary | ICD-10-CM | POA: Diagnosis not present

## 2020-06-04 DIAGNOSIS — Z87440 Personal history of urinary (tract) infections: Secondary | ICD-10-CM | POA: Diagnosis not present

## 2020-06-04 DIAGNOSIS — I4891 Unspecified atrial fibrillation: Secondary | ICD-10-CM | POA: Diagnosis not present

## 2020-06-04 DIAGNOSIS — E039 Hypothyroidism, unspecified: Secondary | ICD-10-CM | POA: Diagnosis not present

## 2020-06-04 DIAGNOSIS — Z7901 Long term (current) use of anticoagulants: Secondary | ICD-10-CM | POA: Diagnosis not present

## 2020-06-04 DIAGNOSIS — I10 Essential (primary) hypertension: Secondary | ICD-10-CM | POA: Diagnosis not present

## 2020-06-04 DIAGNOSIS — Z9181 History of falling: Secondary | ICD-10-CM | POA: Diagnosis not present

## 2020-06-04 DIAGNOSIS — K219 Gastro-esophageal reflux disease without esophagitis: Secondary | ICD-10-CM | POA: Diagnosis not present

## 2020-06-04 DIAGNOSIS — E785 Hyperlipidemia, unspecified: Secondary | ICD-10-CM | POA: Diagnosis not present

## 2020-06-04 DIAGNOSIS — F329 Major depressive disorder, single episode, unspecified: Secondary | ICD-10-CM | POA: Diagnosis not present

## 2020-06-04 DIAGNOSIS — M5136 Other intervertebral disc degeneration, lumbar region: Secondary | ICD-10-CM | POA: Diagnosis not present

## 2020-06-04 DIAGNOSIS — M1990 Unspecified osteoarthritis, unspecified site: Secondary | ICD-10-CM | POA: Diagnosis not present

## 2020-06-04 DIAGNOSIS — Z85828 Personal history of other malignant neoplasm of skin: Secondary | ICD-10-CM | POA: Diagnosis not present

## 2020-06-04 LAB — CULTURE, BLOOD (ROUTINE X 2): Culture: NO GROWTH

## 2020-06-05 LAB — CULTURE, BLOOD (ROUTINE X 2)
Culture: NO GROWTH
Special Requests: ADEQUATE

## 2020-06-06 DIAGNOSIS — F329 Major depressive disorder, single episode, unspecified: Secondary | ICD-10-CM | POA: Diagnosis not present

## 2020-06-06 DIAGNOSIS — I4891 Unspecified atrial fibrillation: Secondary | ICD-10-CM | POA: Diagnosis not present

## 2020-06-06 DIAGNOSIS — M1990 Unspecified osteoarthritis, unspecified site: Secondary | ICD-10-CM | POA: Diagnosis not present

## 2020-06-06 DIAGNOSIS — M5136 Other intervertebral disc degeneration, lumbar region: Secondary | ICD-10-CM | POA: Diagnosis not present

## 2020-06-06 DIAGNOSIS — F419 Anxiety disorder, unspecified: Secondary | ICD-10-CM | POA: Diagnosis not present

## 2020-06-06 DIAGNOSIS — I10 Essential (primary) hypertension: Secondary | ICD-10-CM | POA: Diagnosis not present

## 2020-06-08 DIAGNOSIS — I10 Essential (primary) hypertension: Secondary | ICD-10-CM | POA: Diagnosis not present

## 2020-06-08 DIAGNOSIS — F329 Major depressive disorder, single episode, unspecified: Secondary | ICD-10-CM | POA: Diagnosis not present

## 2020-06-08 DIAGNOSIS — M5136 Other intervertebral disc degeneration, lumbar region: Secondary | ICD-10-CM | POA: Diagnosis not present

## 2020-06-08 DIAGNOSIS — I4891 Unspecified atrial fibrillation: Secondary | ICD-10-CM | POA: Diagnosis not present

## 2020-06-08 DIAGNOSIS — M1990 Unspecified osteoarthritis, unspecified site: Secondary | ICD-10-CM | POA: Diagnosis not present

## 2020-06-08 DIAGNOSIS — F419 Anxiety disorder, unspecified: Secondary | ICD-10-CM | POA: Diagnosis not present

## 2020-06-09 DIAGNOSIS — M5136 Other intervertebral disc degeneration, lumbar region: Secondary | ICD-10-CM | POA: Diagnosis not present

## 2020-06-09 DIAGNOSIS — F419 Anxiety disorder, unspecified: Secondary | ICD-10-CM | POA: Diagnosis not present

## 2020-06-09 DIAGNOSIS — I10 Essential (primary) hypertension: Secondary | ICD-10-CM | POA: Diagnosis not present

## 2020-06-09 DIAGNOSIS — F329 Major depressive disorder, single episode, unspecified: Secondary | ICD-10-CM | POA: Diagnosis not present

## 2020-06-09 DIAGNOSIS — M1990 Unspecified osteoarthritis, unspecified site: Secondary | ICD-10-CM | POA: Diagnosis not present

## 2020-06-09 DIAGNOSIS — I4891 Unspecified atrial fibrillation: Secondary | ICD-10-CM | POA: Diagnosis not present

## 2020-06-15 DIAGNOSIS — A419 Sepsis, unspecified organism: Secondary | ICD-10-CM | POA: Diagnosis not present

## 2020-06-15 DIAGNOSIS — R778 Other specified abnormalities of plasma proteins: Secondary | ICD-10-CM | POA: Diagnosis not present

## 2020-06-15 DIAGNOSIS — E039 Hypothyroidism, unspecified: Secondary | ICD-10-CM | POA: Diagnosis not present

## 2020-06-15 DIAGNOSIS — N390 Urinary tract infection, site not specified: Secondary | ICD-10-CM | POA: Diagnosis not present

## 2020-06-15 DIAGNOSIS — I4891 Unspecified atrial fibrillation: Secondary | ICD-10-CM | POA: Diagnosis not present

## 2020-06-15 DIAGNOSIS — E785 Hyperlipidemia, unspecified: Secondary | ICD-10-CM | POA: Diagnosis not present

## 2020-06-28 ENCOUNTER — Other Ambulatory Visit: Payer: Self-pay

## 2020-06-28 ENCOUNTER — Ambulatory Visit (INDEPENDENT_AMBULATORY_CARE_PROVIDER_SITE_OTHER): Payer: Medicare Other | Admitting: Cardiology

## 2020-06-28 ENCOUNTER — Encounter: Payer: Self-pay | Admitting: Cardiology

## 2020-06-28 VITALS — BP 100/60 | HR 75 | Temp 94.8°F | Ht 75.0 in | Wt 172.6 lb

## 2020-06-28 DIAGNOSIS — I959 Hypotension, unspecified: Secondary | ICD-10-CM | POA: Diagnosis not present

## 2020-06-28 DIAGNOSIS — I4819 Other persistent atrial fibrillation: Secondary | ICD-10-CM

## 2020-06-28 DIAGNOSIS — R5383 Other fatigue: Secondary | ICD-10-CM

## 2020-06-28 DIAGNOSIS — Z7901 Long term (current) use of anticoagulants: Secondary | ICD-10-CM

## 2020-06-28 DIAGNOSIS — Z7189 Other specified counseling: Secondary | ICD-10-CM | POA: Diagnosis not present

## 2020-06-28 MED ORDER — AMIODARONE HCL 200 MG PO TABS: ORAL_TABLET | ORAL | 0 refills | Status: DC

## 2020-06-28 MED ORDER — METOPROLOL TARTRATE 25 MG PO TABS: 50.0000 mg | ORAL_TABLET | Freq: Two times a day (BID) | ORAL | 3 refills | Status: DC

## 2020-06-28 NOTE — Patient Instructions (Addendum)
Medication Instructions:  -Start amiodarone. You will take 200 mg twice a day for two weeks, then go to 200 mg daily.  -When you start the amiodarone, you can cut the metoprolol dose in half (will be 50 mg twice a day from 100 mg twice a day). - Continue the apixaban for now, we will discuss changing to coumadin in the future.   If your heart rate is more than 110 bpm sitting down/resting, you can take an additional 1/2 pill (50 mg) of metoprolol tartrate.  *If you need a refill on your cardiac medications before your next appointment, please call your pharmacy*   Lab Work: None ordered    Testing/Procedures: None ordered    Follow-Up: At [facility], you and your health needs are our priority.  As part of our continuing mission to provide you with exceptional heart care, we have created designated Provider Care Teams.  These Care Teams include your primary Cardiologist (physician) and Advanced Practice Providers (APPs -  Physician Assistants and Nurse Practitioners) who all work together to provide you with the care you need, when you need it.  We recommend signing up for the patient portal called "MyChart".  Sign up information is provided on this After Visit Summary.  MyChart is used to connect with patients for Virtual Visits (Telemedicine).  Patients are able to view lab/test results, encounter notes, upcoming appointments, etc.  Non-urgent messages can be sent to your provider as well.   To learn more about what you can do with MyChart, go to [email or web address].    Your next appointment:   2 month(s)  The format for your next appointment:   In Person  Provider:   Buford Dresser, MD   On October 6, we will check an ECG. If you are still in afib, we will arrange for cardioversion. Do not miss any doses of apixaban in the meantime.

## 2020-06-28 NOTE — Progress Notes (Signed)
Cardiology Office Note:    Date:  06/28/2020   ID:  Ronald Lowery, Ronald Lowery [birth year], MRN [id number]  PCP:  Burnard Bunting, MD  Cardiologist:  Buford Dresser, MD  Referring MD: Burnard Bunting, MD   CC: follow up  History of Present Illness:    Ronald Lowery is a 79 y.o. male with a hx of hypertension, hyperlipidemia, hypothyroidism, bladder cancer who is seen for follow up today. I initially saw him 03/16/20 as a new consult at the request of Burnard Bunting, MD for the evaluation and management of abnormal ECG.  Today: Recently hospitalized with UTI. EMS noted atrial fibrillation with RVR en route. During that hospitalization, started on apixaban 5 mg BID. Metoprolol also increased. Noted that while he has chronic hypothyroidism, his labs at the time were more consistent with hyperthyroidism. Has pending bloodwork with PCP next week.   They are requesting to come off of both apixaban and metoprolol. Feels like he is a zombie on the metoprolol, and apixaban is very expensive. No major bleeding issues, reviewed today. Wants to switch to coumadin after discussion.  Brings BP and HR logs today. BP range 80/49-121/97, HR 66-81.  ECG today is atrial fibrillation at 75 bpm.  Discussed antiarrhythmics, cardioversion at length today, see below.  Denies chest pain, shortness of breath at rest or with normal exertion. No PND, orthopnea, LE edema or unexpected weight gain. No syncope or palpitations.  Past Medical History:  Diagnosis Date  . Anxiety   . Arthritis   . Basal cell carcinoma 2004   x1-face  . Cataract    bilateral-may be removed 05/2015  . Complication of anesthesia    "took a while to wake up with triple hernia repair"  . DDD (degenerative disc disease), lumbar    L5-steroid injection 01/2015  . Depression   . Environmental allergies   . GERD (gastroesophageal reflux disease)   . Hematuria   . History of bladder cancer   . History of hiatal hernia   . History  of prostate cancer   . History of skin cancer   . Hyperlipidemia   . Hypertension   . Hypothyroidism   . Nocturia   . Numbness    4TH / 5TH FINGER TIPS  . Prostate cancer ([place])   . Scoliosis   . Squamous carcinoma 2008   x2- neck, face  . Urgency of urination   . Varicose veins     Past Surgical History:  Procedure Laterality Date  . APPENDECTOMY  1955  . CATARACT EXTRACTION    . CYSTOSCOPY N/A 12/19/2015   Procedure: CYSTOSCOPY;  Surgeon: Raynelle Bring, MD;  Location: [facility];  Service: Urology;  Laterality: N/A;  . CYSTOSCOPY W/ RETROGRADES Bilateral 05/09/2015   Procedure: CYSTOSCOPY WITH LEFT URETERAL CANNULATION;  Surgeon: Raynelle Bring, MD;  Location: [facility];  Service: Urology;  Laterality: Bilateral;  . CYSTOSCOPY W/ RETROGRADES Bilateral 05/04/2016   Procedure: CYSTOSCOPY WITH RETROGRADE PYELOGRAM;  Surgeon: Raynelle Bring, MD;  Location: [facility];  Service: Urology;  Laterality: Bilateral;  . ESOPHAGOGASTRODUODENOSCOPY (EGD) WITH ESOPHAGEAL DILATION  2004  . EYE SURGERY     cataracts with lens implants bilateral  . FOOT SURGERY  1980's   Morton's neuroma x3  . HERNIA REPAIR     triple laproscopic  . LYMPHADENECTOMY Bilateral 06/28/2016   Procedure: PELVIC LYMPHADENECTOMY;  Surgeon: Raynelle Bring, MD;  Location: [facility];  Service: Urology;  Laterality: Bilateral;  . MASS BIOPSY     bladder tumor biopsy  .  PROSTATECTOMY  2010  . ROBOT ASSISTED LAPAROSCOPIC COMPLETE CYSTECT ILEAL CONDUIT N/A 06/28/2016   Procedure: XI ROBOTIC ASSISTED LAPAROSCOPIC COMPLETE CYSTECT ILEAL CONDUIT;  Surgeon: Raynelle Bring, MD;  Location: [facility];  Service: Urology;  Laterality: N/A;  . SKIN CANCER DESTRUCTION  2004   basal cell carcinoma  . TONSILLECTOMY  ~1949  . TOTAL HIP ARTHROPLASTY Left 08/16/2015   Procedure: LEFT TOTAL HIP ARTHROPLASTY ANTERIOR APPROACH;  Surgeon: Paralee Cancel, MD;  Location: [facility];  Service: Orthopedics;  Laterality: Left;  . TRANSURETHRAL RESECTION OF BLADDER TUMOR N/A  05/09/2015   Procedure: TRANSURETHRAL RESECTION OF BLADDER TUMOR (TURBT);  Surgeon: Raynelle Bring, MD;  Location[facility];  Service: Urology;  Laterality: N/A;  . TRANSURETHRAL RESECTION OF BLADDER TUMOR N/A 11/14/2015   Procedure: TRANSURETHRAL RESECTION OF BLADDER TUMOR (TURBT);  Surgeon: Raynelle Bring, MD;  Location[facility];  Service: Urology;  Laterality: N/A;  . TRANSURETHRAL RESECTION OF BLADDER TUMOR N/A 12/19/2015   Procedure: TRANSURETHRAL RESECTION OF BLADDER TUMOR (TURBT);  Surgeon: Raynelle Bring, MD;  [facility];  Service: Urology;  Laterality: N/A;  . TRANSURETHRAL RESECTION OF BLADDER TUMOR N/A 05/04/2016   Procedure: TRANSURETHRAL RESECTION OF BLADDER TUMOR (TURBT);  Surgeon: Raynelle Bring, MD;  Location[facility];  Service: Urology;  Laterality: N/A;  . VEIN SURGERY Left 2011   leg    Current Medications: Current Outpatient Medications on File Prior to Visit  Medication Sig  . apixaban (ELIQUIS) 5 MG TABS tablet Take 1 tablet (5 mg total) by mouth 2 (two) times daily.  Marland Kitchen gabapentin (NEURONTIN) 100 MG capsule Take 200 mg by mouth at bedtime.   . metoprolol tartrate (LOPRESSOR) 100 MG tablet Take 1 tablet (100 mg total) by mouth 2 (two) times daily.  Marland Kitchen zolpidem (AMBIEN) 10 MG tablet Take 5 mg by mouth at bedtime.    No current facility-administered medications on file prior to visit.     Allergies:   Cefdinir and Myrbetriq [mirabegron]   Social History   Tobacco Use  . Smoking status: Never Smoker  . Smokeless tobacco: Never Used  Substance Use Topics  . Alcohol use: No  . Drug use: No    Family History: family history includes Colon cancer in his maternal aunt. There is no history of Esophageal cancer, Stomach cancer, or Rectal cancer. not sure about his mother's heart health, but none he is aware of, and she passed away in her 90s. Father died in his 27s of melanoma. No heart disease in brother or sister.  ROS:   Please see the history of present illness.   Additional pertinent ROS otherwise unremarkable.  EKGs/Labs/Other Studies Reviewed:    The following studies were reviewed today: Echo 04/05/20 1. Pt appears to be in atrial flutter during the study; low normal LV  function; mildly dilated aortic root; moderate RAE; mild RVE.  2. Left ventricular ejection fraction, by estimation, is 50 to 55%. The  left ventricle has low normal function. The left ventricle has no regional  wall motion abnormalities. Left ventricular diastolic parameters are  indeterminate.  3. Right ventricular systolic function is normal. The right ventricular  size is mildly enlarged. Tricuspid regurgitation signal is inadequate for  assessing PA pressure.  4. Right atrial size was moderately dilated.  5. The mitral valve is normal in structure. Trivial mitral valve  regurgitation. No evidence of mitral stenosis.  6. The aortic valve is tricuspid. Aortic valve regurgitation is not  visualized. Mild aortic valve sclerosis is present, with  no evidence of  aortic valve stenosis.  7. There is mild dilatation of the aortic root measuring 40 mm.  8. The inferior vena cava is dilated in size with >50% respiratory  variability, suggesting right atrial pressure of 8 mmHg.   Monitor 05/23/20 7 days of data recorded on Zio monitor. Patient had a min HR of 61 bpm, max HR of 154 bpm, and avg HR of 107 bpm. Predominant underlying rhythm was atrial flutter (~100% burden). No VT, SVT, atrial fibrillation, high degree block, or pauses noted. Isolated ventricular ectopy was occasional (1.5%). There were 0 triggered events. Atrial flutter throughout study with variable conduction.  EKG:  EKG is personally reviewed.  The ekg ordered today demonstrates atrial fibrillation at 75 bpm  Recent Labs: 03/10/2020: ALT 13 05/30/2020: B Natriuretic Peptide 392.5; Magnesium 1.8; TSH 0.110 06/02/2020: BUN 17; Creatinine, Ser 0.87; Hemoglobin 12.4; Platelets 224; Potassium 4.1; Sodium 129    Recent Lipid Panel No results found for: CHOL, TRIG, HDL, CHOLHDL, VLDL, LDLCALC, LDLDIRECT  Physical Exam:    VS:  BP 100/60   Pulse 75   Temp (!) 94.8 F (34.9 C)   Ht 6\' 3"  (1.905 m)   Wt 172 lb 9.6 oz (78.3 kg)   SpO2 (!) 87%   BMI 21.57 kg/m     Wt Readings from Last 3 Encounters:  06/28/20 172 lb 9.6 oz (78.3 kg)  05/31/20 175 lb 14.4 oz (79.8 kg)  03/16/20 179 lb 3.2 oz (81.3 kg)    GEN: Well nourished, well developed in no acute distress HEENT: Normal, moist mucous membranes NECK: No JVD CARDIAC: irregular irregular rhythm, normal S1 and S2, no rubs or gallops. No murmur. VASCULAR: Radial and DP pulses 2+ bilaterally. No carotid bruits RESPIRATORY:  Clear to auscultation without rales, wheezing or rhonchi  ABDOMEN: Soft, non-tender, non-distended MUSCULOSKELETAL:  Ambulates independently SKIN: Warm and dry, no edema NEUROLOGIC:  Alert and oriented x 3. No focal neuro deficits noted. PSYCHIATRIC:  Normal affect   ASSESSMENT:    1. Persistent atrial fibrillation ([place])   2. Hypotension, unspecified hypotension type   3. Fatigue, unspecified type   4. Long term (current) use of anticoagulants   5. Cardiac risk counseling    PLAN:    Atrial fibrillation/atrial flutter: now persistent -they feel like metoprolol is making him a zombie. Want to stop. Discussed rate vs. Rhythm control at length today, cardioversion vs medications, etc -after shared decision making, will start amiodarone. Will decrease metoprolol given symptoms. Discussed PRN additional metoprolol for tachycardia -will follow up after amiodarone load to evaluate if cardioversion needed.  -CHA2DS2/VAS Stroke Risk Points = 3  -we discussed anticoagulation at length today. They have not had bleeding issues. They do not want to continue apixaban long term due to expense. -we will try to get samples for now to not make too many changes at the same time. Once he is on a stable dose of  amiodarone  Hypotension, fatigue: -as above, will aim to restore sinus rhythm and cut back on metoprolol  Cardiac risk counseling and prevention recommendations: -recommend heart healthy/Mediterranean diet, with whole grains, fruits, vegetable, fish, lean meats, nuts, and olive oil. Limit salt. -recommend moderate walking, 3-5 times/week for 30-50 minutes each session. Aim for at least 150 minutes.week. Goal should be pace of 3 miles/hours, or walking 1.5 miles in 30 minutes -recommend avoidance of tobacco products. Avoid excess alcohol.  Plan for follow up: 2 mos  Buford Dresser, MD, PhD [facility]  HeartCare    Medication Adjustments/Labs and Tests Ordered: Current medicines are reviewed at length with the patient today.  Concerns regarding medicines are outlined above.  Orders Placed This Encounter  Procedures  . EKG 12-Lead   Meds ordered this encounter  Medications  . metoprolol tartrate (LOPRESSOR) 25 MG tablet    Sig: Take 2 tablets (50 mg total) by mouth 2 (two) times daily.    Dispense:  180 tablet    Refill:  3  . amiodarone (PACERONE) 200 MG tablet    Sig: Take 1 tablet (200 mg total) by mouth 2 (two) times daily for 14 days, THEN 1 tablet (200 mg total) daily.    Dispense:  104 tablet    Refill:  0    Patient Instructions  Medication Instructions:  -Start amiodarone. You will take 200 mg twice a day for two weeks, then go to 200 mg daily.  -When you start the amiodarone, you can cut the metoprolol dose in half (will be 50 mg twice a day from 100 mg twice a day). - Continue the apixaban for now, we will discuss changing to coumadin in the future.   If your heart rate is more than 110 bpm sitting down/resting, you can take an additional 1/2 pill (50 mg) of metoprolol tartrate.  *If you need a refill on your cardiac medications before your next appointment, please call your pharmacy*   Lab Work: None ordered    Testing/Procedures: None  ordered    Follow-Up: At [facility], you and your health needs are our priority.  As part of our continuing mission to provide you with exceptional heart care, we have created designated Provider Care Teams.  These Care Teams include your primary Cardiologist (physician) and Advanced Practice Providers (APPs -  Physician Assistants and Nurse Practitioners) who all work together to provide you with the care you need, when you need it.  We recommend signing up for the patient portal called "MyChart".  Sign up information is provided on this After Visit Summary.  MyChart is used to connect with patients for Virtual Visits (Telemedicine).  Patients are able to view lab/test results, encounter notes, upcoming appointments, etc.  Non-urgent messages can be sent to your provider as well.   To learn more about what you can do with MyChart, go to [email or web address].    Your next appointment:   2 month(s)  The format for your next appointment:   In Person  Provider:   Buford Dresser, MD   On October 6, we will check an ECG. If you are still in afib, we will arrange for cardioversion. Do not miss any doses of apixaban in the meantime.     Signed, Buford Dresser, MD PhD 06/28/2020    [place]

## 2020-06-29 ENCOUNTER — Other Ambulatory Visit: Payer: Self-pay | Admitting: Cardiology

## 2020-06-29 NOTE — Telephone Encounter (Signed)
*  STAT* If patient is at the pharmacy, call can be transferred to refill team.   1. Which medications need to be refilled? (please list name of each medication and dose if known) metoprolol tartrate (LOPRESSOR) 25 MG tablet  2. Which pharmacy/location (including street and city if local pharmacy) is medication to be sent to? [place], [place]  3. Do they need a 30 day or 90 day supply? 30 day supply

## 2020-06-30 ENCOUNTER — Other Ambulatory Visit: Payer: Self-pay | Admitting: Cardiology

## 2020-06-30 NOTE — Telephone Encounter (Signed)
*  STAT* If patient is at the pharmacy, call can be transferred to refill team.   1. Which medications need to be refilled? (please list name of each medication and dose if known)  metoprolol tartrate (LOPRESSOR) 25 MG tablet  2. Which pharmacy/location (including street and city if local pharmacy) is medication to be sent to? [place], [place]  3. Do they need a 30 day or 90 day supply? [place]

## 2020-07-04 DIAGNOSIS — I10 Essential (primary) hypertension: Secondary | ICD-10-CM | POA: Diagnosis not present

## 2020-07-12 ENCOUNTER — Telehealth: Payer: Self-pay | Admitting: Cardiology

## 2020-07-12 NOTE — Telephone Encounter (Signed)
Returned call to [place] at [facility] had appt 9/1 and she wanted to make sure he had followed up with Dr. Harrell Gave about his metoprolol making him feel sleepy.  Advised patient had appt 9/14 with MD and metoprolol dosage was changed due to symptoms.  Nothing further needed.    OV note 9/14 Atrial fibrillation/atrial flutter: now persistent -they feel like metoprolol is making him a zombie. Want to stop. Discussed rate vs. Rhythm control at length today, cardioversion vs medications, etc -after shared decision making, will start amiodarone. Will decrease metoprolol given symptoms. Discussed PRN additional metoprolol for tachycardia -will follow up after amiodarone load to evaluate if cardioversion needed.  -CHA2DS2/VAS Stroke Risk Points = 3

## 2020-07-12 NOTE — Telephone Encounter (Signed)
Pt c/o medication issue:  1. Name of Medication:   metoprolol tartrate (LOPRESSOR) 25 MG tablet     2. How are you currently taking this medication (dosage and times per day)? As directed  3. Are you having a reaction (difficulty breathing--STAT)? No  4. What is your medication issue? Anderson Malta with [facility] states the medication is causing the patient to become extremely tired and fatigued. Anderson Malta would like for this medication to be dicussed during nurse visit scheduled for 07/18/20.

## 2020-07-13 DIAGNOSIS — Z125 Encounter for screening for malignant neoplasm of prostate: Secondary | ICD-10-CM | POA: Diagnosis not present

## 2020-07-13 DIAGNOSIS — I10 Essential (primary) hypertension: Secondary | ICD-10-CM | POA: Diagnosis not present

## 2020-07-13 DIAGNOSIS — E039 Hypothyroidism, unspecified: Secondary | ICD-10-CM | POA: Diagnosis not present

## 2020-07-13 DIAGNOSIS — E785 Hyperlipidemia, unspecified: Secondary | ICD-10-CM | POA: Diagnosis not present

## 2020-07-18 ENCOUNTER — Ambulatory Visit: Payer: Medicare Other

## 2020-07-20 ENCOUNTER — Ambulatory Visit: Payer: Medicare Other

## 2020-07-20 DIAGNOSIS — Z23 Encounter for immunization: Secondary | ICD-10-CM | POA: Diagnosis not present

## 2020-07-20 DIAGNOSIS — I10 Essential (primary) hypertension: Secondary | ICD-10-CM | POA: Diagnosis not present

## 2020-07-20 DIAGNOSIS — I4891 Unspecified atrial fibrillation: Secondary | ICD-10-CM | POA: Diagnosis not present

## 2020-07-20 DIAGNOSIS — G3184 Mild cognitive impairment, so stated: Secondary | ICD-10-CM | POA: Diagnosis not present

## 2020-07-20 DIAGNOSIS — Z0000 Encounter for general adult medical examination without abnormal findings: Secondary | ICD-10-CM | POA: Diagnosis not present

## 2020-07-20 DIAGNOSIS — Z936 Other artificial openings of urinary tract status: Secondary | ICD-10-CM | POA: Diagnosis not present

## 2020-07-20 DIAGNOSIS — K219 Gastro-esophageal reflux disease without esophagitis: Secondary | ICD-10-CM | POA: Diagnosis not present

## 2020-07-20 DIAGNOSIS — E785 Hyperlipidemia, unspecified: Secondary | ICD-10-CM | POA: Diagnosis not present

## 2020-07-20 DIAGNOSIS — Z1339 Encounter for screening examination for other mental health and behavioral disorders: Secondary | ICD-10-CM | POA: Diagnosis not present

## 2020-07-20 DIAGNOSIS — E039 Hypothyroidism, unspecified: Secondary | ICD-10-CM | POA: Diagnosis not present

## 2020-07-20 DIAGNOSIS — Z1331 Encounter for screening for depression: Secondary | ICD-10-CM | POA: Diagnosis not present

## 2020-08-04 DIAGNOSIS — Z23 Encounter for immunization: Secondary | ICD-10-CM | POA: Diagnosis not present

## 2020-08-04 DIAGNOSIS — Z1212 Encounter for screening for malignant neoplasm of rectum: Secondary | ICD-10-CM | POA: Diagnosis not present

## 2020-08-24 ENCOUNTER — Other Ambulatory Visit: Payer: Self-pay | Admitting: Cardiology

## 2020-08-24 ENCOUNTER — Other Ambulatory Visit: Payer: Self-pay

## 2020-08-24 MED ORDER — APIXABAN 5 MG PO TABS: 5.0000 mg | ORAL_TABLET | Freq: Two times a day (BID) | ORAL | 1 refills | Status: DC

## 2020-08-24 NOTE — Telephone Encounter (Signed)
*  STAT* If patient is at the pharmacy, call can be transferred to refill team.   1. Which medications need to be refilled? (please list name of each medication and dose if known)  apixaban (ELIQUIS) 5 MG TABS tablet(Expired)  2. Which pharmacy/location (including street and city if local pharmacy) is medication to be sent to? [place], [place]  3. Do they need a 30 day or 90 day supply? 30 day supply

## 2020-08-30 ENCOUNTER — Encounter: Payer: Self-pay | Admitting: Cardiology

## 2020-08-30 ENCOUNTER — Ambulatory Visit (INDEPENDENT_AMBULATORY_CARE_PROVIDER_SITE_OTHER): Payer: Medicare Other | Admitting: Cardiology

## 2020-08-30 ENCOUNTER — Other Ambulatory Visit: Payer: Self-pay

## 2020-08-30 VITALS — BP 130/80 | HR 58 | Ht 75.5 in | Wt 167.2 lb

## 2020-08-30 DIAGNOSIS — Z7189 Other specified counseling: Secondary | ICD-10-CM | POA: Diagnosis not present

## 2020-08-30 DIAGNOSIS — Z7901 Long term (current) use of anticoagulants: Secondary | ICD-10-CM | POA: Diagnosis not present

## 2020-08-30 DIAGNOSIS — R5383 Other fatigue: Secondary | ICD-10-CM | POA: Diagnosis not present

## 2020-08-30 DIAGNOSIS — I480 Paroxysmal atrial fibrillation: Secondary | ICD-10-CM

## 2020-08-30 MED ORDER — AMIODARONE HCL 200 MG PO TABS: 200.0000 mg | ORAL_TABLET | Freq: Every day | ORAL | 3 refills | Status: DC

## 2020-08-30 MED ORDER — APIXABAN 5 MG PO TABS: 5.0000 mg | ORAL_TABLET | Freq: Two times a day (BID) | ORAL | 3 refills | Status: DC

## 2020-08-30 NOTE — Progress Notes (Signed)
Cardiology Office Note:    Date:  08/30/2020   ID:  Ronald Lowery, Ronald Lowery 1940-12-22, MRN [id number]  PCP:  Burnard Bunting, MD  Cardiologist:  Buford Dresser, MD  Referring MD: Burnard Bunting, MD   CC: follow up  History of Present Illness:    Ronald Lowery is a 79 y.o. male with a hx of hypertension, hyperlipidemia, hypothyroidism, bladder cancer, atrial fibrillation who is seen for follow up today. I initially saw him 03/16/20 as a new consult at the request of Burnard Bunting, MD for the evaluation and management of abnormal ECG.  Today: In sinus bradycardia today. Still feels fatigued, though slightly better than when he was on the metoprolol. Falls asleep easily. Feels that appetite is good, but weight downtrending without attempt at weight loss. Walks nearly every day through his neighborhood. Was walking alone 10/16 and stumbled, scratched his face but no major bleeding issues. Hasn't walked alone since.   Dr. [name] restarted levothyroxine, pending recheck for this soon.  Main concern is that his memory seems to be getting worse.  Blood pressure better, no issues.   Denies chest pain, shortness of breath at rest or with normal exertion. No PND, orthopnea, LE edema or unexpected weight gain. No syncope or palpitations.  Past Medical History:  Diagnosis Date  . Anxiety   . Arthritis   . Basal cell carcinoma 2004   x1-face  . Cataract    bilateral-may be removed 05/2015  . Complication of anesthesia    "took a while to wake up with triple hernia repair"  . DDD (degenerative disc disease), lumbar    L5-steroid injection 01/2015  . Depression   . Environmental allergies   . GERD (gastroesophageal reflux disease)   . Hematuria   . History of bladder cancer   . History of hiatal hernia   . History of prostate cancer   . History of skin cancer   . Hyperlipidemia   . Hypertension   . Hypothyroidism   . Nocturia   . Numbness    4TH / 5TH FINGER TIPS  .  Prostate cancer ([place])   . Scoliosis   . Squamous carcinoma 2008   x2- neck, face  . Urgency of urination   . Varicose veins     Past Surgical History:  Procedure Laterality Date  . APPENDECTOMY  1955  . CATARACT EXTRACTION    . CYSTOSCOPY N/A 12/19/2015   Procedure: CYSTOSCOPY;  Surgeon: Raynelle Bring, MD;  Location: [facility];  Service: Urology;  Laterality: N/A;  . CYSTOSCOPY W/ RETROGRADES Bilateral 05/09/2015   Procedure: CYSTOSCOPY WITH LEFT URETERAL CANNULATION;  Surgeon: Raynelle Bring, MD;  Location: [facility];  Service: Urology;  Laterality: Bilateral;  . CYSTOSCOPY W/ RETROGRADES Bilateral 05/04/2016   Procedure: CYSTOSCOPY WITH RETROGRADE PYELOGRAM;  Surgeon: Raynelle Bring, MD;  Location: [facility];  Service: Urology;  Laterality: Bilateral;  . ESOPHAGOGASTRODUODENOSCOPY (EGD) WITH ESOPHAGEAL DILATION  2004  . EYE SURGERY     cataracts with lens implants bilateral  . FOOT SURGERY  1980's   Morton's neuroma x3  . HERNIA REPAIR     triple laproscopic  . LYMPHADENECTOMY Bilateral 06/28/2016   Procedure: PELVIC LYMPHADENECTOMY;  Surgeon: Raynelle Bring, MD;  Location: [facility];  Service: Urology;  Laterality: Bilateral;  . MASS BIOPSY     bladder tumor biopsy  . PROSTATECTOMY  2010  . ROBOT ASSISTED LAPAROSCOPIC COMPLETE CYSTECT ILEAL CONDUIT N/A 06/28/2016   Procedure: XI ROBOTIC ASSISTED LAPAROSCOPIC COMPLETE CYSTECT ILEAL CONDUIT;  Surgeon: Raynelle Bring, MD;  Location: [facility];  Service: Urology;  Laterality: N/A;  . SKIN CANCER DESTRUCTION  2004   basal cell carcinoma  . TONSILLECTOMY  ~1949  . TOTAL HIP ARTHROPLASTY Left 08/16/2015   Procedure: LEFT TOTAL HIP ARTHROPLASTY ANTERIOR APPROACH;  Surgeon: Paralee Cancel, MD;  Location: [facility];  Service: Orthopedics;  Laterality: Left;  . TRANSURETHRAL RESECTION OF BLADDER TUMOR N/A 05/09/2015   Procedure: TRANSURETHRAL RESECTION OF BLADDER TUMOR (TURBT);  Surgeon: Raynelle Bring, MD;  Location: [facility];  Service: Urology;  Laterality: N/A;  .  TRANSURETHRAL RESECTION OF BLADDER TUMOR N/A 11/14/2015   Procedure: TRANSURETHRAL RESECTION OF BLADDER TUMOR (TURBT);  Surgeon: Raynelle Bring, MD;  Location: [facility];  Service: Urology;  Laterality: N/A;  . TRANSURETHRAL RESECTION OF BLADDER TUMOR N/A 12/19/2015   Procedure: TRANSURETHRAL RESECTION OF BLADDER TUMOR (TURBT);  Surgeon: Raynelle Bring, MD;  Location: [facility];  Service: Urology;  Laterality: N/A;  . TRANSURETHRAL RESECTION OF BLADDER TUMOR N/A 05/04/2016   Procedure: TRANSURETHRAL RESECTION OF BLADDER TUMOR (TURBT);  Surgeon: Raynelle Bring, MD;  Location: [facility];  Service: Urology;  Laterality: N/A;  . VEIN SURGERY Left 2011   leg    Current Medications: Current Outpatient Medications on File Prior to Visit  Medication Sig  . amiodarone (PACERONE) 200 MG tablet Take 1 tablet (200 mg total) by mouth 2 (two) times daily for 14 days, THEN 1 tablet (200 mg total) daily.  Marland Kitchen apixaban (ELIQUIS) 5 MG TABS tablet Take 1 tablet (5 mg total) by mouth 2 (two) times daily.  Marland Kitchen gabapentin (NEURONTIN) 100 MG capsule Take 200 mg by mouth at bedtime.   Marland Kitchen levothyroxine (SYNTHROID) 25 MCG tablet Take 25 mcg by mouth every morning.  . zolpidem (AMBIEN) 10 MG tablet Take 5 mg by mouth at bedtime.    No current facility-administered medications on file prior to visit.     Allergies:   Cefdinir and Myrbetriq [mirabegron]   Social History   Tobacco Use  . Smoking status: Never Smoker  . Smokeless tobacco: Never Used  Substance Use Topics  . Alcohol use: No  . Drug use: No    Family History: family history includes Colon cancer in his maternal aunt. There is no history of Esophageal cancer, Stomach cancer, or Rectal cancer. not sure about his mother's heart health, but none he is aware of, and she passed away in her 90s. Father died in his 80s of melanoma. No heart disease in brother or sister.  ROS:   Please see the history of present illness.  Additional pertinent ROS otherwise  unremarkable.  EKGs/Labs/Other Studies Reviewed:    The following studies were reviewed today: Echo 04/05/20 1. Pt appears to be in atrial flutter during the study; low normal LV  function; mildly dilated aortic root; moderate RAE; mild RVE.  2. Left ventricular ejection fraction, by estimation, is 50 to 55%. The  left ventricle has low normal function. The left ventricle has no regional  wall motion abnormalities. Left ventricular diastolic parameters are  indeterminate.  3. Right ventricular systolic function is normal. The right ventricular  size is mildly enlarged. Tricuspid regurgitation signal is inadequate for  assessing PA pressure.  4. Right atrial size was moderately dilated.  5. The mitral valve is normal in structure. Trivial mitral valve  regurgitation. No evidence of mitral stenosis.  6. The aortic valve is tricuspid. Aortic valve regurgitation is not  visualized. Mild aortic valve sclerosis is present, with no evidence of  aortic valve stenosis.  7. There is mild dilatation of the aortic root measuring 40 mm.  8. The inferior vena cava is dilated in size with >50% respiratory  variability, suggesting right atrial pressure of 8 mmHg.   Monitor 05/23/20 7 days of data recorded on Zio monitor. Patient had a min HR of 61 bpm, max HR of 154 bpm, and avg HR of 107 bpm. Predominant underlying rhythm was atrial flutter (~100% burden). No VT, SVT, atrial fibrillation, high degree block, or pauses noted. Isolated ventricular ectopy was occasional (1.5%). There were 0 triggered events. Atrial flutter throughout study with variable conduction.  EKG:  EKG is personally reviewed.  The ekg ordered today demonstrates sinus bradycardia at 58 bpm  Recent Labs: 03/10/2020: ALT 13 05/30/2020: B Natriuretic Peptide 392.5; Magnesium 1.8; TSH 0.110 06/02/2020: BUN 17; Creatinine, Ser 0.87; Hemoglobin 12.4; Platelets 224; Potassium 4.1; Sodium 129  Recent Lipid Panel No results found for:  CHOL, TRIG, HDL, CHOLHDL, VLDL, LDLCALC, LDLDIRECT  Physical Exam:    VS:  BP 130/80   Pulse (!) 58   Ht 6' 3.5" (1.918 m)   Wt 167 lb 3.2 oz (75.8 kg)   BMI 20.62 kg/m     Wt Readings from Last 3 Encounters:  08/30/20 167 lb 3.2 oz (75.8 kg)  06/28/20 172 lb 9.6 oz (78.3 kg)  05/31/20 175 lb 14.4 oz (79.8 kg)    GEN: Well nourished, well developed in no acute distress HEENT: Normal, moist mucous membranes NECK: No JVD CARDIAC: regular rhythm, normal S1 and S2, no rubs or gallops. No murmur. VASCULAR: Radial and DP pulses 2+ bilaterally. No carotid bruits RESPIRATORY:  Clear to auscultation without rales, wheezing or rhonchi  ABDOMEN: Soft, non-tender, non-distended MUSCULOSKELETAL:  Ambulates independently SKIN: Warm and dry, no edema NEUROLOGIC:  Alert and oriented x 3. No focal neuro deficits noted. PSYCHIATRIC:  Normal affect   ASSESSMENT:    1. Fatigue, unspecified type   2. Paroxysmal atrial fibrillation (HCC)   3. Long term (current) use of anticoagulants   4. Cardiac risk counseling   5. Counseling on health promotion and disease prevention    PLAN:    Atrial fibrillation/atrial flutter, paroxysmal -now in sinus bradycardia today -feels better on amiodarone than on metoprolol -CHA2DS2/VAS Stroke Risk Points = 3  -continue apixaban, need to follow if cost makes this prohibitive -with amiodarone, will need to follow labs for monitoring. Has hypothyroidism, being managed by Dr. [name]  Hypotension, fatigue: -somewhat improved since stopping metoprolol  Cardiac risk counseling and prevention recommendations: -recommend heart healthy/Mediterranean diet, with whole grains, fruits, vegetable, fish, lean meats, nuts, and olive oil. Limit salt. -recommend moderate walking, 3-5 times/week for 30-50 minutes each session. Aim for at least 150 minutes.week. Goal should be pace of 3 miles/hours, or walking 1.5 miles in 30 minutes -recommend avoidance of tobacco products.  Avoid excess alcohol.  Plan for follow up: 6 mos, do monitoring labs at that time if not recently done  Buford Dresser, MD, PhD [place]  [facility]    Medication Adjustments/Labs and Tests Ordered: Current medicines are reviewed at length with the patient today.  Concerns regarding medicines are outlined above.  Orders Placed This Encounter  Procedures  . EKG 12-Lead   Meds ordered this encounter  Medications  . amiodarone (PACERONE) 200 MG tablet    Sig: Take 1 tablet (200 mg total) by mouth daily for 14 days.    Dispense:  90 tablet    Refill:  3  .  apixaban (ELIQUIS) 5 MG TABS tablet    Sig: Take 1 tablet (5 mg total) by mouth 2 (two) times daily.    Dispense:  180 tablet    Refill:  3    Patient Instructions  Medication Instructions:  Your Physician recommend you continue on your current medication as directed.    *If you need a refill on your cardiac medications before your next appointment, please call your pharmacy*   Lab Work: None   Testing/Procedures: None   Follow-Up: At [facility], you and your health needs are our priority.  As part of our continuing mission to provide you with exceptional heart care, we have created designated Provider Care Teams.  These Care Teams include your primary Cardiologist (physician) and Advanced Practice Providers (APPs -  Physician Assistants and Nurse Practitioners) who all work together to provide you with the care you need, when you need it.  We recommend signing up for the patient portal called "MyChart".  Sign up information is provided on this After Visit Summary.  MyChart is used to connect with patients for Virtual Visits (Telemedicine).  Patients are able to view lab/test results, encounter notes, upcoming appointments, etc.  Non-urgent messages can be sent to your provider as well.   To learn more about what you can do with MyChart, go to [email or web address].    Your next appointment:   6  month(s)  The format for your next appointment:   In Person  Provider:   Buford Dresser, MD       Signed, Buford Dresser, MD PhD 08/30/2020    [place]

## 2020-08-30 NOTE — Patient Instructions (Signed)

## 2020-09-01 DIAGNOSIS — E039 Hypothyroidism, unspecified: Secondary | ICD-10-CM | POA: Diagnosis not present

## 2020-10-25 ENCOUNTER — Telehealth: Payer: Self-pay | Admitting: Cardiology

## 2020-10-25 NOTE — Telephone Encounter (Signed)
New Message:     Wife is calling, she wants to know if there is a generic he can use in the place of his Eliquis? She says the Eliquis is too expensive.

## 2020-10-25 NOTE — Telephone Encounter (Signed)
Wife updated and voiced she would appreciate input from PharmD as they can longer afford Eliquis.   Will forward for recommendations.

## 2020-10-25 NOTE — Telephone Encounter (Signed)
Called to speak with wife and/or patient.  No current prescription card in Epic.  Called patient to update.  No answer, no voicemail picked up

## 2020-10-25 NOTE — Telephone Encounter (Signed)
There is unfortunately no generic for any of the newer blood thinners (eliquis, xarelto, pradaxa, etc). The only generic alternative is coumadin, which has much more bleeding risk and doesn't protect against stroke as well. It also requires frequent monitoring and can be affected by diet. Can we ask the PharmD team to see if any of the other direct oral anticoagulants would be cheaper for him? Thanks

## 2020-10-26 NOTE — Telephone Encounter (Signed)
Left message to call back  

## 2020-11-09 DIAGNOSIS — Z8546 Personal history of malignant neoplasm of prostate: Secondary | ICD-10-CM | POA: Diagnosis not present

## 2020-11-09 DIAGNOSIS — D519 Vitamin B12 deficiency anemia, unspecified: Secondary | ICD-10-CM | POA: Diagnosis not present

## 2020-11-11 DIAGNOSIS — Z8551 Personal history of malignant neoplasm of bladder: Secondary | ICD-10-CM | POA: Diagnosis not present

## 2020-11-11 DIAGNOSIS — K5641 Fecal impaction: Secondary | ICD-10-CM | POA: Diagnosis not present

## 2020-11-11 DIAGNOSIS — D090 Carcinoma in situ of bladder: Secondary | ICD-10-CM | POA: Diagnosis not present

## 2020-11-18 DIAGNOSIS — Z8551 Personal history of malignant neoplasm of bladder: Secondary | ICD-10-CM | POA: Diagnosis not present

## 2020-11-18 DIAGNOSIS — Z8546 Personal history of malignant neoplasm of prostate: Secondary | ICD-10-CM | POA: Diagnosis not present

## 2020-12-04 ENCOUNTER — Encounter: Payer: Self-pay | Admitting: Cardiology

## 2021-01-18 ENCOUNTER — Other Ambulatory Visit: Payer: Self-pay | Admitting: Internal Medicine

## 2021-01-18 DIAGNOSIS — G3184 Mild cognitive impairment, so stated: Secondary | ICD-10-CM | POA: Diagnosis not present

## 2021-01-18 DIAGNOSIS — I4891 Unspecified atrial fibrillation: Secondary | ICD-10-CM | POA: Diagnosis not present

## 2021-01-18 DIAGNOSIS — R413 Other amnesia: Secondary | ICD-10-CM | POA: Diagnosis not present

## 2021-01-18 DIAGNOSIS — K219 Gastro-esophageal reflux disease without esophagitis: Secondary | ICD-10-CM | POA: Diagnosis not present

## 2021-01-18 DIAGNOSIS — E785 Hyperlipidemia, unspecified: Secondary | ICD-10-CM | POA: Diagnosis not present

## 2021-01-18 DIAGNOSIS — E039 Hypothyroidism, unspecified: Secondary | ICD-10-CM | POA: Diagnosis not present

## 2021-01-18 DIAGNOSIS — R634 Abnormal weight loss: Secondary | ICD-10-CM | POA: Diagnosis not present

## 2021-01-18 DIAGNOSIS — Z936 Other artificial openings of urinary tract status: Secondary | ICD-10-CM | POA: Diagnosis not present

## 2021-01-18 DIAGNOSIS — I10 Essential (primary) hypertension: Secondary | ICD-10-CM | POA: Diagnosis not present

## 2021-01-20 DIAGNOSIS — H26492 Other secondary cataract, left eye: Secondary | ICD-10-CM | POA: Diagnosis not present

## 2021-01-20 DIAGNOSIS — H52203 Unspecified astigmatism, bilateral: Secondary | ICD-10-CM | POA: Diagnosis not present

## 2021-01-31 ENCOUNTER — Ambulatory Visit
Admission: RE | Admit: 2021-01-31 | Discharge: 2021-01-31 | Disposition: A | Discharge status: Home / Self Care | Payer: Medicare Other | Source: Ambulatory Visit | Attending: Internal Medicine | Admitting: Internal Medicine

## 2021-01-31 DIAGNOSIS — I700 Atherosclerosis of aorta: Secondary | ICD-10-CM | POA: Diagnosis not present

## 2021-01-31 DIAGNOSIS — Z8551 Personal history of malignant neoplasm of bladder: Secondary | ICD-10-CM | POA: Diagnosis not present

## 2021-01-31 DIAGNOSIS — R634 Abnormal weight loss: Secondary | ICD-10-CM | POA: Diagnosis not present

## 2021-01-31 DIAGNOSIS — K7689 Other specified diseases of liver: Secondary | ICD-10-CM | POA: Diagnosis not present

## 2021-01-31 MED ORDER — IOPAMIDOL (ISOVUE-300) INJECTION 61%
100.0000 mL | Freq: Once | INTRAVENOUS | Status: AC | PRN
  Administered 2021-01-31: 100 mL via INTRAVENOUS

## 2021-02-07 DIAGNOSIS — H26492 Other secondary cataract, left eye: Secondary | ICD-10-CM | POA: Diagnosis not present

## 2021-02-15 DIAGNOSIS — I4891 Unspecified atrial fibrillation: Secondary | ICD-10-CM | POA: Diagnosis not present

## 2021-02-15 DIAGNOSIS — I10 Essential (primary) hypertension: Secondary | ICD-10-CM | POA: Diagnosis not present

## 2021-02-15 DIAGNOSIS — E039 Hypothyroidism, unspecified: Secondary | ICD-10-CM | POA: Diagnosis not present

## 2021-02-15 DIAGNOSIS — R413 Other amnesia: Secondary | ICD-10-CM | POA: Diagnosis not present

## 2021-02-15 DIAGNOSIS — R634 Abnormal weight loss: Secondary | ICD-10-CM | POA: Diagnosis not present

## 2021-02-27 ENCOUNTER — Ambulatory Visit: Payer: Medicare Other | Admitting: Cardiology

## 2021-03-16 ENCOUNTER — Encounter: Payer: Self-pay | Admitting: Cardiology

## 2021-03-16 ENCOUNTER — Ambulatory Visit (INDEPENDENT_AMBULATORY_CARE_PROVIDER_SITE_OTHER): Payer: Medicare Other | Admitting: Cardiology

## 2021-03-16 ENCOUNTER — Other Ambulatory Visit: Payer: Self-pay

## 2021-03-16 VITALS — BP 124/82 | HR 60 | Ht 75.5 in | Wt 160.0 lb

## 2021-03-16 DIAGNOSIS — R5383 Other fatigue: Secondary | ICD-10-CM

## 2021-03-16 DIAGNOSIS — Z7189 Other specified counseling: Secondary | ICD-10-CM | POA: Diagnosis not present

## 2021-03-16 DIAGNOSIS — Z7901 Long term (current) use of anticoagulants: Secondary | ICD-10-CM

## 2021-03-16 DIAGNOSIS — I480 Paroxysmal atrial fibrillation: Secondary | ICD-10-CM

## 2021-03-16 NOTE — Patient Instructions (Signed)
Medication Instructions:  Your Physician recommend you continue on your current medication as directed.    *If you need a refill on your cardiac medications before your next appointment, please call your pharmacy*   Lab Work: None ordered today   Testing/Procedures: None ordered today   Follow-Up: At [facility], you and your health needs are our priority.  As part of our continuing mission to provide you with exceptional heart care, we have created designated Provider Care Teams.  These Care Teams include your primary Cardiologist (physician) and Advanced Practice Providers (APPs -  Physician Assistants and Nurse Practitioners) who all work together to provide you with the care you need, when you need it.  We recommend signing up for the patient portal called "MyChart".  Sign up information is provided on this After Visit Summary.  MyChart is used to connect with patients for Virtual Visits (Telemedicine).  Patients are able to view lab/test results, encounter notes, upcoming appointments, etc.  Non-urgent messages can be sent to your provider as well.   To learn more about what you can do with MyChart, go to [email or web address].    Your next appointment:   1 year(s) @ [address] [place], [place] [address]   The format for your next appointment:   In Person  Provider:   Buford Dresser, MD

## 2021-03-16 NOTE — Progress Notes (Signed)
Cardiology Office Note:    Date:  03/16/2021   ID:  [name], Ronald Lowery 26, 1942, MRN [id number]  PCP:  Burnard Bunting, MD  Cardiologist:  Buford Dresser, MD  Referring MD: Burnard Bunting, MD   CC: follow up  History of Present Illness:    Ronald Lowery is a 80 y.o. male with a hx of hypertension, hyperlipidemia, hypothyroidism, bladder cancer, atrial fibrillation who is seen for follow up today. I initially saw him 03/16/20 as a new consult at the request of Burnard Bunting, MD for the evaluation and management of abnormal ECG.  Today: Discussed amiodarone, apixaban. Here with his wife today. Discussed pros/cons of stopping amiodarone, decreasing dose, staying with current dose. Discuss long term monitoring. He did not do well on metoprolol.   Wife says he sleeps a lot during the day, unchanged recently.   Reviewed labs from 01/2021 per [facility], had levothyroxine adjusted due to TSH. LFTs checked at that time as well.  Denies chest pain, shortness of breath at rest or with normal exertion. No PND, orthopnea, LE edema or unexpected weight gain. No syncope or palpitations.  Past Medical History:  Diagnosis Date  . Anxiety   . Arthritis   . Basal cell carcinoma 2004   x1-face  . Cataract    bilateral-Lowery be removed 05/2015  . Complication of anesthesia    "took a while to wake up with triple hernia repair"  . DDD (degenerative disc disease), lumbar    L5-steroid injection 01/2015  . Depression   . Environmental allergies   . GERD (gastroesophageal reflux disease)   . Hematuria   . History of bladder cancer   . History of hiatal hernia   . History of prostate cancer   . History of skin cancer   . Hyperlipidemia   . Hypertension   . Hypothyroidism   . Nocturia   . Numbness    4TH / 5TH FINGER TIPS  . Prostate cancer ([name])   . Scoliosis   . Squamous carcinoma 2008   x2- neck, face  . Urgency of urination   . Varicose veins     Past Surgical History:   Procedure Laterality Date  . APPENDECTOMY  1955  . CATARACT EXTRACTION    . CYSTOSCOPY N/A 12/19/2015   Procedure: CYSTOSCOPY;  Surgeon: Raynelle Bring, MD;  Location: [facility];  Service: Urology;  Laterality: N/A;  . CYSTOSCOPY W/ RETROGRADES Bilateral 05/09/2015   Procedure: CYSTOSCOPY WITH LEFT URETERAL CANNULATION;  Surgeon: Raynelle Bring, MD;  Location: [facility];  Service: Urology;  Laterality: Bilateral;  . CYSTOSCOPY W/ RETROGRADES Bilateral 05/04/2016   Procedure: CYSTOSCOPY WITH RETROGRADE PYELOGRAM;  Surgeon: Raynelle Bring, MD;  Location: [facility];  Service: Urology;  Laterality: Bilateral;  . ESOPHAGOGASTRODUODENOSCOPY (EGD) WITH ESOPHAGEAL DILATION  2004  . EYE SURGERY     cataracts with lens implants bilateral  . FOOT SURGERY  1980's   Morton's neuroma x3  . HERNIA REPAIR     triple laproscopic  . LYMPHADENECTOMY Bilateral 06/28/2016   Procedure: PELVIC LYMPHADENECTOMY;  Surgeon: Raynelle Bring, MD;  Location: [facility];  Service: Urology;  Laterality: Bilateral;  . MASS BIOPSY     bladder tumor biopsy  . PROSTATECTOMY  2010  . ROBOT ASSISTED LAPAROSCOPIC COMPLETE CYSTECT ILEAL CONDUIT N/A 06/28/2016   Procedure: XI ROBOTIC ASSISTED LAPAROSCOPIC COMPLETE CYSTECT ILEAL CONDUIT;  Surgeon: Raynelle Bring, MD;  Location: [facility];  Service: Urology;  Laterality: N/A;  . SKIN CANCER DESTRUCTION  2004   basal  cell carcinoma  . TONSILLECTOMY  ~1949  . TOTAL HIP ARTHROPLASTY Left 08/16/2015   Procedure: LEFT TOTAL HIP ARTHROPLASTY ANTERIOR APPROACH;  Surgeon: Paralee Cancel, MD;  Location: [facility];  Service: Orthopedics;  Laterality: Left;  . TRANSURETHRAL RESECTION OF BLADDER TUMOR N/A 05/09/2015   Procedure: TRANSURETHRAL RESECTION OF BLADDER TUMOR (TURBT);  Surgeon: Raynelle Bring, MD;  Location: [facility];  Service: Urology;  Laterality: N/A;  . TRANSURETHRAL RESECTION OF BLADDER TUMOR N/A 11/14/2015   Procedure: TRANSURETHRAL RESECTION OF BLADDER TUMOR (TURBT);  Surgeon: Raynelle Bring, MD;  Location: [facility];  Service: Urology;  Laterality: N/A;  . TRANSURETHRAL RESECTION OF BLADDER TUMOR N/A 12/19/2015   Procedure: TRANSURETHRAL RESECTION OF BLADDER TUMOR (TURBT);  Surgeon: Raynelle Bring, MD;  Location: [facility];  Service: Urology;  Laterality: N/A;  . TRANSURETHRAL RESECTION OF BLADDER TUMOR N/A 05/04/2016   Procedure: TRANSURETHRAL RESECTION OF BLADDER TUMOR (TURBT);  Surgeon: Raynelle Bring, MD;  Location: [facility];  Service: Urology;  Laterality: N/A;  . VEIN SURGERY Left 2011   leg    Current Medications: Current Outpatient Medications on File Prior to Visit  Medication Sig  . amiodarone (PACERONE) 200 MG tablet Take 1 tablet (200 mg total) by mouth daily for 14 days.  Marland Kitchen apixaban (ELIQUIS) 5 MG TABS tablet Take 1 tablet (5 mg total) by mouth 2 (two) times daily.  Marland Kitchen gabapentin (NEURONTIN) 100 MG capsule Take 200 mg by mouth at bedtime.   Marland Kitchen LEVOTHYROXINE SODIUM PO Take 100 mcg by mouth daily.  . Probiotic Product (HEALTHY COLON PO) Take 1 tablet by mouth daily.  Marland Kitchen zolpidem (AMBIEN) 10 MG tablet Take 5 mg by mouth at bedtime.   No current facility-administered medications on file prior to visit.     Allergies:   Cefdinir and Myrbetriq [mirabegron]   Social History   Tobacco Use  . Smoking status: Never Smoker  . Smokeless tobacco: Never Used  Substance Use Topics  . Alcohol use: No  . Drug use: No    Family History: family history includes Colon cancer in his maternal aunt. There is no history of Esophageal cancer, Stomach cancer, or Rectal cancer. not sure about his mother's heart health, but none he is aware of, and she passed away in her 90s. Father died in his 47s of melanoma. No heart disease in brother or sister.  ROS:   Please see the history of present illness.  Additional pertinent ROS otherwise unremarkable.  EKGs/Labs/Other Studies Reviewed:    The following studies were reviewed today: Echo 04/05/20 1. Pt appears to be in atrial flutter during the study; low normal  LV  function; mildly dilated aortic root; moderate RAE; mild RVE.  2. Left ventricular ejection fraction, by estimation, is 50 to 55%. The  left ventricle has low normal function. The left ventricle has no regional  wall motion abnormalities. Left ventricular diastolic parameters are  indeterminate.  3. Right ventricular systolic function is normal. The right ventricular  size is mildly enlarged. Tricuspid regurgitation signal is inadequate for  assessing PA pressure.  4. Right atrial size was moderately dilated.  5. The mitral valve is normal in structure. Trivial mitral valve  regurgitation. No evidence of mitral stenosis.  6. The aortic valve is tricuspid. Aortic valve regurgitation is not  visualized. Mild aortic valve sclerosis is present, with no evidence of  aortic valve stenosis.  7. There is mild dilatation of the aortic root measuring 40 mm.  8. The inferior vena cava is dilated  in size with >50% respiratory  variability, suggesting right atrial pressure of 8 mmHg.   Monitor 05/23/20 7 days of data recorded on Zio monitor. Patient had a min HR of 61 bpm, max HR of 154 bpm, and avg HR of 107 bpm. Predominant underlying rhythm was atrial flutter (~100% burden). No VT, SVT, atrial fibrillation, high degree block, or pauses noted. Isolated ventricular ectopy was occasional (1.5%). There were 0 triggered events. Atrial flutter throughout study with variable conduction.  EKG:  EKG is personally reviewed.  The ekg ordered today demonstrates sinus bradycardia at 58 bpm  Recent Labs: 05/30/2020: B Natriuretic Peptide 392.5; Magnesium 1.8; TSH 0.110 06/02/2020: BUN 17; Creatinine, Ser 0.87; Hemoglobin 12.4; Platelets 224; Potassium 4.1; Sodium 129  Recent Lipid Panel No results found for: CHOL, TRIG, HDL, CHOLHDL, VLDL, LDLCALC, LDLDIRECT  Physical Exam:    VS:  BP 124/82 (BP Location: Right Arm, Patient Position: Sitting, Cuff Size: Normal)   Pulse 60   Ht 6' 3.5" (1.918 m)    Wt 160 lb (72.6 kg)   BMI 19.73 kg/m     Wt Readings from Last 3 Encounters:  03/16/21 160 lb (72.6 kg)  08/30/20 167 lb 3.2 oz (75.8 kg)  06/28/20 172 lb 9.6 oz (78.3 kg)    GEN: Well nourished, well developed in no acute distress HEENT: Normal, moist mucous membranes NECK: No JVD CARDIAC: regular rhythm, normal S1 and S2, no rubs or gallops. No murmur. VASCULAR: Radial and DP pulses 2+ bilaterally. No carotid bruits RESPIRATORY:  Clear to auscultation without rales, wheezing or rhonchi  ABDOMEN: Soft, non-tender, non-distended MUSCULOSKELETAL:  Ambulates independently SKIN: Warm and dry, no edema NEUROLOGIC:  Alert and oriented x 3. No focal neuro deficits noted. PSYCHIATRIC:  Normal affect   ASSESSMENT:    1. Paroxysmal atrial fibrillation (HCC)   2. Long term (current) use of anticoagulants   3. Fatigue, unspecified type   4. Cardiac risk counseling   5. Counseling on health promotion and disease prevention    PLAN:    Atrial fibrillation/atrial flutter, paroxysmal -in sinus today by exam -feels better on amiodarone than on metoprolol -we discussed dropping amiodarone dose, but after shared decision making will keep it as 200 mg daily -CHA2DS2/VAS Stroke Risk Points = 3  -continue apixaban, need to follow if cost makes this prohibitive -with amiodarone, last LFTs nl. Has hypothyroidism, being managed by Dr. [name]  Hypotension, fatigue: -somewhat improved since stopping metoprolol, but chronic. Not recently worsened  Cardiac risk counseling and prevention recommendations: -recommend heart healthy/Mediterranean diet, with whole grains, fruits, vegetable, fish, lean meats, nuts, and olive oil. Limit salt. -recommend moderate walking, 3-5 times/week for 30-50 minutes each session. Aim for at least 150 minutes.week. Goal should be pace of 3 miles/hours, or walking 1.5 miles in 30 minutes -recommend avoidance of tobacco products. Avoid excess alcohol.  Plan for follow  up: 1 year or sooner as needed  Buford Dresser, MD, PhD [place]  [facility]    Medication Adjustments/Labs and Tests Ordered: Current medicines are reviewed at length with the patient today.  Concerns regarding medicines are outlined above.  No orders of the defined types were placed in this encounter.  No orders of the defined types were placed in this encounter.   Patient Instructions  Medication Instructions:  Your Physician recommend you continue on your current medication as directed.    *If you need a refill on your cardiac medications before your next appointment, please call your pharmacy*  Lab Work: None ordered today   Testing/Procedures: None ordered today   Follow-Up: At [facility], you and your health needs are our priority.  As part of our continuing mission to provide you with exceptional heart care, we have created designated Provider Care Teams.  These Care Teams include your primary Cardiologist (physician) and Advanced Practice Providers (APPs -  Physician Assistants and Nurse Practitioners) who all work together to provide you with the care you need, when you need it.  We recommend signing up for the patient portal called "MyChart".  Sign up information is provided on this After Visit Summary.  MyChart is used to connect with patients for Virtual Visits (Telemedicine).  Patients are able to view lab/test results, encounter notes, upcoming appointments, etc.  Non-urgent messages can be sent to your provider as well.   To learn more about what you can do with MyChart, go to [email or web address].    Your next appointment:   1 year(s) @ [address] [place], [place] [address]   The format for your next appointment:   In Person  Provider:   Buford Dresser, MD       Signed, Buford Dresser, MD PhD 03/16/2021    [place]

## 2021-03-20 ENCOUNTER — Encounter: Payer: Self-pay | Admitting: Cardiology

## 2021-04-12 DIAGNOSIS — R413 Other amnesia: Secondary | ICD-10-CM | POA: Diagnosis not present

## 2021-04-12 DIAGNOSIS — I10 Essential (primary) hypertension: Secondary | ICD-10-CM | POA: Diagnosis not present

## 2021-04-12 DIAGNOSIS — I700 Atherosclerosis of aorta: Secondary | ICD-10-CM | POA: Diagnosis not present

## 2021-04-12 DIAGNOSIS — M48061 Spinal stenosis, lumbar region without neurogenic claudication: Secondary | ICD-10-CM | POA: Diagnosis not present

## 2021-05-09 DIAGNOSIS — C679 Malignant neoplasm of bladder, unspecified: Secondary | ICD-10-CM | POA: Diagnosis not present

## 2021-05-09 DIAGNOSIS — K439 Ventral hernia without obstruction or gangrene: Secondary | ICD-10-CM | POA: Diagnosis not present

## 2021-05-29 DIAGNOSIS — Z23 Encounter for immunization: Secondary | ICD-10-CM | POA: Diagnosis not present

## 2021-07-31 DIAGNOSIS — E039 Hypothyroidism, unspecified: Secondary | ICD-10-CM | POA: Diagnosis not present

## 2021-07-31 DIAGNOSIS — Z1211 Encounter for screening for malignant neoplasm of colon: Secondary | ICD-10-CM | POA: Diagnosis not present

## 2021-07-31 DIAGNOSIS — K439 Ventral hernia without obstruction or gangrene: Secondary | ICD-10-CM | POA: Diagnosis not present

## 2021-07-31 DIAGNOSIS — K5900 Constipation, unspecified: Secondary | ICD-10-CM | POA: Diagnosis not present

## 2021-07-31 DIAGNOSIS — C679 Malignant neoplasm of bladder, unspecified: Secondary | ICD-10-CM | POA: Diagnosis not present

## 2021-09-02 ENCOUNTER — Other Ambulatory Visit: Payer: Self-pay | Admitting: Cardiology

## 2021-09-02 DIAGNOSIS — I480 Paroxysmal atrial fibrillation: Secondary | ICD-10-CM

## 2021-09-04 NOTE — Telephone Encounter (Signed)
Prescription refill request for Eliquis received. Indication:Afib Last office visit:6/22 Scr:0.9 Age: 80 Weight:72.6 kg  Prescription refilled

## 2021-09-23 ENCOUNTER — Other Ambulatory Visit: Payer: Self-pay | Admitting: Cardiology

## 2021-09-23 DIAGNOSIS — I480 Paroxysmal atrial fibrillation: Secondary | ICD-10-CM

## 2021-10-19 DIAGNOSIS — I10 Essential (primary) hypertension: Secondary | ICD-10-CM | POA: Diagnosis not present

## 2021-10-19 DIAGNOSIS — E785 Hyperlipidemia, unspecified: Secondary | ICD-10-CM | POA: Diagnosis not present

## 2021-10-19 DIAGNOSIS — E039 Hypothyroidism, unspecified: Secondary | ICD-10-CM | POA: Diagnosis not present

## 2021-10-19 DIAGNOSIS — Z125 Encounter for screening for malignant neoplasm of prostate: Secondary | ICD-10-CM | POA: Diagnosis not present

## 2021-10-25 ENCOUNTER — Encounter (HOSPITAL_COMMUNITY): Payer: Self-pay

## 2021-10-25 ENCOUNTER — Other Ambulatory Visit (HOSPITAL_COMMUNITY): Payer: Self-pay | Admitting: Internal Medicine

## 2021-10-25 ENCOUNTER — Ambulatory Visit (HOSPITAL_COMMUNITY): Payer: Medicare Other

## 2021-10-25 DIAGNOSIS — I700 Atherosclerosis of aorta: Secondary | ICD-10-CM | POA: Diagnosis not present

## 2021-10-25 DIAGNOSIS — Z1331 Encounter for screening for depression: Secondary | ICD-10-CM | POA: Diagnosis not present

## 2021-10-25 DIAGNOSIS — Z1212 Encounter for screening for malignant neoplasm of rectum: Secondary | ICD-10-CM | POA: Diagnosis not present

## 2021-10-25 DIAGNOSIS — F22 Delusional disorders: Secondary | ICD-10-CM | POA: Diagnosis not present

## 2021-10-25 DIAGNOSIS — Z1339 Encounter for screening examination for other mental health and behavioral disorders: Secondary | ICD-10-CM | POA: Diagnosis not present

## 2021-10-25 DIAGNOSIS — K5900 Constipation, unspecified: Secondary | ICD-10-CM | POA: Diagnosis not present

## 2021-10-25 DIAGNOSIS — F329 Major depressive disorder, single episode, unspecified: Secondary | ICD-10-CM | POA: Diagnosis not present

## 2021-10-25 DIAGNOSIS — F419 Anxiety disorder, unspecified: Secondary | ICD-10-CM | POA: Diagnosis not present

## 2021-10-25 DIAGNOSIS — Z0000 Encounter for general adult medical examination without abnormal findings: Secondary | ICD-10-CM | POA: Diagnosis not present

## 2021-10-25 DIAGNOSIS — E039 Hypothyroidism, unspecified: Secondary | ICD-10-CM | POA: Diagnosis not present

## 2021-10-25 DIAGNOSIS — K219 Gastro-esophageal reflux disease without esophagitis: Secondary | ICD-10-CM | POA: Diagnosis not present

## 2021-10-25 DIAGNOSIS — R413 Other amnesia: Secondary | ICD-10-CM

## 2021-10-25 DIAGNOSIS — E785 Hyperlipidemia, unspecified: Secondary | ICD-10-CM | POA: Diagnosis not present

## 2021-11-27 DIAGNOSIS — Z8551 Personal history of malignant neoplasm of bladder: Secondary | ICD-10-CM | POA: Diagnosis not present

## 2021-11-30 DIAGNOSIS — K7689 Other specified diseases of liver: Secondary | ICD-10-CM | POA: Diagnosis not present

## 2021-11-30 DIAGNOSIS — C61 Malignant neoplasm of prostate: Secondary | ICD-10-CM | POA: Diagnosis not present

## 2021-11-30 DIAGNOSIS — Z8546 Personal history of malignant neoplasm of prostate: Secondary | ICD-10-CM | POA: Diagnosis not present

## 2021-11-30 DIAGNOSIS — N200 Calculus of kidney: Secondary | ICD-10-CM | POA: Diagnosis not present

## 2021-11-30 DIAGNOSIS — Z8551 Personal history of malignant neoplasm of bladder: Secondary | ICD-10-CM | POA: Diagnosis not present

## 2021-11-30 DIAGNOSIS — K5900 Constipation, unspecified: Secondary | ICD-10-CM | POA: Diagnosis not present

## 2021-11-30 DIAGNOSIS — J9811 Atelectasis: Secondary | ICD-10-CM | POA: Diagnosis not present

## 2021-11-30 DIAGNOSIS — I2510 Atherosclerotic heart disease of native coronary artery without angina pectoris: Secondary | ICD-10-CM | POA: Diagnosis not present

## 2021-12-07 DIAGNOSIS — Z8546 Personal history of malignant neoplasm of prostate: Secondary | ICD-10-CM | POA: Diagnosis not present

## 2021-12-07 DIAGNOSIS — Z8551 Personal history of malignant neoplasm of bladder: Secondary | ICD-10-CM | POA: Diagnosis not present

## 2022-03-06 DIAGNOSIS — H43813 Vitreous degeneration, bilateral: Secondary | ICD-10-CM | POA: Diagnosis not present

## 2022-03-06 DIAGNOSIS — H0100A Unspecified blepharitis right eye, upper and lower eyelids: Secondary | ICD-10-CM | POA: Diagnosis not present

## 2022-03-06 DIAGNOSIS — H04123 Dry eye syndrome of bilateral lacrimal glands: Secondary | ICD-10-CM | POA: Diagnosis not present

## 2022-03-06 DIAGNOSIS — H524 Presbyopia: Secondary | ICD-10-CM | POA: Diagnosis not present

## 2022-03-22 ENCOUNTER — Ambulatory Visit (HOSPITAL_BASED_OUTPATIENT_CLINIC_OR_DEPARTMENT_OTHER): Payer: Medicare Other | Admitting: Cardiology

## 2022-05-07 ENCOUNTER — Ambulatory Visit (HOSPITAL_BASED_OUTPATIENT_CLINIC_OR_DEPARTMENT_OTHER): Payer: Medicare Other | Admitting: Cardiology

## 2022-05-19 ENCOUNTER — Other Ambulatory Visit: Payer: Self-pay | Admitting: Cardiology

## 2022-05-19 DIAGNOSIS — I480 Paroxysmal atrial fibrillation: Secondary | ICD-10-CM

## 2022-05-21 NOTE — Telephone Encounter (Signed)
Eliquis '5mg'$  refill request received. Patient is 81 years old, weight-72.6kg, Crea-0.80 on 11/27/2021 via [place] from [facility], Diagnosis-Afib, and last seen by Dr. Harrell Gave on 03/16/2021 and pending appt on 06/27/2022 Dose is appropriate based on dosing criteria. Will send in refill to requested pharmacy.

## 2022-05-21 NOTE — Telephone Encounter (Signed)
Please review Eliquis for refill. Thank you!

## 2022-06-21 ENCOUNTER — Ambulatory Visit (HOSPITAL_BASED_OUTPATIENT_CLINIC_OR_DEPARTMENT_OTHER): Payer: Medicare Other | Admitting: Cardiology

## 2022-06-27 ENCOUNTER — Ambulatory Visit (INDEPENDENT_AMBULATORY_CARE_PROVIDER_SITE_OTHER): Payer: Medicare Other | Admitting: Cardiology

## 2022-06-27 ENCOUNTER — Encounter (HOSPITAL_BASED_OUTPATIENT_CLINIC_OR_DEPARTMENT_OTHER): Payer: Self-pay | Admitting: Cardiology

## 2022-06-27 VITALS — BP 104/65 | HR 52 | Ht 72.0 in | Wt 147.7 lb

## 2022-06-27 DIAGNOSIS — Z7189 Other specified counseling: Secondary | ICD-10-CM | POA: Diagnosis not present

## 2022-06-27 DIAGNOSIS — D6869 Other thrombophilia: Secondary | ICD-10-CM

## 2022-06-27 DIAGNOSIS — I480 Paroxysmal atrial fibrillation: Secondary | ICD-10-CM

## 2022-06-27 DIAGNOSIS — Z7901 Long term (current) use of anticoagulants: Secondary | ICD-10-CM | POA: Diagnosis not present

## 2022-06-27 NOTE — Patient Instructions (Signed)
Medication Instructions:  Your physician has recommended you make the following change in your medication:  Stop: Amiodarone   *If you need a refill on your cardiac medications before your next appointment, please call your pharmacy*  Follow-Up: At [facility], you and your health needs are our priority.  As part of our continuing mission to provide you with exceptional heart care, we have created designated Provider Care Teams.  These Care Teams include your primary Cardiologist (physician) and Advanced Practice Providers (APPs -  Physician Assistants and Nurse Practitioners) who all work together to provide you with the care you need, when you need it.  We recommend signing up for the patient portal called "MyChart".  Sign up information is provided on this After Visit Summary.  MyChart is used to connect with patients for Virtual Visits (Telemedicine).  Patients are able to view lab/test results, encounter notes, upcoming appointments, etc.  Non-urgent messages can be sent to your provider as well.   To learn more about what you can do with MyChart, go to [email or web address].    Your next appointment:   3 month(s)  The format for your next appointment:   In Person  Provider:   Buford Dresser, MD

## 2022-06-27 NOTE — Progress Notes (Signed)
Cardiology Office Note:    Date:  06/27/2022   ID:  Ronald Lowery, Ronald Lowery 1941/08/08, MRN [id number]  PCP:  Burnard Bunting, MD  Cardiologist:  Buford Dresser, MD  Referring MD: Burnard Bunting, MD   CC: follow up  History of Present Illness:    Ronald Lowery is a 81 y.o. male with a hx of hypertension, hyperlipidemia, hypothyroidism, bladder cancer, atrial fibrillation who is seen for follow up. I initially saw him 03/16/20 as a new consult at the request of Burnard Bunting, MD for the evaluation and management of abnormal ECG.  At his last appointment, we discussed amiodarone, apixaban. Accompanied by his wife. Discussed pros/cons of stopping amiodarone, decreasing dose, staying with current dose. Discussed long term monitoring. He did not do well on metoprolol.   Wife said he sleeps a lot during the day, unchanged recently.   Reviewed labs from 01/2021 per [facility], had levothyroxine adjusted due to TSH. LFTs checked at that time as well.  Today:  He is accompanied by his son. He says he has been doing well. He has not had any issues since he was in the hospital.   He reports he does not do a lot of physical activity except for walking. For the last year, he has been fairly regularly walking for short periods of time. He states that his level of activity is not what it should be. His son endorses increased fatigue and confusion.  He denies any palpitations, chest pain, shortness of breath, or peripheral edema. No lightheadedness, headaches, syncope, orthopnea, or PND.   Past Medical History:  Diagnosis Date   Anxiety    Arthritis    Basal cell carcinoma 2004   x1-face   Cataract    bilateral-may be removed 05/4165   Complication of anesthesia    "took a while to wake up with triple hernia repair"   DDD (degenerative disc disease), lumbar    L5-steroid injection 01/2015   Depression    Environmental allergies    GERD (gastroesophageal reflux disease)    Hematuria     History of bladder cancer    History of hiatal hernia    History of prostate cancer    History of skin cancer    Hyperlipidemia    Hypertension    Hypothyroidism    Nocturia    Numbness    4TH / 5TH FINGER TIPS   Prostate cancer ([place])    Scoliosis    Squamous carcinoma 2008   x2- neck, face   Urgency of urination    Varicose veins     Past Surgical History:  Procedure Laterality Date   APPENDECTOMY  1955   CATARACT EXTRACTION     CYSTOSCOPY N/A 12/19/2015   Procedure: CYSTOSCOPY;  Surgeon: Raynelle Bring, MD;  Location: [facility];  Service: Urology;  Laterality: N/A;   CYSTOSCOPY W/ RETROGRADES Bilateral 05/09/2015   Procedure: CYSTOSCOPY WITH LEFT URETERAL CANNULATION;  Surgeon: Raynelle Bring, MD;  Location: [facility];  Service: Urology;  Laterality: Bilateral;   CYSTOSCOPY W/ RETROGRADES Bilateral 05/04/2016   Procedure: CYSTOSCOPY WITH RETROGRADE PYELOGRAM;  Surgeon: Raynelle Bring, MD;  Location: [facility];  Service: Urology;  Laterality: Bilateral;   ESOPHAGOGASTRODUODENOSCOPY (EGD) WITH ESOPHAGEAL DILATION  2004   EYE SURGERY     cataracts with lens implants bilateral   FOOT SURGERY  1980's   Morton's neuroma x3   HERNIA REPAIR     triple laproscopic   LYMPHADENECTOMY Bilateral 06/28/2016   Procedure: PELVIC LYMPHADENECTOMY;  Surgeon:  Raynelle Bring, MD;  Location: [facility];  Service: Urology;  Laterality: Bilateral;   MASS BIOPSY     bladder tumor biopsy   PROSTATECTOMY  2010   ROBOT ASSISTED LAPAROSCOPIC COMPLETE CYSTECT ILEAL CONDUIT N/A 06/28/2016   Procedure: XI ROBOTIC ASSISTED LAPAROSCOPIC COMPLETE CYSTECT ILEAL CONDUIT;  Surgeon: Raynelle Bring, MD;  Location: [facility];  Service: Urology;  Laterality: N/A;   SKIN CANCER DESTRUCTION  2004   basal cell carcinoma   TONSILLECTOMY  ~1949   TOTAL HIP ARTHROPLASTY Left 08/16/2015   Procedure: LEFT TOTAL HIP ARTHROPLASTY ANTERIOR APPROACH;  Surgeon: Paralee Cancel, MD;  Location: [facility];  Service: Orthopedics;  Laterality: Left;    TRANSURETHRAL RESECTION OF BLADDER TUMOR N/A 05/09/2015   Procedure: TRANSURETHRAL RESECTION OF BLADDER TUMOR (TURBT);  Surgeon: Raynelle Bring, MD;  Location: [facility];  Service: Urology;  Laterality: N/A;   TRANSURETHRAL RESECTION OF BLADDER TUMOR N/A 11/14/2015   Procedure: TRANSURETHRAL RESECTION OF BLADDER TUMOR (TURBT);  Surgeon: Raynelle Bring, MD;  Location: [facility];  Service: Urology;  Laterality: N/A;   TRANSURETHRAL RESECTION OF BLADDER TUMOR N/A 12/19/2015   Procedure: TRANSURETHRAL RESECTION OF BLADDER TUMOR (TURBT);  Surgeon: Raynelle Bring, MD;  Location: [facility];  Service: Urology;  Laterality: N/A;   TRANSURETHRAL RESECTION OF BLADDER TUMOR N/A 05/04/2016   Procedure: TRANSURETHRAL RESECTION OF BLADDER TUMOR (TURBT);  Surgeon: Raynelle Bring, MD;  Location: [facility];  Service: Urology;  Laterality: N/A;   VEIN SURGERY Left 2011   leg    Current Medications: Current Outpatient Medications on File Prior to Visit  Medication Sig   amiodarone (PACERONE) 200 MG tablet TAKE ONE TABLET ONCE A DAY   ELIQUIS 5 MG TABS tablet TAKE 1 TABLET BY MOUTH TWICE DAILY.   gabapentin (NEURONTIN) 100 MG capsule Take 200 mg by mouth at bedtime.    LEVOTHYROXINE SODIUM PO Take 100 mcg by mouth daily.   Probiotic Product (HEALTHY COLON PO) Take 1 tablet by mouth daily.   zolpidem (AMBIEN) 10 MG tablet Take 5 mg by mouth at bedtime.   No current facility-administered medications on file prior to visit.     Allergies:   Cefdinir and Myrbetriq [mirabegron]   Social History   Tobacco Use   Smoking status: Never   Smokeless tobacco: Never  Substance Use Topics   Alcohol use: No   Drug use: No    Family History: family history includes Colon cancer in his maternal aunt. There is no history of Esophageal cancer, Stomach cancer, or Rectal cancer. not sure about his mother's heart health, but none he is aware of, and she passed away in her 90s. Father died in his 57s of melanoma. No heart disease in brother  or sister.  ROS:   Please see the history of present illness.   (+) Fatigue (+) Confusion  Additional pertinent ROS otherwise unremarkable.  EKGs/Labs/Other Studies Reviewed:    The following studies were reviewed today:  Echo 04/05/20: 1. Pt appears to be in atrial flutter during the study; low normal LV  function; mildly dilated aortic root; moderate RAE; mild RVE.   2. Left ventricular ejection fraction, by estimation, is 50 to 55%. The  left ventricle has low normal function. The left ventricle has no regional  wall motion abnormalities. Left ventricular diastolic parameters are  indeterminate.   3. Right ventricular systolic function is normal. The right ventricular  size is mildly enlarged. Tricuspid regurgitation signal is inadequate for  assessing PA pressure.   4. Right atrial  size was moderately dilated.   5. The mitral valve is normal in structure. Trivial mitral valve  regurgitation. No evidence of mitral stenosis.   6. The aortic valve is tricuspid. Aortic valve regurgitation is not  visualized. Mild aortic valve sclerosis is present, with no evidence of  aortic valve stenosis.   7. There is mild dilatation of the aortic root measuring 40 mm.   8. The inferior vena cava is dilated in size with >50% respiratory  variability, suggesting right atrial pressure of 8 mmHg.   Monitor 05/23/20: 7 days of data recorded on Zio monitor. Patient had a min HR of 61 bpm, max HR of 154 bpm, and avg HR of 107 bpm. Predominant underlying rhythm was atrial flutter (~100% burden). No VT, SVT, atrial fibrillation, high degree block, or pauses noted. Isolated ventricular ectopy was occasional (1.5%). There were 0 triggered events. Atrial flutter throughout study with variable conduction.  EKG:  EKG is personally reviewed.   06/27/22: Sinus bradycardia at 52 bpm 08/30/20: Sinus bradycardia at 58 bpm  Recent Labs: No results found for requested labs within last 365 days.  Recent Lipid  Panel No results found for: "CHOL", "TRIG", "HDL", "CHOLHDL", "VLDL", "LDLCALC", "LDLDIRECT"  Physical Exam:    VS:  BP 104/65 (BP Location: Left Arm, Patient Position: Sitting, Cuff Size: Normal)   Pulse (!) 52   Ht 6' (1.829 m)   Wt 147 lb 11.2 oz (67 kg)   BMI 20.03 kg/m     Wt Readings from Last 3 Encounters:  03/16/21 160 lb (72.6 kg)  08/30/20 167 lb 3.2 oz (75.8 kg)  06/28/20 172 lb 9.6 oz (78.3 kg)    GEN: Well nourished, well developed in no acute distress HEENT: Normal, moist mucous membranes NECK: No JVD CARDIAC: regular rhythm, normal S1 and S2, no rubs or gallops. No murmur. VASCULAR: Radial and DP pulses 2+ bilaterally. No carotid bruits RESPIRATORY:  Clear to auscultation without rales, wheezing or rhonchi  ABDOMEN: Soft, non-tender, non-distended MUSCULOSKELETAL:  Ambulates independently SKIN: Warm and dry, no edema NEUROLOGIC:  Alert and oriented x 3. No focal neuro deficits noted. PSYCHIATRIC:  Normal affect   ASSESSMENT:    1. Paroxysmal atrial fibrillation (H[facility])   2. Secondary hypercoagulable state ([place])   3. Long term (current) use of anticoagulants   4. Counseling on health promotion and disease prevention     PLAN:    Atrial fibrillation/atrial flutter, paroxysmal -in sinus today -we discussed stopping amiodarone today. He is amenable. He will contact me if he notes return of afib/flutter -CHA2DS2/VAS Stroke Risk Points = 3  -continue apixaban, need to follow if cost makes this prohibitive -did not do well with metoprolol in the past  Hypotension, fatigue: -somewhat improved since stopping metoprolol, but chronic. Not recently worsened  Cardiac risk counseling and prevention recommendations: -recommend heart healthy/Mediterranean diet, with whole grains, fruits, vegetable, fish, lean meats, nuts, and olive oil. Limit salt. -recommend moderate walking, 3-5 times/week for 30-50 minutes each session. Aim for at least 150 minutes.week. Goal should  be pace of 3 miles/hours, or walking 1.5 miles in 30 minutes -recommend avoidance of tobacco products. Avoid excess alcohol.  Plan for follow up: 3 months or sooner PRN.  Buford Dresser, MD, PhD [place]  [facility]    Medication Adjustments/Labs and Tests Ordered: Current medicines are reviewed at length with the patient today.  Concerns regarding medicines are outlined above.  Orders Placed This Encounter  Procedures   EKG 12-Lead  No orders of the defined types were placed in this encounter.  Patient Instructions  Medication Instructions:  Your physician has recommended you make the following change in your medication:  Stop: Amiodarone   *If you need a refill on your cardiac medications before your next appointment, please call your pharmacy*  Follow-Up: At [facility], you and your health needs are our priority.  As part of our continuing mission to provide you with exceptional heart care, we have created designated Provider Care Teams.  These Care Teams include your primary Cardiologist (physician) and Advanced Practice Providers (APPs -  Physician Assistants and Nurse Practitioners) who all work together to provide you with the care you need, when you need it.  We recommend signing up for the patient portal called "MyChart".  Sign up information is provided on this After Visit Summary.  MyChart is used to connect with patients for Virtual Visits (Telemedicine).  Patients are able to view lab/test results, encounter notes, upcoming appointments, etc.  Non-urgent messages can be sent to your provider as well.   To learn more about what you can do with MyChart, go to [email or web address].    Your next appointment:   3 month(s)  The format for your next appointment:   In Person  Provider:   Buford Dresser, MD           I,Breanna Adamick,acting as a scribe for Buford Dresser, MD.,have documented all relevant documentation on  the behalf of Buford Dresser, MD,as directed by  Buford Dresser, MD while in the presence of Buford Dresser, MD.   I, Buford Dresser, MD, have reviewed all documentation for this visit. The documentation on 07/16/22 for the exam, diagnosis, procedures, and orders are all accurate and complete.   Signed, Buford Dresser, MD PhD 06/27/2022    [place]

## 2022-07-16 ENCOUNTER — Encounter (HOSPITAL_BASED_OUTPATIENT_CLINIC_OR_DEPARTMENT_OTHER): Payer: Self-pay | Admitting: Cardiology

## 2022-07-16 DIAGNOSIS — Z7901 Long term (current) use of anticoagulants: Secondary | ICD-10-CM | POA: Insufficient documentation

## 2022-07-16 DIAGNOSIS — D6869 Other thrombophilia: Secondary | ICD-10-CM | POA: Insufficient documentation

## 2022-07-16 DIAGNOSIS — I480 Paroxysmal atrial fibrillation: Secondary | ICD-10-CM | POA: Insufficient documentation

## 2022-07-20 DIAGNOSIS — S20221A Contusion of right back wall of thorax, initial encounter: Secondary | ICD-10-CM | POA: Diagnosis not present

## 2022-07-20 DIAGNOSIS — W19XXXA Unspecified fall, initial encounter: Secondary | ICD-10-CM | POA: Diagnosis not present

## 2022-07-20 DIAGNOSIS — M48061 Spinal stenosis, lumbar region without neurogenic claudication: Secondary | ICD-10-CM | POA: Diagnosis not present

## 2022-10-04 ENCOUNTER — Other Ambulatory Visit (HOSPITAL_BASED_OUTPATIENT_CLINIC_OR_DEPARTMENT_OTHER): Payer: Self-pay

## 2022-10-04 ENCOUNTER — Encounter (HOSPITAL_BASED_OUTPATIENT_CLINIC_OR_DEPARTMENT_OTHER): Payer: Self-pay | Admitting: Cardiology

## 2022-10-04 ENCOUNTER — Ambulatory Visit (INDEPENDENT_AMBULATORY_CARE_PROVIDER_SITE_OTHER): Payer: Medicare Other | Admitting: Cardiology

## 2022-10-04 VITALS — BP 128/82 | HR 65 | Ht 72.0 in | Wt 146.4 lb

## 2022-10-04 DIAGNOSIS — Z7901 Long term (current) use of anticoagulants: Secondary | ICD-10-CM

## 2022-10-04 DIAGNOSIS — D6869 Other thrombophilia: Secondary | ICD-10-CM | POA: Diagnosis not present

## 2022-10-04 DIAGNOSIS — I480 Paroxysmal atrial fibrillation: Secondary | ICD-10-CM

## 2022-10-04 DIAGNOSIS — R5383 Other fatigue: Secondary | ICD-10-CM | POA: Diagnosis not present

## 2022-10-04 MED ORDER — APIXABAN 5 MG PO TABS
5.0000 mg | ORAL_TABLET | Freq: Two times a day (BID) | ORAL | 3 refills | Status: DC
  Filled 2022-10-04: qty 60, 30d supply, fill #0
  Filled 2022-10-15: qty 60, 30d supply, fill #1
  Filled 2022-10-17: qty 180, 90d supply, fill #1
  Filled 2022-10-29: qty 60, 30d supply, fill #1
  Filled 2022-11-20 – 2022-11-22 (×2): qty 60, 30d supply, fill #2

## 2022-10-04 NOTE — Patient Instructions (Signed)
Medication Instructions:  Your physician recommends that you continue on your current medications as directed. Please refer to the Current Medication list given to you today.   *If you need a refill on your cardiac medications before your next appointment, please call your pharmacy*  Lab Work: NONE   Testing/Procedures: NONE  Follow-Up: At [facility], you and your health needs are our priority.  As part of our continuing mission to provide you with exceptional heart care, we have created designated Provider Care Teams.  These Care Teams include your primary Cardiologist (physician) and Advanced Practice Providers (APPs -  Physician Assistants and Nurse Practitioners) who all work together to provide you with the care you need, when you need it.  We recommend signing up for the patient portal called "MyChart".  Sign up information is provided on this After Visit Summary.  MyChart is used to connect with patients for Virtual Visits (Telemedicine).  Patients are able to view lab/test results, encounter notes, upcoming appointments, etc.  Non-urgent messages can be sent to your provider as well.   To learn more about what you can do with MyChart, go to [email or web address].    Your next appointment:   6 month(s)  The format for your next appointment:   In Person  Provider:   Buford Dresser, MD   Important Information About Sugar

## 2022-10-04 NOTE — Progress Notes (Signed)
Cardiology Office Note:    Date:  10/04/2022   ID:  Khamarion, Bjelland [birth year], MRN [id number]  PCP:  Burnard Bunting, MD  Cardiologist:  Buford Dresser, MD  Referring MD: Burnard Bunting, MD   CC: follow up  History of Present Illness:    Ronald Lowery is a 81 y.o. male with a hx of hypertension, hyperlipidemia, hypothyroidism, bladder cancer, atrial fibrillation who is seen for follow up. I initially saw him 03/16/20 as a new consult at the request of Burnard Bunting, MD for the evaluation and management of abnormal ECG.  At his last appointment, we discussed amiodarone, apixaban. Accompanied by his wife. Discussed pros/cons of stopping amiodarone, decreasing dose, staying with current dose. Discussed long term monitoring. He did not do well on metoprolol.   Today, he reports that he is doing pretty well overall. He has not had any cardiac symptoms that have been bothering him. He hasn't felt any palpitations, but he also never felt it even when the atrial fibrillation caused him to go to the hospital. He has not noticed any blood in his stool or urine.   His wife mentioned that he has only been taking the eliquis once daily because he has been forgetting to take it in the morning. She wanted to know if that was effective to take it once a day or if they need to potentially change to another medication.   He denies any palpitations, chest pain, shortness of breath, or peripheral edema. No lightheadedness, headaches, syncope, orthopnea, or PND.    Past Medical History:  Diagnosis Date   Anxiety    Arthritis    Basal cell carcinoma 2004   x1-face   Cataract    bilateral-may be removed 04/487   Complication of anesthesia    "took a while to wake up with triple hernia repair"   DDD (degenerative disc disease), lumbar    L5-steroid injection 01/2015   Depression    Environmental allergies    GERD (gastroesophageal reflux disease)    Hematuria    History of bladder  cancer    History of hiatal hernia    History of prostate cancer    History of skin cancer    Hyperlipidemia    Hypertension    Hypothyroidism    Nocturia    Numbness    4TH / 5TH FINGER TIPS   Prostate cancer ([place])    Scoliosis    Squamous carcinoma 2008   x2- neck, face   Urgency of urination    Varicose veins     Past Surgical History:  Procedure Laterality Date   APPENDECTOMY  1955   CATARACT EXTRACTION     CYSTOSCOPY N/A 12/19/2015   Procedure: CYSTOSCOPY;  Surgeon: Raynelle Bring, MD;  Location: [facility];  Service: Urology;  Laterality: N/A;   CYSTOSCOPY W/ RETROGRADES Bilateral 05/09/2015   Procedure: CYSTOSCOPY WITH LEFT URETERAL CANNULATION;  Surgeon: Raynelle Bring, MD;  Location: [facility];  Service: Urology;  Laterality: Bilateral;   CYSTOSCOPY W/ RETROGRADES Bilateral 05/04/2016   Procedure: CYSTOSCOPY WITH RETROGRADE PYELOGRAM;  Surgeon: Raynelle Bring, MD;  Location: [facility];  Service: Urology;  Laterality: Bilateral;   ESOPHAGOGASTRODUODENOSCOPY (EGD) WITH ESOPHAGEAL DILATION  2004   EYE SURGERY     cataracts with lens implants bilateral   FOOT SURGERY  1980's   Morton's neuroma x3   HERNIA REPAIR     triple laproscopic   LYMPHADENECTOMY Bilateral 06/28/2016   Procedure: PELVIC LYMPHADENECTOMY;  Surgeon: Raynelle Bring, MD;  Location: [facility];  Service: Urology;  Laterality: Bilateral;   MASS BIOPSY     bladder tumor biopsy   PROSTATECTOMY  2010   ROBOT ASSISTED LAPAROSCOPIC COMPLETE CYSTECT ILEAL CONDUIT N/A 06/28/2016   Procedure: XI ROBOTIC ASSISTED LAPAROSCOPIC COMPLETE CYSTECT ILEAL CONDUIT;  Surgeon: Raynelle Bring, MD;  Location: [facility];  Service: Urology;  Laterality: N/A;   SKIN CANCER DESTRUCTION  2004   basal cell carcinoma   TONSILLECTOMY  ~1949   TOTAL HIP ARTHROPLASTY Left 08/16/2015   Procedure: LEFT TOTAL HIP ARTHROPLASTY ANTERIOR APPROACH;  Surgeon: Paralee Cancel, MD;  Location: [facility];  Service: Orthopedics;  Laterality: Left;   TRANSURETHRAL RESECTION OF  BLADDER TUMOR N/A 05/09/2015   Procedure: TRANSURETHRAL RESECTION OF BLADDER TUMOR (TURBT);  Surgeon: Raynelle Bring, MD;  Location: [facility];  Service: Urology;  Laterality: N/A;   TRANSURETHRAL RESECTION OF BLADDER TUMOR N/A 11/14/2015   Procedure: TRANSURETHRAL RESECTION OF BLADDER TUMOR (TURBT);  Surgeon: Raynelle Bring, MD;  Location: [facility];  Service: Urology;  Laterality: N/A;   TRANSURETHRAL RESECTION OF BLADDER TUMOR N/A 12/19/2015   Procedure: TRANSURETHRAL RESECTION OF BLADDER TUMOR (TURBT);  Surgeon: Raynelle Bring, MD;  Location: [facility];  Service: Urology;  Laterality: N/A;   TRANSURETHRAL RESECTION OF BLADDER TUMOR N/A 05/04/2016   Procedure: TRANSURETHRAL RESECTION OF BLADDER TUMOR (TURBT);  Surgeon: Raynelle Bring, MD;  Location: [facility];  Service: Urology;  Laterality: N/A;   VEIN SURGERY Left 2011   leg    Current Medications: Current Outpatient Medications on File Prior to Visit  Medication Sig   ELIQUIS 5 MG TABS tablet TAKE 1 TABLET BY MOUTH TWICE DAILY.   gabapentin (NEURONTIN) 100 MG capsule Take 200 mg by mouth at bedtime.    LEVOTHYROXINE SODIUM PO Take 100 mcg by mouth daily.   Probiotic Product (HEALTHY COLON PO) Take 1 tablet by mouth daily.   No current facility-administered medications on file prior to visit.     Allergies:   Cefdinir and Myrbetriq [mirabegron]   Social History   Tobacco Use   Smoking status: Never   Smokeless tobacco: Never  Substance Use Topics   Alcohol use: No   Drug use: No    Family History: family history includes Colon cancer in his maternal aunt. There is no history of Esophageal cancer, Stomach cancer, or Rectal cancer. not sure about his mother's heart health, but none he is aware of, and she passed away in her 90s. Father died in his 57s of melanoma. No heart disease in brother or sister.  ROS:   Please see the history of present illness.   Additional pertinent ROS otherwise unremarkable.  EKGs/Labs/Other Studies Reviewed:     The following studies were reviewed today:  Echo 04/05/20: 1. Pt appears to be in atrial flutter during the study; low normal LV  function; mildly dilated aortic root; moderate RAE; mild RVE.   2. Left ventricular ejection fraction, by estimation, is 50 to 55%. The  left ventricle has low normal function. The left ventricle has no regional  wall motion abnormalities. Left ventricular diastolic parameters are  indeterminate.   3. Right ventricular systolic function is normal. The right ventricular  size is mildly enlarged. Tricuspid regurgitation signal is inadequate for  assessing PA pressure.   4. Right atrial size was moderately dilated.   5. The mitral valve is normal in structure. Trivial mitral valve  regurgitation. No evidence of mitral stenosis.   6. The aortic valve is tricuspid. Aortic valve regurgitation is  not  visualized. Mild aortic valve sclerosis is present, with no evidence of  aortic valve stenosis.   7. There is mild dilatation of the aortic root measuring 40 mm.   8. The inferior vena cava is dilated in size with >50% respiratory  variability, suggesting right atrial pressure of 8 mmHg.   Monitor 05/23/20: 7 days of data recorded on Zio monitor. Patient had a min HR of 61 bpm, max HR of 154 bpm, and avg HR of 107 bpm. Predominant underlying rhythm was atrial flutter (~100% burden). No VT, SVT, atrial fibrillation, high degree block, or pauses noted. Isolated ventricular ectopy was occasional (1.5%). There were 0 triggered events. Atrial flutter throughout study with variable conduction.  EKG:  EKG is personally reviewed.   10/04/2022: NSR at 65 bpm 06/27/22: Sinus bradycardia at 52 bpm 08/30/20: Sinus bradycardia at 58 bpm  Recent Labs: No results found for requested labs within last 365 days.  Recent Lipid Panel No results found for: "CHOL", "TRIG", "HDL", "CHOLHDL", "VLDL", "LDLCALC", "LDLDIRECT"  Physical Exam:    VS:  BP 128/82 (BP Location: Left Arm,  Patient Position: Sitting, Cuff Size: Normal)   Pulse 65   Ht 6' (1.829 m)   Wt 146 lb 6.4 oz (66.4 kg)   BMI 19.86 kg/m     Wt Readings from Last 3 Encounters:  10/04/22 146 lb 6.4 oz (66.4 kg)  06/27/22 147 lb 11.2 oz (67 kg)  03/16/21 160 lb (72.6 kg)    GEN: Well nourished, well developed in no acute distress HEENT: Normal, moist mucous membranes NECK: No JVD CARDIAC: regular rhythm, normal S1 and S2, no rubs or gallops. No murmur. VASCULAR: Radial and DP pulses 2+ bilaterally. No carotid bruits RESPIRATORY:  Clear to auscultation without rales, wheezing or rhonchi  ABDOMEN: Soft, non-tender, non-distended MUSCULOSKELETAL:  Ambulates independently SKIN: Warm and dry, no edema NEUROLOGIC:  Alert and oriented x 3. No focal neuro deficits noted. PSYCHIATRIC:  Normal affect   ASSESSMENT:    1. Secondary hypercoagulable state ([place])   2. Paroxysmal atrial fibrillation ([place])   3. Long term (current) use of anticoagulants   4. Fatigue, unspecified type      PLAN:    Atrial fibrillation/atrial flutter, paroxysmal -in sinus today -no longer on amiodarone, doing well -CHA2DS2/VAS Stroke Risk Points = 3  -continue apixaban. Has only been taking once/day, discussed that it is not therapeutic this way. Discussed changing to Xarelto vs. Trying to take apixaban twice daily. They will try to take the apixaban BID but will contact me if this is an issue -did not do well with metoprolol in the past  Hypotension, fatigue: -somewhat improved since stopping metoprolol, but chronic. Not recently worsened  Cardiac risk counseling and prevention recommendations: -recommend heart healthy/Mediterranean diet, with whole grains, fruits, vegetable, fish, lean meats, nuts, and olive oil. Limit salt. -recommend moderate walking, 3-5 times/week for 30-50 minutes each session. Aim for at least 150 minutes.week. Goal should be pace of 3 miles/hours, or walking 1.5 miles in 30 minutes -recommend  avoidance of tobacco products. Avoid excess alcohol.  Plan for follow up: 6 months or sooner if needed.   Buford Dresser, MD, PhD [place]  [facility]    Medication Adjustments/Labs and Tests Ordered: Current medicines are reviewed at length with the patient today.  Concerns regarding medicines are outlined above.  Orders Placed This Encounter  Procedures   EKG 12-Lead   Meds ordered this encounter  Medications   apixaban (ELIQUIS) 5 MG  TABS tablet    Sig: Take 1 tablet (5 mg total) by mouth 2 (two) times daily.    Dispense:  180 tablet    Refill:  3   Patient Instructions  Medication Instructions:  Your physician recommends that you continue on your current medications as directed. Please refer to the Current Medication list given to you today.   *If you need a refill on your cardiac medications before your next appointment, please call your pharmacy*  Lab Work: NONE   Testing/Procedures: NONE  Follow-Up: At [facility], you and your health needs are our priority.  As part of our continuing mission to provide you with exceptional heart care, we have created designated Provider Care Teams.  These Care Teams include your primary Cardiologist (physician) and Advanced Practice Providers (APPs -  Physician Assistants and Nurse Practitioners) who all work together to provide you with the care you need, when you need it.  We recommend signing up for the patient portal called "MyChart".  Sign up information is provided on this After Visit Summary.  MyChart is used to connect with patients for Virtual Visits (Telemedicine).  Patients are able to view lab/test results, encounter notes, upcoming appointments, etc.  Non-urgent messages can be sent to your provider as well.   To learn more about what you can do with MyChart, go to [email or web address].    Your next appointment:   6 month(s)  The format for your next appointment:   In Person  Provider:    Buford Dresser, MD   Important Information About Sharkey Ford,acting as a scribe for Buford Dresser, MD.,have documented all relevant documentation on the behalf of Buford Dresser, MD,as directed by  Buford Dresser, MD while in the presence of Buford Dresser, MD.   I, Buford Dresser, MD, have reviewed all documentation for this visit. The documentation on 11/19/22 for the exam, diagnosis, procedures, and orders are all accurate and complete.   Signed, Buford Dresser, MD PhD 10/04/2022    [place]

## 2022-10-15 ENCOUNTER — Other Ambulatory Visit (HOSPITAL_BASED_OUTPATIENT_CLINIC_OR_DEPARTMENT_OTHER): Payer: Self-pay

## 2022-10-17 ENCOUNTER — Other Ambulatory Visit (HOSPITAL_BASED_OUTPATIENT_CLINIC_OR_DEPARTMENT_OTHER): Payer: Self-pay

## 2022-10-29 ENCOUNTER — Other Ambulatory Visit (HOSPITAL_BASED_OUTPATIENT_CLINIC_OR_DEPARTMENT_OTHER): Payer: Self-pay

## 2022-11-06 DIAGNOSIS — E785 Hyperlipidemia, unspecified: Secondary | ICD-10-CM | POA: Diagnosis not present

## 2022-11-06 DIAGNOSIS — E039 Hypothyroidism, unspecified: Secondary | ICD-10-CM | POA: Diagnosis not present

## 2022-11-06 DIAGNOSIS — Z125 Encounter for screening for malignant neoplasm of prostate: Secondary | ICD-10-CM | POA: Diagnosis not present

## 2022-11-06 DIAGNOSIS — K219 Gastro-esophageal reflux disease without esophagitis: Secondary | ICD-10-CM | POA: Diagnosis not present

## 2022-11-06 DIAGNOSIS — I10 Essential (primary) hypertension: Secondary | ICD-10-CM | POA: Diagnosis not present

## 2022-11-07 ENCOUNTER — Other Ambulatory Visit (HOSPITAL_BASED_OUTPATIENT_CLINIC_OR_DEPARTMENT_OTHER): Payer: Self-pay

## 2022-11-07 DIAGNOSIS — R413 Other amnesia: Secondary | ICD-10-CM | POA: Diagnosis not present

## 2022-11-07 DIAGNOSIS — Z0000 Encounter for general adult medical examination without abnormal findings: Secondary | ICD-10-CM | POA: Diagnosis not present

## 2022-11-07 DIAGNOSIS — F0280 Dementia in other diseases classified elsewhere without behavioral disturbance: Secondary | ICD-10-CM | POA: Diagnosis not present

## 2022-11-07 DIAGNOSIS — Z23 Encounter for immunization: Secondary | ICD-10-CM | POA: Diagnosis not present

## 2022-11-07 DIAGNOSIS — K9419 Other complications of enterostomy: Secondary | ICD-10-CM | POA: Diagnosis not present

## 2022-11-07 DIAGNOSIS — E039 Hypothyroidism, unspecified: Secondary | ICD-10-CM | POA: Diagnosis not present

## 2022-11-07 DIAGNOSIS — I4891 Unspecified atrial fibrillation: Secondary | ICD-10-CM | POA: Diagnosis not present

## 2022-11-07 DIAGNOSIS — Z1331 Encounter for screening for depression: Secondary | ICD-10-CM | POA: Diagnosis not present

## 2022-11-07 DIAGNOSIS — I10 Essential (primary) hypertension: Secondary | ICD-10-CM | POA: Diagnosis not present

## 2022-11-07 DIAGNOSIS — Z1339 Encounter for screening examination for other mental health and behavioral disorders: Secondary | ICD-10-CM | POA: Diagnosis not present

## 2022-11-07 DIAGNOSIS — G309 Alzheimer's disease, unspecified: Secondary | ICD-10-CM | POA: Diagnosis not present

## 2022-11-07 MED ORDER — LEVOTHYROXINE SODIUM 88 MCG PO TABS
88.0000 ug | ORAL_TABLET | Freq: Every morning | ORAL | 3 refills | Status: DC
  Filled 2022-11-07: qty 90, 90d supply, fill #0

## 2022-11-09 ENCOUNTER — Other Ambulatory Visit (HOSPITAL_BASED_OUTPATIENT_CLINIC_OR_DEPARTMENT_OTHER): Payer: Self-pay

## 2022-11-19 ENCOUNTER — Encounter (HOSPITAL_BASED_OUTPATIENT_CLINIC_OR_DEPARTMENT_OTHER): Payer: Self-pay | Admitting: Cardiology

## 2022-11-20 ENCOUNTER — Other Ambulatory Visit (HOSPITAL_BASED_OUTPATIENT_CLINIC_OR_DEPARTMENT_OTHER): Payer: Self-pay

## 2022-11-22 ENCOUNTER — Other Ambulatory Visit: Payer: Self-pay

## 2022-12-03 ENCOUNTER — Other Ambulatory Visit (HOSPITAL_BASED_OUTPATIENT_CLINIC_OR_DEPARTMENT_OTHER): Payer: Self-pay

## 2022-12-03 MED ORDER — QUETIAPINE FUMARATE 25 MG PO TABS
ORAL_TABLET | ORAL | 3 refills | Status: DC
  Filled 2022-12-03: qty 90, 30d supply, fill #0

## 2022-12-12 DIAGNOSIS — R310 Gross hematuria: Secondary | ICD-10-CM | POA: Diagnosis not present

## 2022-12-12 DIAGNOSIS — Z7901 Long term (current) use of anticoagulants: Secondary | ICD-10-CM | POA: Diagnosis not present

## 2022-12-12 DIAGNOSIS — F0390 Unspecified dementia without behavioral disturbance: Secondary | ICD-10-CM | POA: Diagnosis not present

## 2022-12-12 DIAGNOSIS — R109 Unspecified abdominal pain: Secondary | ICD-10-CM | POA: Diagnosis not present

## 2022-12-12 DIAGNOSIS — R4182 Altered mental status, unspecified: Secondary | ICD-10-CM | POA: Diagnosis not present

## 2022-12-12 DIAGNOSIS — R319 Hematuria, unspecified: Secondary | ICD-10-CM | POA: Diagnosis not present

## 2022-12-12 DIAGNOSIS — R2689 Other abnormalities of gait and mobility: Secondary | ICD-10-CM | POA: Diagnosis not present

## 2022-12-12 DIAGNOSIS — R531 Weakness: Secondary | ICD-10-CM | POA: Diagnosis not present

## 2022-12-12 DIAGNOSIS — R051 Acute cough: Secondary | ICD-10-CM | POA: Diagnosis not present

## 2022-12-12 DIAGNOSIS — K458 Other specified abdominal hernia without obstruction or gangrene: Secondary | ICD-10-CM | POA: Diagnosis not present

## 2022-12-12 DIAGNOSIS — N390 Urinary tract infection, site not specified: Secondary | ICD-10-CM | POA: Diagnosis not present

## 2022-12-12 DIAGNOSIS — N132 Hydronephrosis with renal and ureteral calculous obstruction: Secondary | ICD-10-CM | POA: Diagnosis not present

## 2022-12-12 DIAGNOSIS — R0902 Hypoxemia: Secondary | ICD-10-CM | POA: Diagnosis not present

## 2022-12-12 DIAGNOSIS — Z20822 Contact with and (suspected) exposure to covid-19: Secondary | ICD-10-CM | POA: Diagnosis not present

## 2022-12-14 DIAGNOSIS — I959 Hypotension, unspecified: Secondary | ICD-10-CM | POA: Diagnosis not present

## 2022-12-14 DIAGNOSIS — Z936 Other artificial openings of urinary tract status: Secondary | ICD-10-CM | POA: Diagnosis not present

## 2022-12-14 DIAGNOSIS — R2240 Localized swelling, mass and lump, unspecified lower limb: Secondary | ICD-10-CM | POA: Diagnosis not present

## 2022-12-14 DIAGNOSIS — R58 Hemorrhage, not elsewhere classified: Secondary | ICD-10-CM | POA: Diagnosis not present

## 2022-12-14 DIAGNOSIS — R310 Gross hematuria: Secondary | ICD-10-CM | POA: Diagnosis not present

## 2022-12-14 DIAGNOSIS — R456 Violent behavior: Secondary | ICD-10-CM | POA: Diagnosis not present

## 2022-12-14 DIAGNOSIS — Z8551 Personal history of malignant neoplasm of bladder: Secondary | ICD-10-CM | POA: Diagnosis not present

## 2022-12-14 DIAGNOSIS — R609 Edema, unspecified: Secondary | ICD-10-CM | POA: Diagnosis not present

## 2022-12-14 DIAGNOSIS — R404 Transient alteration of awareness: Secondary | ICD-10-CM | POA: Diagnosis not present

## 2022-12-14 DIAGNOSIS — R410 Disorientation, unspecified: Secondary | ICD-10-CM | POA: Diagnosis not present

## 2022-12-14 DIAGNOSIS — Z743 Need for continuous supervision: Secondary | ICD-10-CM | POA: Diagnosis not present

## 2022-12-14 DIAGNOSIS — W01198A Fall on same level from slipping, tripping and stumbling with subsequent striking against other object, initial encounter: Secondary | ICD-10-CM | POA: Diagnosis not present

## 2022-12-14 DIAGNOSIS — F03911 Unspecified dementia, unspecified severity, with agitation: Secondary | ICD-10-CM | POA: Diagnosis not present

## 2022-12-14 DIAGNOSIS — Z8546 Personal history of malignant neoplasm of prostate: Secondary | ICD-10-CM | POA: Diagnosis not present

## 2022-12-14 DIAGNOSIS — Z9049 Acquired absence of other specified parts of digestive tract: Secondary | ICD-10-CM | POA: Diagnosis not present

## 2022-12-14 DIAGNOSIS — F0390 Unspecified dementia without behavioral disturbance: Secondary | ICD-10-CM | POA: Diagnosis not present

## 2022-12-14 DIAGNOSIS — N200 Calculus of kidney: Secondary | ICD-10-CM | POA: Diagnosis not present

## 2022-12-14 DIAGNOSIS — Z043 Encounter for examination and observation following other accident: Secondary | ICD-10-CM | POA: Diagnosis not present

## 2022-12-14 DIAGNOSIS — N390 Urinary tract infection, site not specified: Secondary | ICD-10-CM | POA: Diagnosis not present

## 2022-12-14 DIAGNOSIS — Y998 Other external cause status: Secondary | ICD-10-CM | POA: Diagnosis not present

## 2022-12-14 DIAGNOSIS — W19XXXA Unspecified fall, initial encounter: Secondary | ICD-10-CM | POA: Diagnosis not present

## 2022-12-15 DIAGNOSIS — Z743 Need for continuous supervision: Secondary | ICD-10-CM | POA: Diagnosis not present

## 2022-12-15 DIAGNOSIS — I10 Essential (primary) hypertension: Secondary | ICD-10-CM | POA: Diagnosis not present

## 2022-12-18 DIAGNOSIS — G309 Alzheimer's disease, unspecified: Secondary | ICD-10-CM | POA: Diagnosis not present

## 2022-12-18 DIAGNOSIS — I4891 Unspecified atrial fibrillation: Secondary | ICD-10-CM | POA: Diagnosis not present

## 2022-12-18 DIAGNOSIS — E785 Hyperlipidemia, unspecified: Secondary | ICD-10-CM | POA: Diagnosis not present

## 2022-12-18 DIAGNOSIS — F32A Depression, unspecified: Secondary | ICD-10-CM | POA: Diagnosis not present

## 2022-12-18 DIAGNOSIS — E039 Hypothyroidism, unspecified: Secondary | ICD-10-CM | POA: Diagnosis not present

## 2022-12-18 DIAGNOSIS — N390 Urinary tract infection, site not specified: Secondary | ICD-10-CM | POA: Diagnosis not present

## 2022-12-21 DIAGNOSIS — E119 Type 2 diabetes mellitus without complications: Secondary | ICD-10-CM | POA: Diagnosis not present

## 2022-12-21 DIAGNOSIS — E559 Vitamin D deficiency, unspecified: Secondary | ICD-10-CM | POA: Diagnosis not present

## 2022-12-21 DIAGNOSIS — E782 Mixed hyperlipidemia: Secondary | ICD-10-CM | POA: Diagnosis not present

## 2022-12-21 DIAGNOSIS — E038 Other specified hypothyroidism: Secondary | ICD-10-CM | POA: Diagnosis not present

## 2022-12-21 DIAGNOSIS — D518 Other vitamin B12 deficiency anemias: Secondary | ICD-10-CM | POA: Diagnosis not present

## 2022-12-21 DIAGNOSIS — Z79899 Other long term (current) drug therapy: Secondary | ICD-10-CM | POA: Diagnosis not present

## 2023-01-01 DIAGNOSIS — R300 Dysuria: Secondary | ICD-10-CM | POA: Diagnosis not present

## 2023-01-01 DIAGNOSIS — B351 Tinea unguium: Secondary | ICD-10-CM | POA: Diagnosis not present

## 2023-01-01 DIAGNOSIS — M2012 Hallux valgus (acquired), left foot: Secondary | ICD-10-CM | POA: Diagnosis not present

## 2023-01-01 DIAGNOSIS — R4182 Altered mental status, unspecified: Secondary | ICD-10-CM | POA: Diagnosis not present

## 2023-01-03 DIAGNOSIS — Z743 Need for continuous supervision: Secondary | ICD-10-CM | POA: Diagnosis not present

## 2023-01-03 DIAGNOSIS — R404 Transient alteration of awareness: Secondary | ICD-10-CM | POA: Diagnosis not present

## 2023-01-03 DIAGNOSIS — N3000 Acute cystitis without hematuria: Secondary | ICD-10-CM | POA: Diagnosis not present

## 2023-01-03 DIAGNOSIS — R410 Disorientation, unspecified: Secondary | ICD-10-CM | POA: Diagnosis not present

## 2023-01-03 DIAGNOSIS — R456 Violent behavior: Secondary | ICD-10-CM | POA: Diagnosis not present

## 2023-01-03 DIAGNOSIS — G309 Alzheimer's disease, unspecified: Secondary | ICD-10-CM | POA: Diagnosis not present

## 2023-01-03 DIAGNOSIS — N3090 Cystitis, unspecified without hematuria: Secondary | ICD-10-CM | POA: Diagnosis not present

## 2023-01-03 DIAGNOSIS — F0280 Dementia in other diseases classified elsewhere without behavioral disturbance: Secondary | ICD-10-CM | POA: Diagnosis not present

## 2023-01-03 DIAGNOSIS — Z8551 Personal history of malignant neoplasm of bladder: Secondary | ICD-10-CM | POA: Diagnosis not present

## 2023-01-04 DIAGNOSIS — F02B18 Dementia in other diseases classified elsewhere, moderate, with other behavioral disturbance: Secondary | ICD-10-CM | POA: Diagnosis not present

## 2023-01-04 DIAGNOSIS — M62552 Muscle wasting and atrophy, not elsewhere classified, left thigh: Secondary | ICD-10-CM | POA: Diagnosis not present

## 2023-01-04 DIAGNOSIS — R488 Other symbolic dysfunctions: Secondary | ICD-10-CM | POA: Diagnosis not present

## 2023-01-04 DIAGNOSIS — M62551 Muscle wasting and atrophy, not elsewhere classified, right thigh: Secondary | ICD-10-CM | POA: Diagnosis not present

## 2023-01-04 DIAGNOSIS — F0280 Dementia in other diseases classified elsewhere without behavioral disturbance: Secondary | ICD-10-CM | POA: Diagnosis not present

## 2023-01-04 DIAGNOSIS — R262 Difficulty in walking, not elsewhere classified: Secondary | ICD-10-CM | POA: Diagnosis not present

## 2023-01-04 DIAGNOSIS — R4789 Other speech disturbances: Secondary | ICD-10-CM | POA: Diagnosis not present

## 2023-01-06 DIAGNOSIS — R4182 Altered mental status, unspecified: Secondary | ICD-10-CM | POA: Diagnosis not present

## 2023-01-07 DIAGNOSIS — F0280 Dementia in other diseases classified elsewhere without behavioral disturbance: Secondary | ICD-10-CM | POA: Diagnosis not present

## 2023-01-07 DIAGNOSIS — F02B18 Dementia in other diseases classified elsewhere, moderate, with other behavioral disturbance: Secondary | ICD-10-CM | POA: Diagnosis not present

## 2023-01-07 DIAGNOSIS — R4789 Other speech disturbances: Secondary | ICD-10-CM | POA: Diagnosis not present

## 2023-01-07 DIAGNOSIS — R488 Other symbolic dysfunctions: Secondary | ICD-10-CM | POA: Diagnosis not present

## 2023-01-07 DIAGNOSIS — R262 Difficulty in walking, not elsewhere classified: Secondary | ICD-10-CM | POA: Diagnosis not present

## 2023-01-07 DIAGNOSIS — M62551 Muscle wasting and atrophy, not elsewhere classified, right thigh: Secondary | ICD-10-CM | POA: Diagnosis not present

## 2023-01-08 DIAGNOSIS — F0280 Dementia in other diseases classified elsewhere without behavioral disturbance: Secondary | ICD-10-CM | POA: Diagnosis not present

## 2023-01-08 DIAGNOSIS — F02B18 Dementia in other diseases classified elsewhere, moderate, with other behavioral disturbance: Secondary | ICD-10-CM | POA: Diagnosis not present

## 2023-01-08 DIAGNOSIS — M62551 Muscle wasting and atrophy, not elsewhere classified, right thigh: Secondary | ICD-10-CM | POA: Diagnosis not present

## 2023-01-08 DIAGNOSIS — R262 Difficulty in walking, not elsewhere classified: Secondary | ICD-10-CM | POA: Diagnosis not present

## 2023-01-08 DIAGNOSIS — R4789 Other speech disturbances: Secondary | ICD-10-CM | POA: Diagnosis not present

## 2023-01-08 DIAGNOSIS — R488 Other symbolic dysfunctions: Secondary | ICD-10-CM | POA: Diagnosis not present

## 2023-01-09 DIAGNOSIS — R4789 Other speech disturbances: Secondary | ICD-10-CM | POA: Diagnosis not present

## 2023-01-09 DIAGNOSIS — R262 Difficulty in walking, not elsewhere classified: Secondary | ICD-10-CM | POA: Diagnosis not present

## 2023-01-09 DIAGNOSIS — E782 Mixed hyperlipidemia: Secondary | ICD-10-CM | POA: Diagnosis not present

## 2023-01-09 DIAGNOSIS — E119 Type 2 diabetes mellitus without complications: Secondary | ICD-10-CM | POA: Diagnosis not present

## 2023-01-09 DIAGNOSIS — F0280 Dementia in other diseases classified elsewhere without behavioral disturbance: Secondary | ICD-10-CM | POA: Diagnosis not present

## 2023-01-09 DIAGNOSIS — D518 Other vitamin B12 deficiency anemias: Secondary | ICD-10-CM | POA: Diagnosis not present

## 2023-01-09 DIAGNOSIS — M62551 Muscle wasting and atrophy, not elsewhere classified, right thigh: Secondary | ICD-10-CM | POA: Diagnosis not present

## 2023-01-09 DIAGNOSIS — F02B18 Dementia in other diseases classified elsewhere, moderate, with other behavioral disturbance: Secondary | ICD-10-CM | POA: Diagnosis not present

## 2023-01-09 DIAGNOSIS — E038 Other specified hypothyroidism: Secondary | ICD-10-CM | POA: Diagnosis not present

## 2023-01-09 DIAGNOSIS — E559 Vitamin D deficiency, unspecified: Secondary | ICD-10-CM | POA: Diagnosis not present

## 2023-01-09 DIAGNOSIS — R488 Other symbolic dysfunctions: Secondary | ICD-10-CM | POA: Diagnosis not present

## 2023-01-10 DIAGNOSIS — R262 Difficulty in walking, not elsewhere classified: Secondary | ICD-10-CM | POA: Diagnosis not present

## 2023-01-10 DIAGNOSIS — F02B18 Dementia in other diseases classified elsewhere, moderate, with other behavioral disturbance: Secondary | ICD-10-CM | POA: Diagnosis not present

## 2023-01-10 DIAGNOSIS — N390 Urinary tract infection, site not specified: Secondary | ICD-10-CM | POA: Diagnosis not present

## 2023-01-10 DIAGNOSIS — F0280 Dementia in other diseases classified elsewhere without behavioral disturbance: Secondary | ICD-10-CM | POA: Diagnosis not present

## 2023-01-10 DIAGNOSIS — M62551 Muscle wasting and atrophy, not elsewhere classified, right thigh: Secondary | ICD-10-CM | POA: Diagnosis not present

## 2023-01-10 DIAGNOSIS — R4789 Other speech disturbances: Secondary | ICD-10-CM | POA: Diagnosis not present

## 2023-01-10 DIAGNOSIS — R488 Other symbolic dysfunctions: Secondary | ICD-10-CM | POA: Diagnosis not present

## 2023-01-11 DIAGNOSIS — F0280 Dementia in other diseases classified elsewhere without behavioral disturbance: Secondary | ICD-10-CM | POA: Diagnosis not present

## 2023-01-11 DIAGNOSIS — M62551 Muscle wasting and atrophy, not elsewhere classified, right thigh: Secondary | ICD-10-CM | POA: Diagnosis not present

## 2023-01-11 DIAGNOSIS — R4789 Other speech disturbances: Secondary | ICD-10-CM | POA: Diagnosis not present

## 2023-01-11 DIAGNOSIS — F02B18 Dementia in other diseases classified elsewhere, moderate, with other behavioral disturbance: Secondary | ICD-10-CM | POA: Diagnosis not present

## 2023-01-11 DIAGNOSIS — R262 Difficulty in walking, not elsewhere classified: Secondary | ICD-10-CM | POA: Diagnosis not present

## 2023-01-11 DIAGNOSIS — R488 Other symbolic dysfunctions: Secondary | ICD-10-CM | POA: Diagnosis not present

## 2023-01-12 DIAGNOSIS — Z681 Body mass index (BMI) 19 or less, adult: Secondary | ICD-10-CM | POA: Diagnosis not present

## 2023-01-12 DIAGNOSIS — A4902 Methicillin resistant Staphylococcus aureus infection, unspecified site: Secondary | ICD-10-CM | POA: Diagnosis not present

## 2023-01-12 DIAGNOSIS — R931 Abnormal findings on diagnostic imaging of heart and coronary circulation: Secondary | ICD-10-CM | POA: Diagnosis not present

## 2023-01-12 DIAGNOSIS — G309 Alzheimer's disease, unspecified: Secondary | ICD-10-CM | POA: Diagnosis present

## 2023-01-12 DIAGNOSIS — I4891 Unspecified atrial fibrillation: Secondary | ICD-10-CM | POA: Diagnosis not present

## 2023-01-12 DIAGNOSIS — W19XXXA Unspecified fall, initial encounter: Secondary | ICD-10-CM | POA: Diagnosis not present

## 2023-01-12 DIAGNOSIS — E11649 Type 2 diabetes mellitus with hypoglycemia without coma: Secondary | ICD-10-CM | POA: Diagnosis not present

## 2023-01-12 DIAGNOSIS — Z8546 Personal history of malignant neoplasm of prostate: Secondary | ICD-10-CM | POA: Diagnosis not present

## 2023-01-12 DIAGNOSIS — I371 Nonrheumatic pulmonary valve insufficiency: Secondary | ICD-10-CM | POA: Diagnosis not present

## 2023-01-12 DIAGNOSIS — J189 Pneumonia, unspecified organism: Secondary | ICD-10-CM | POA: Diagnosis not present

## 2023-01-12 DIAGNOSIS — M79661 Pain in right lower leg: Secondary | ICD-10-CM | POA: Diagnosis not present

## 2023-01-12 DIAGNOSIS — Z79899 Other long term (current) drug therapy: Secondary | ICD-10-CM | POA: Diagnosis not present

## 2023-01-12 DIAGNOSIS — Z515 Encounter for palliative care: Secondary | ICD-10-CM | POA: Diagnosis not present

## 2023-01-12 DIAGNOSIS — G20A1 Parkinson's disease without dyskinesia, without mention of fluctuations: Secondary | ICD-10-CM | POA: Diagnosis present

## 2023-01-12 DIAGNOSIS — G9340 Encephalopathy, unspecified: Secondary | ICD-10-CM | POA: Diagnosis not present

## 2023-01-12 DIAGNOSIS — R001 Bradycardia, unspecified: Secondary | ICD-10-CM | POA: Diagnosis not present

## 2023-01-12 DIAGNOSIS — C61 Malignant neoplasm of prostate: Secondary | ICD-10-CM | POA: Diagnosis not present

## 2023-01-12 DIAGNOSIS — B9562 Methicillin resistant Staphylococcus aureus infection as the cause of diseases classified elsewhere: Secondary | ICD-10-CM | POA: Diagnosis not present

## 2023-01-12 DIAGNOSIS — R410 Disorientation, unspecified: Secondary | ICD-10-CM | POA: Diagnosis not present

## 2023-01-12 DIAGNOSIS — I340 Nonrheumatic mitral (valve) insufficiency: Secondary | ICD-10-CM | POA: Diagnosis not present

## 2023-01-12 DIAGNOSIS — A419 Sepsis, unspecified organism: Secondary | ICD-10-CM | POA: Diagnosis not present

## 2023-01-12 DIAGNOSIS — R4182 Altered mental status, unspecified: Secondary | ICD-10-CM | POA: Diagnosis not present

## 2023-01-12 DIAGNOSIS — C679 Malignant neoplasm of bladder, unspecified: Secondary | ICD-10-CM | POA: Diagnosis not present

## 2023-01-12 DIAGNOSIS — R400 Somnolence: Secondary | ICD-10-CM | POA: Diagnosis not present

## 2023-01-12 DIAGNOSIS — J159 Unspecified bacterial pneumonia: Secondary | ICD-10-CM | POA: Diagnosis not present

## 2023-01-12 DIAGNOSIS — G9341 Metabolic encephalopathy: Secondary | ICD-10-CM | POA: Diagnosis present

## 2023-01-12 DIAGNOSIS — K5909 Other constipation: Secondary | ICD-10-CM | POA: Diagnosis not present

## 2023-01-12 DIAGNOSIS — R509 Fever, unspecified: Secondary | ICD-10-CM | POA: Diagnosis not present

## 2023-01-12 DIAGNOSIS — N200 Calculus of kidney: Secondary | ICD-10-CM | POA: Diagnosis not present

## 2023-01-12 DIAGNOSIS — Z8554 Personal history of malignant neoplasm of ureter: Secondary | ICD-10-CM | POA: Diagnosis not present

## 2023-01-12 DIAGNOSIS — M25571 Pain in right ankle and joints of right foot: Secondary | ICD-10-CM | POA: Diagnosis not present

## 2023-01-12 DIAGNOSIS — Z936 Other artificial openings of urinary tract status: Secondary | ICD-10-CM | POA: Diagnosis not present

## 2023-01-12 DIAGNOSIS — M7989 Other specified soft tissue disorders: Secondary | ICD-10-CM | POA: Diagnosis not present

## 2023-01-12 DIAGNOSIS — Z781 Physical restraint status: Secondary | ICD-10-CM | POA: Diagnosis not present

## 2023-01-12 DIAGNOSIS — Z66 Do not resuscitate: Secondary | ICD-10-CM | POA: Diagnosis not present

## 2023-01-12 DIAGNOSIS — Z85828 Personal history of other malignant neoplasm of skin: Secondary | ICD-10-CM | POA: Diagnosis not present

## 2023-01-12 DIAGNOSIS — I361 Nonrheumatic tricuspid (valve) insufficiency: Secondary | ICD-10-CM | POA: Diagnosis not present

## 2023-01-12 DIAGNOSIS — C676 Malignant neoplasm of ureteric orifice: Secondary | ICD-10-CM | POA: Diagnosis not present

## 2023-01-12 DIAGNOSIS — F05 Delirium due to known physiological condition: Secondary | ICD-10-CM | POA: Diagnosis not present

## 2023-01-12 DIAGNOSIS — Z7901 Long term (current) use of anticoagulants: Secondary | ICD-10-CM | POA: Diagnosis not present

## 2023-01-12 DIAGNOSIS — F02818 Dementia in other diseases classified elsewhere, unspecified severity, with other behavioral disturbance: Secondary | ICD-10-CM | POA: Diagnosis present

## 2023-01-12 DIAGNOSIS — F02811 Dementia in other diseases classified elsewhere, unspecified severity, with agitation: Secondary | ICD-10-CM | POA: Diagnosis not present

## 2023-01-12 DIAGNOSIS — I071 Rheumatic tricuspid insufficiency: Secondary | ICD-10-CM | POA: Diagnosis not present

## 2023-01-12 DIAGNOSIS — R262 Difficulty in walking, not elsewhere classified: Secondary | ICD-10-CM | POA: Diagnosis not present

## 2023-01-12 DIAGNOSIS — R7881 Bacteremia: Secondary | ICD-10-CM | POA: Diagnosis not present

## 2023-01-12 DIAGNOSIS — Z888 Allergy status to other drugs, medicaments and biological substances status: Secondary | ICD-10-CM | POA: Diagnosis not present

## 2023-01-12 DIAGNOSIS — K7689 Other specified diseases of liver: Secondary | ICD-10-CM | POA: Diagnosis not present

## 2023-01-12 DIAGNOSIS — F0280 Dementia in other diseases classified elsewhere without behavioral disturbance: Secondary | ICD-10-CM | POA: Diagnosis not present

## 2023-01-12 DIAGNOSIS — R404 Transient alteration of awareness: Secondary | ICD-10-CM | POA: Diagnosis not present

## 2023-01-12 DIAGNOSIS — J690 Pneumonitis due to inhalation of food and vomit: Secondary | ICD-10-CM | POA: Diagnosis not present

## 2023-01-12 DIAGNOSIS — A4102 Sepsis due to Methicillin resistant Staphylococcus aureus: Secondary | ICD-10-CM | POA: Diagnosis not present

## 2023-01-12 DIAGNOSIS — N3000 Acute cystitis without hematuria: Secondary | ICD-10-CM | POA: Diagnosis present

## 2023-01-12 DIAGNOSIS — E43 Unspecified severe protein-calorie malnutrition: Secondary | ICD-10-CM | POA: Diagnosis present

## 2023-01-12 DIAGNOSIS — F0390 Unspecified dementia without behavioral disturbance: Secondary | ICD-10-CM | POA: Diagnosis not present

## 2023-01-12 DIAGNOSIS — I10 Essential (primary) hypertension: Secondary | ICD-10-CM | POA: Diagnosis not present

## 2023-01-12 DIAGNOSIS — E039 Hypothyroidism, unspecified: Secondary | ICD-10-CM | POA: Diagnosis present

## 2023-01-12 DIAGNOSIS — E162 Hypoglycemia, unspecified: Secondary | ICD-10-CM | POA: Diagnosis not present

## 2023-01-12 DIAGNOSIS — Z8551 Personal history of malignant neoplasm of bladder: Secondary | ICD-10-CM | POA: Diagnosis not present

## 2023-01-12 DIAGNOSIS — Z881 Allergy status to other antibiotic agents status: Secondary | ICD-10-CM | POA: Diagnosis not present

## 2023-01-12 DIAGNOSIS — N390 Urinary tract infection, site not specified: Secondary | ICD-10-CM | POA: Diagnosis not present

## 2023-01-12 DIAGNOSIS — Z9049 Acquired absence of other specified parts of digestive tract: Secondary | ICD-10-CM | POA: Diagnosis not present

## 2023-01-13 DIAGNOSIS — Z881 Allergy status to other antibiotic agents status: Secondary | ICD-10-CM | POA: Diagnosis not present

## 2023-01-13 DIAGNOSIS — J690 Pneumonitis due to inhalation of food and vomit: Secondary | ICD-10-CM | POA: Diagnosis not present

## 2023-01-13 DIAGNOSIS — N200 Calculus of kidney: Secondary | ICD-10-CM | POA: Diagnosis not present

## 2023-01-13 DIAGNOSIS — R509 Fever, unspecified: Secondary | ICD-10-CM | POA: Diagnosis not present

## 2023-01-13 DIAGNOSIS — F02818 Dementia in other diseases classified elsewhere, unspecified severity, with other behavioral disturbance: Secondary | ICD-10-CM | POA: Diagnosis present

## 2023-01-13 DIAGNOSIS — J189 Pneumonia, unspecified organism: Secondary | ICD-10-CM | POA: Diagnosis not present

## 2023-01-13 DIAGNOSIS — G9341 Metabolic encephalopathy: Secondary | ICD-10-CM | POA: Diagnosis present

## 2023-01-13 DIAGNOSIS — Z9049 Acquired absence of other specified parts of digestive tract: Secondary | ICD-10-CM | POA: Diagnosis not present

## 2023-01-13 DIAGNOSIS — C676 Malignant neoplasm of ureteric orifice: Secondary | ICD-10-CM | POA: Diagnosis not present

## 2023-01-13 DIAGNOSIS — I340 Nonrheumatic mitral (valve) insufficiency: Secondary | ICD-10-CM | POA: Diagnosis not present

## 2023-01-13 DIAGNOSIS — Z8551 Personal history of malignant neoplasm of bladder: Secondary | ICD-10-CM | POA: Diagnosis not present

## 2023-01-13 DIAGNOSIS — A4902 Methicillin resistant Staphylococcus aureus infection, unspecified site: Secondary | ICD-10-CM | POA: Diagnosis not present

## 2023-01-13 DIAGNOSIS — R7881 Bacteremia: Secondary | ICD-10-CM | POA: Diagnosis not present

## 2023-01-13 DIAGNOSIS — I10 Essential (primary) hypertension: Secondary | ICD-10-CM | POA: Diagnosis present

## 2023-01-13 DIAGNOSIS — Z888 Allergy status to other drugs, medicaments and biological substances status: Secondary | ICD-10-CM | POA: Diagnosis not present

## 2023-01-13 DIAGNOSIS — N3000 Acute cystitis without hematuria: Secondary | ICD-10-CM | POA: Diagnosis present

## 2023-01-13 DIAGNOSIS — R931 Abnormal findings on diagnostic imaging of heart and coronary circulation: Secondary | ICD-10-CM | POA: Diagnosis not present

## 2023-01-13 DIAGNOSIS — A419 Sepsis, unspecified organism: Secondary | ICD-10-CM | POA: Diagnosis not present

## 2023-01-13 DIAGNOSIS — C61 Malignant neoplasm of prostate: Secondary | ICD-10-CM | POA: Diagnosis not present

## 2023-01-13 DIAGNOSIS — Z936 Other artificial openings of urinary tract status: Secondary | ICD-10-CM | POA: Diagnosis not present

## 2023-01-13 DIAGNOSIS — E162 Hypoglycemia, unspecified: Secondary | ICD-10-CM | POA: Diagnosis present

## 2023-01-13 DIAGNOSIS — Z8546 Personal history of malignant neoplasm of prostate: Secondary | ICD-10-CM | POA: Diagnosis not present

## 2023-01-13 DIAGNOSIS — B9562 Methicillin resistant Staphylococcus aureus infection as the cause of diseases classified elsewhere: Secondary | ICD-10-CM | POA: Diagnosis not present

## 2023-01-13 DIAGNOSIS — I361 Nonrheumatic tricuspid (valve) insufficiency: Secondary | ICD-10-CM | POA: Diagnosis not present

## 2023-01-13 DIAGNOSIS — I071 Rheumatic tricuspid insufficiency: Secondary | ICD-10-CM | POA: Diagnosis not present

## 2023-01-13 DIAGNOSIS — F0280 Dementia in other diseases classified elsewhere without behavioral disturbance: Secondary | ICD-10-CM | POA: Diagnosis not present

## 2023-01-13 DIAGNOSIS — R001 Bradycardia, unspecified: Secondary | ICD-10-CM | POA: Diagnosis not present

## 2023-01-13 DIAGNOSIS — J159 Unspecified bacterial pneumonia: Secondary | ICD-10-CM | POA: Diagnosis not present

## 2023-01-13 DIAGNOSIS — E43 Unspecified severe protein-calorie malnutrition: Secondary | ICD-10-CM | POA: Diagnosis present

## 2023-01-13 DIAGNOSIS — R410 Disorientation, unspecified: Secondary | ICD-10-CM | POA: Diagnosis not present

## 2023-01-13 DIAGNOSIS — C679 Malignant neoplasm of bladder, unspecified: Secondary | ICD-10-CM | POA: Diagnosis not present

## 2023-01-13 DIAGNOSIS — Z515 Encounter for palliative care: Secondary | ICD-10-CM | POA: Diagnosis not present

## 2023-01-13 DIAGNOSIS — Z7901 Long term (current) use of anticoagulants: Secondary | ICD-10-CM | POA: Diagnosis not present

## 2023-01-13 DIAGNOSIS — G309 Alzheimer's disease, unspecified: Secondary | ICD-10-CM | POA: Diagnosis present

## 2023-01-13 DIAGNOSIS — G20A1 Parkinson's disease without dyskinesia, without mention of fluctuations: Secondary | ICD-10-CM | POA: Diagnosis present

## 2023-01-13 DIAGNOSIS — A4102 Sepsis due to Methicillin resistant Staphylococcus aureus: Secondary | ICD-10-CM | POA: Diagnosis not present

## 2023-01-13 DIAGNOSIS — F05 Delirium due to known physiological condition: Secondary | ICD-10-CM | POA: Diagnosis not present

## 2023-01-13 DIAGNOSIS — F02811 Dementia in other diseases classified elsewhere, unspecified severity, with agitation: Secondary | ICD-10-CM | POA: Diagnosis not present

## 2023-01-13 DIAGNOSIS — N390 Urinary tract infection, site not specified: Secondary | ICD-10-CM | POA: Diagnosis not present

## 2023-01-13 DIAGNOSIS — R404 Transient alteration of awareness: Secondary | ICD-10-CM | POA: Diagnosis not present

## 2023-01-13 DIAGNOSIS — R400 Somnolence: Secondary | ICD-10-CM | POA: Diagnosis not present

## 2023-01-13 DIAGNOSIS — G9340 Encephalopathy, unspecified: Secondary | ICD-10-CM | POA: Diagnosis not present

## 2023-01-13 DIAGNOSIS — Z85828 Personal history of other malignant neoplasm of skin: Secondary | ICD-10-CM | POA: Diagnosis not present

## 2023-01-13 DIAGNOSIS — K5909 Other constipation: Secondary | ICD-10-CM | POA: Diagnosis not present

## 2023-01-13 DIAGNOSIS — E039 Hypothyroidism, unspecified: Secondary | ICD-10-CM | POA: Diagnosis present

## 2023-01-13 DIAGNOSIS — Z79899 Other long term (current) drug therapy: Secondary | ICD-10-CM | POA: Diagnosis not present

## 2023-01-13 DIAGNOSIS — Z66 Do not resuscitate: Secondary | ICD-10-CM | POA: Diagnosis not present

## 2023-01-13 DIAGNOSIS — R4182 Altered mental status, unspecified: Secondary | ICD-10-CM | POA: Diagnosis not present

## 2023-01-13 DIAGNOSIS — I371 Nonrheumatic pulmonary valve insufficiency: Secondary | ICD-10-CM | POA: Diagnosis not present

## 2023-01-13 DIAGNOSIS — F0390 Unspecified dementia without behavioral disturbance: Secondary | ICD-10-CM | POA: Diagnosis not present

## 2023-01-13 DIAGNOSIS — K7689 Other specified diseases of liver: Secondary | ICD-10-CM | POA: Diagnosis not present

## 2023-01-13 DIAGNOSIS — Z781 Physical restraint status: Secondary | ICD-10-CM | POA: Diagnosis not present

## 2023-01-13 DIAGNOSIS — Z681 Body mass index (BMI) 19 or less, adult: Secondary | ICD-10-CM | POA: Diagnosis not present

## 2023-01-13 DIAGNOSIS — Z8554 Personal history of malignant neoplasm of ureter: Secondary | ICD-10-CM | POA: Diagnosis not present

## 2023-01-13 DIAGNOSIS — I4891 Unspecified atrial fibrillation: Secondary | ICD-10-CM | POA: Diagnosis not present

## 2023-01-14 DIAGNOSIS — F05 Delirium due to known physiological condition: Secondary | ICD-10-CM | POA: Diagnosis not present

## 2023-01-14 DIAGNOSIS — E162 Hypoglycemia, unspecified: Secondary | ICD-10-CM | POA: Diagnosis not present

## 2023-01-14 DIAGNOSIS — G309 Alzheimer's disease, unspecified: Secondary | ICD-10-CM | POA: Diagnosis not present

## 2023-01-14 DIAGNOSIS — F0390 Unspecified dementia without behavioral disturbance: Secondary | ICD-10-CM | POA: Diagnosis not present

## 2023-01-14 DIAGNOSIS — G9340 Encephalopathy, unspecified: Secondary | ICD-10-CM | POA: Diagnosis not present

## 2023-01-14 DIAGNOSIS — I4891 Unspecified atrial fibrillation: Secondary | ICD-10-CM | POA: Diagnosis not present

## 2023-01-14 DIAGNOSIS — E039 Hypothyroidism, unspecified: Secondary | ICD-10-CM | POA: Diagnosis not present

## 2023-01-14 DIAGNOSIS — F02811 Dementia in other diseases classified elsewhere, unspecified severity, with agitation: Secondary | ICD-10-CM | POA: Diagnosis not present

## 2023-01-14 DIAGNOSIS — A419 Sepsis, unspecified organism: Secondary | ICD-10-CM | POA: Diagnosis not present

## 2023-01-14 DIAGNOSIS — I071 Rheumatic tricuspid insufficiency: Secondary | ICD-10-CM | POA: Diagnosis not present

## 2023-01-15 DIAGNOSIS — R410 Disorientation, unspecified: Secondary | ICD-10-CM | POA: Diagnosis not present

## 2023-01-15 DIAGNOSIS — I4891 Unspecified atrial fibrillation: Secondary | ICD-10-CM | POA: Diagnosis not present

## 2023-01-15 DIAGNOSIS — E039 Hypothyroidism, unspecified: Secondary | ICD-10-CM | POA: Diagnosis not present

## 2023-01-15 DIAGNOSIS — B9562 Methicillin resistant Staphylococcus aureus infection as the cause of diseases classified elsewhere: Secondary | ICD-10-CM | POA: Diagnosis not present

## 2023-01-15 DIAGNOSIS — R7881 Bacteremia: Secondary | ICD-10-CM | POA: Diagnosis not present

## 2023-01-15 DIAGNOSIS — R4182 Altered mental status, unspecified: Secondary | ICD-10-CM | POA: Diagnosis not present

## 2023-01-15 DIAGNOSIS — A419 Sepsis, unspecified organism: Secondary | ICD-10-CM | POA: Diagnosis not present

## 2023-01-15 DIAGNOSIS — F0390 Unspecified dementia without behavioral disturbance: Secondary | ICD-10-CM | POA: Diagnosis not present

## 2023-01-15 DIAGNOSIS — N390 Urinary tract infection, site not specified: Secondary | ICD-10-CM | POA: Diagnosis not present

## 2023-01-15 DIAGNOSIS — E162 Hypoglycemia, unspecified: Secondary | ICD-10-CM | POA: Diagnosis not present

## 2023-01-16 DIAGNOSIS — I4891 Unspecified atrial fibrillation: Secondary | ICD-10-CM | POA: Diagnosis not present

## 2023-01-16 DIAGNOSIS — A419 Sepsis, unspecified organism: Secondary | ICD-10-CM | POA: Diagnosis not present

## 2023-01-16 DIAGNOSIS — E039 Hypothyroidism, unspecified: Secondary | ICD-10-CM | POA: Diagnosis not present

## 2023-01-16 DIAGNOSIS — F0390 Unspecified dementia without behavioral disturbance: Secondary | ICD-10-CM | POA: Diagnosis not present

## 2023-01-16 DIAGNOSIS — G9340 Encephalopathy, unspecified: Secondary | ICD-10-CM | POA: Diagnosis not present

## 2023-01-16 DIAGNOSIS — E162 Hypoglycemia, unspecified: Secondary | ICD-10-CM | POA: Diagnosis not present

## 2023-01-16 DIAGNOSIS — N390 Urinary tract infection, site not specified: Secondary | ICD-10-CM | POA: Diagnosis not present

## 2023-01-17 DIAGNOSIS — G9340 Encephalopathy, unspecified: Secondary | ICD-10-CM | POA: Diagnosis not present

## 2023-01-17 DIAGNOSIS — I371 Nonrheumatic pulmonary valve insufficiency: Secondary | ICD-10-CM | POA: Diagnosis not present

## 2023-01-17 DIAGNOSIS — E162 Hypoglycemia, unspecified: Secondary | ICD-10-CM | POA: Diagnosis not present

## 2023-01-17 DIAGNOSIS — A419 Sepsis, unspecified organism: Secondary | ICD-10-CM | POA: Diagnosis not present

## 2023-01-17 DIAGNOSIS — R4182 Altered mental status, unspecified: Secondary | ICD-10-CM | POA: Diagnosis not present

## 2023-01-17 DIAGNOSIS — I071 Rheumatic tricuspid insufficiency: Secondary | ICD-10-CM | POA: Diagnosis not present

## 2023-01-17 DIAGNOSIS — I361 Nonrheumatic tricuspid (valve) insufficiency: Secondary | ICD-10-CM | POA: Diagnosis not present

## 2023-01-17 DIAGNOSIS — I340 Nonrheumatic mitral (valve) insufficiency: Secondary | ICD-10-CM | POA: Diagnosis not present

## 2023-01-17 DIAGNOSIS — N390 Urinary tract infection, site not specified: Secondary | ICD-10-CM | POA: Diagnosis not present

## 2023-01-17 DIAGNOSIS — R931 Abnormal findings on diagnostic imaging of heart and coronary circulation: Secondary | ICD-10-CM | POA: Diagnosis not present

## 2023-01-18 DIAGNOSIS — R410 Disorientation, unspecified: Secondary | ICD-10-CM | POA: Diagnosis not present

## 2023-01-18 DIAGNOSIS — B9562 Methicillin resistant Staphylococcus aureus infection as the cause of diseases classified elsewhere: Secondary | ICD-10-CM | POA: Diagnosis not present

## 2023-01-18 DIAGNOSIS — E162 Hypoglycemia, unspecified: Secondary | ICD-10-CM | POA: Diagnosis not present

## 2023-01-18 DIAGNOSIS — G309 Alzheimer's disease, unspecified: Secondary | ICD-10-CM | POA: Diagnosis not present

## 2023-01-18 DIAGNOSIS — F0280 Dementia in other diseases classified elsewhere without behavioral disturbance: Secondary | ICD-10-CM | POA: Diagnosis not present

## 2023-01-18 DIAGNOSIS — N390 Urinary tract infection, site not specified: Secondary | ICD-10-CM | POA: Diagnosis not present

## 2023-01-18 DIAGNOSIS — A4902 Methicillin resistant Staphylococcus aureus infection, unspecified site: Secondary | ICD-10-CM | POA: Diagnosis not present

## 2023-01-18 DIAGNOSIS — F05 Delirium due to known physiological condition: Secondary | ICD-10-CM | POA: Diagnosis not present

## 2023-01-18 DIAGNOSIS — R7881 Bacteremia: Secondary | ICD-10-CM | POA: Diagnosis not present

## 2023-01-19 DIAGNOSIS — R509 Fever, unspecified: Secondary | ICD-10-CM | POA: Diagnosis not present

## 2023-01-19 DIAGNOSIS — B9562 Methicillin resistant Staphylococcus aureus infection as the cause of diseases classified elsewhere: Secondary | ICD-10-CM | POA: Diagnosis not present

## 2023-01-19 DIAGNOSIS — F0280 Dementia in other diseases classified elsewhere without behavioral disturbance: Secondary | ICD-10-CM | POA: Diagnosis not present

## 2023-01-19 DIAGNOSIS — J189 Pneumonia, unspecified organism: Secondary | ICD-10-CM | POA: Diagnosis not present

## 2023-01-19 DIAGNOSIS — F05 Delirium due to known physiological condition: Secondary | ICD-10-CM | POA: Diagnosis not present

## 2023-01-19 DIAGNOSIS — G309 Alzheimer's disease, unspecified: Secondary | ICD-10-CM | POA: Diagnosis not present

## 2023-01-19 DIAGNOSIS — E162 Hypoglycemia, unspecified: Secondary | ICD-10-CM | POA: Diagnosis not present

## 2023-01-19 DIAGNOSIS — R7881 Bacteremia: Secondary | ICD-10-CM | POA: Diagnosis not present

## 2023-01-20 DIAGNOSIS — R509 Fever, unspecified: Secondary | ICD-10-CM | POA: Diagnosis not present

## 2023-01-20 DIAGNOSIS — J189 Pneumonia, unspecified organism: Secondary | ICD-10-CM | POA: Diagnosis not present

## 2023-01-20 DIAGNOSIS — R7881 Bacteremia: Secondary | ICD-10-CM | POA: Diagnosis not present

## 2023-01-20 DIAGNOSIS — B9562 Methicillin resistant Staphylococcus aureus infection as the cause of diseases classified elsewhere: Secondary | ICD-10-CM | POA: Diagnosis not present

## 2023-01-20 DIAGNOSIS — E162 Hypoglycemia, unspecified: Secondary | ICD-10-CM | POA: Diagnosis not present

## 2023-01-21 DIAGNOSIS — E039 Hypothyroidism, unspecified: Secondary | ICD-10-CM | POA: Diagnosis not present

## 2023-01-21 DIAGNOSIS — K7689 Other specified diseases of liver: Secondary | ICD-10-CM | POA: Diagnosis not present

## 2023-01-21 DIAGNOSIS — C676 Malignant neoplasm of ureteric orifice: Secondary | ICD-10-CM | POA: Diagnosis not present

## 2023-01-21 DIAGNOSIS — C61 Malignant neoplasm of prostate: Secondary | ICD-10-CM | POA: Diagnosis not present

## 2023-01-21 DIAGNOSIS — K5909 Other constipation: Secondary | ICD-10-CM | POA: Diagnosis not present

## 2023-01-21 DIAGNOSIS — Z515 Encounter for palliative care: Secondary | ICD-10-CM | POA: Diagnosis not present

## 2023-01-21 DIAGNOSIS — I4891 Unspecified atrial fibrillation: Secondary | ICD-10-CM | POA: Diagnosis not present

## 2023-01-21 DIAGNOSIS — E162 Hypoglycemia, unspecified: Secondary | ICD-10-CM | POA: Diagnosis not present

## 2023-01-21 DIAGNOSIS — F0390 Unspecified dementia without behavioral disturbance: Secondary | ICD-10-CM | POA: Diagnosis not present

## 2023-01-21 DIAGNOSIS — N200 Calculus of kidney: Secondary | ICD-10-CM | POA: Diagnosis not present

## 2023-01-21 DIAGNOSIS — R410 Disorientation, unspecified: Secondary | ICD-10-CM | POA: Diagnosis not present

## 2023-01-21 DIAGNOSIS — B9562 Methicillin resistant Staphylococcus aureus infection as the cause of diseases classified elsewhere: Secondary | ICD-10-CM | POA: Diagnosis not present

## 2023-01-21 DIAGNOSIS — I10 Essential (primary) hypertension: Secondary | ICD-10-CM | POA: Diagnosis not present

## 2023-01-22 DIAGNOSIS — B9562 Methicillin resistant Staphylococcus aureus infection as the cause of diseases classified elsewhere: Secondary | ICD-10-CM | POA: Diagnosis not present

## 2023-01-22 DIAGNOSIS — J690 Pneumonitis due to inhalation of food and vomit: Secondary | ICD-10-CM | POA: Diagnosis not present

## 2023-01-22 DIAGNOSIS — R7881 Bacteremia: Secondary | ICD-10-CM | POA: Diagnosis not present

## 2023-01-22 DIAGNOSIS — R400 Somnolence: Secondary | ICD-10-CM | POA: Diagnosis not present

## 2023-01-22 DIAGNOSIS — Z8554 Personal history of malignant neoplasm of ureter: Secondary | ICD-10-CM | POA: Diagnosis not present

## 2023-01-22 DIAGNOSIS — E162 Hypoglycemia, unspecified: Secondary | ICD-10-CM | POA: Diagnosis not present

## 2023-01-22 DIAGNOSIS — C61 Malignant neoplasm of prostate: Secondary | ICD-10-CM | POA: Diagnosis not present

## 2023-01-23 DIAGNOSIS — E162 Hypoglycemia, unspecified: Secondary | ICD-10-CM | POA: Diagnosis not present

## 2023-02-13 DEATH — deceased
